# Patient Record
Sex: Female | Born: 1950
Health system: Southern US, Community
[De-identification: ages and names within clinical notes are randomized; demographics above are authoritative.]

## PROBLEM LIST (undated history)

## (undated) DIAGNOSIS — IMO0002 Reserved for concepts with insufficient information to code with codable children: Secondary | ICD-10-CM

## (undated) DIAGNOSIS — I10 Essential (primary) hypertension: Secondary | ICD-10-CM

## (undated) DIAGNOSIS — R1319 Other dysphagia: Secondary | ICD-10-CM

## (undated) DIAGNOSIS — E269 Hyperaldosteronism, unspecified: Secondary | ICD-10-CM

## (undated) DIAGNOSIS — Z91199 Patient's noncompliance with other medical treatment and regimen due to unspecified reason: Secondary | ICD-10-CM

## (undated) DIAGNOSIS — C159 Malignant neoplasm of esophagus, unspecified: Secondary | ICD-10-CM

## (undated) DIAGNOSIS — Z9119 Patient's noncompliance with other medical treatment and regimen: Secondary | ICD-10-CM

## (undated) DIAGNOSIS — E7800 Pure hypercholesterolemia, unspecified: Secondary | ICD-10-CM

## (undated) DIAGNOSIS — E876 Hypokalemia: Secondary | ICD-10-CM

## (undated) DIAGNOSIS — I639 Cerebral infarction, unspecified: Secondary | ICD-10-CM

## (undated) DIAGNOSIS — R454 Irritability and anger: Secondary | ICD-10-CM

## (undated) DIAGNOSIS — R569 Unspecified convulsions: Secondary | ICD-10-CM

## (undated) DIAGNOSIS — R928 Other abnormal and inconclusive findings on diagnostic imaging of breast: Secondary | ICD-10-CM

## (undated) DIAGNOSIS — IMO0001 Reserved for inherently not codable concepts without codable children: Secondary | ICD-10-CM

## (undated) HISTORY — DX: Other dysphagia: R13.19

## (undated) HISTORY — DX: Malignant neoplasm of esophagus, unspecified: C15.9

## (undated) HISTORY — DX: Reserved for concepts with insufficient information to code with codable children: IMO0002

## (undated) HISTORY — DX: Reserved for inherently not codable concepts without codable children: IMO0001

---

## 2014-01-31 ENCOUNTER — Encounter (HOSPITAL_BASED_OUTPATIENT_CLINIC_OR_DEPARTMENT_OTHER): Payer: Self-pay | Admitting: Emergency Medicine

## 2014-01-31 ENCOUNTER — Emergency Department (HOSPITAL_BASED_OUTPATIENT_CLINIC_OR_DEPARTMENT_OTHER)
Admission: EM | Admit: 2014-01-31 | Discharge: 2014-01-31 | Disposition: A | Discharge status: Home / Self Care | Payer: Medicare Other | Attending: Emergency Medicine | Admitting: Emergency Medicine

## 2014-01-31 DIAGNOSIS — Z8673 Personal history of transient ischemic attack (TIA), and cerebral infarction without residual deficits: Secondary | ICD-10-CM | POA: Diagnosis not present

## 2014-01-31 DIAGNOSIS — E876 Hypokalemia: Secondary | ICD-10-CM | POA: Diagnosis not present

## 2014-01-31 DIAGNOSIS — R1310 Dysphagia, unspecified: Secondary | ICD-10-CM | POA: Diagnosis present

## 2014-01-31 DIAGNOSIS — Z79899 Other long term (current) drug therapy: Secondary | ICD-10-CM | POA: Insufficient documentation

## 2014-01-31 DIAGNOSIS — I10 Essential (primary) hypertension: Secondary | ICD-10-CM | POA: Insufficient documentation

## 2014-01-31 DIAGNOSIS — Z7902 Long term (current) use of antithrombotics/antiplatelets: Secondary | ICD-10-CM | POA: Diagnosis not present

## 2014-01-31 DIAGNOSIS — Z720 Tobacco use: Secondary | ICD-10-CM | POA: Diagnosis not present

## 2014-01-31 DIAGNOSIS — B370 Candidal stomatitis: Secondary | ICD-10-CM | POA: Diagnosis not present

## 2014-01-31 DIAGNOSIS — Z7982 Long term (current) use of aspirin: Secondary | ICD-10-CM | POA: Insufficient documentation

## 2014-01-31 DIAGNOSIS — E780 Pure hypercholesterolemia: Secondary | ICD-10-CM | POA: Insufficient documentation

## 2014-01-31 DIAGNOSIS — B379 Candidiasis, unspecified: Secondary | ICD-10-CM

## 2014-01-31 HISTORY — DX: Cerebral infarction, unspecified: I63.9

## 2014-01-31 HISTORY — DX: Hypokalemia: E87.6

## 2014-01-31 HISTORY — DX: Essential (primary) hypertension: I10

## 2014-01-31 HISTORY — DX: Irritability and anger: R45.4

## 2014-01-31 HISTORY — DX: Pure hypercholesterolemia, unspecified: E78.00

## 2014-01-31 HISTORY — DX: Other abnormal and inconclusive findings on diagnostic imaging of breast: R92.8

## 2014-01-31 HISTORY — DX: Patient's noncompliance with other medical treatment and regimen due to unspecified reason: Z91.199

## 2014-01-31 HISTORY — DX: Patient's noncompliance with other medical treatment and regimen: Z91.19

## 2014-01-31 HISTORY — DX: Hyperaldosteronism, unspecified: E26.9

## 2014-01-31 LAB — CBG MONITORING, ED: GLUCOSE-CAPILLARY: 89 mg/dL (ref 70–99)

## 2014-01-31 MED ORDER — CLOTRIMAZOLE 10 MG MT TROC: 10.0000 mg | Freq: Three times a day (TID) | OROMUCOSAL | Status: DC

## 2014-01-31 NOTE — ED Provider Notes (Signed)
CSN: [id number]     Arrival date & time 01/31/14  1521 History   First MD Initiated Contact with Patient 01/31/14 1531     Chief Complaint  Patient presents with  . Sore Throat  . Dysphagia     (Consider location/radiation/quality/duration/timing/severity/associated sxs/prior Treatment) HPI Comments: Pt states that she has had difficulty with swallowing her solid food. Pt states that is burning on the right side. Denies history of reflux. No cough, fever or generalized sore throat. States that she swallows liquids without any problem  The history is provided by the patient. No language interpreter was used.    Past Medical History  Diagnosis Date  . Stroke   . Hypercholesteremia   . Hypertension   . Hypokalemia   . History of noncompliance with medical treatment   . High aldosterone   . Anger   . Mammogram abnormal    History reviewed. No pertinent past surgical history. No family history on file. History  Substance Use Topics  . Smoking status: Current Every Day Smoker  . Smokeless tobacco: Not on file  . Alcohol Use: Not on file   OB History   Grav Para Term Preterm Abortions TAB SAB Ect Mult Living                 Review of Systems  All other systems reviewed and are negative.     Allergies  Review of patient's allergies indicates not on file.  Home Medications   Prior to Admission medications   Medication Sig Start Date End Date Taking? Authorizing Provider  amLODipine (NORVASC) 10 MG tablet Take 10 mg by mouth daily.   Yes Historical Provider, MD  aspirin 325 MG tablet Take 325 mg by mouth daily.   Yes Historical Provider, MD  atorvastatin (LIPITOR) 40 MG tablet Take 40 mg by mouth daily.   Yes Historical Provider, MD  clopidogrel (PLAVIX) 75 MG tablet Take 75 mg by mouth daily.   Yes Historical Provider, MD  lisinopril (PRINIVIL,ZESTRIL) 40 MG tablet Take 40 mg by mouth daily.   Yes Historical Provider, MD  magnesium gluconate (MAGONATE) 500 MG tablet  Take 500 mg by mouth 2 (two) times daily.   Yes Historical Provider, MD  potassium chloride 40 MEQ/15ML (20%) LIQD Take 40 mEq by mouth 2 (two) times daily.   Yes Historical Provider, MD   BP 160/74  Pulse 84  Temp(Src) 98.9 F (37.2 C)  Resp 16  Ht 5\' 6"  (1.676 m)  Wt 163 lb (73.936 kg)  BMI 26.32 kg/m2  SpO2 97% Physical Exam  Nursing note and vitals reviewed. Constitutional: She is oriented to person, place, and time. She appears well-developed and well-nourished.  HENT:  Right Ear: External ear normal.  Left Ear: External ear normal.  White coating noted to the tongue with redness and inflammation noted on the right side posteriorly  Eyes: EOM are normal.  Cardiovascular: Normal rate and regular rhythm.   Pulmonary/Chest: Effort normal and breath sounds normal.  Musculoskeletal: Normal range of motion.  Neurological: She is alert and oriented to person, place, and time.    ED Course  Procedures (including critical care time) Labs Review Labs Reviewed  CBG MONITORING, ED    Imaging Review No results found.   EKG Interpretation None      MDM   Final diagnoses:  Candida infection    Exam consistent with candida. Blood sugar wnl. Discussed with pt and sister that this is unusual and she needs to be  rechecked. Could be related to poor oral care    Glendell Docker, NP 01/31/14 1623

## 2014-01-31 NOTE — ED Notes (Signed)
Pt states she has been having difficulty with swallowing solid foods.  No difficulty with fluids.  She relates she has burning sensation with eating.

## 2014-01-31 NOTE — Discharge Instructions (Signed)
Follow up with your doctor for continued or worsening symptoms, but also to make sure that the symptoms are resolving   Candida Infection A Candida infection (also called yeast, fungus, and Monilia infection) is an overgrowth of yeast that can occur anywhere on the body. A yeast infection commonly occurs in warm, moist body areas. Usually, the infection remains localized but can spread to become a systemic infection. A yeast infection may be a sign of a more severe disease such as diabetes, leukemia, or AIDS. A yeast infection can occur in both men and women. In women, Candida vaginitis is a vaginal infection. It is one of the most common causes of vaginitis. Men usually do not have symptoms or know they have an infection until other problems develop. Men may find out they have a yeast infection because their sex partner has a yeast infection. Uncircumcised men are more likely to get a yeast infection than circumcised men. This is because the uncircumcised glans is not exposed to air and does not remain as dry as that of a circumcised glans. Older adults may develop yeast infections around dentures. CAUSES  Women  Antibiotics.  Steroid medication taken for a long time.  Being overweight (obese).  Diabetes.  Poor immune condition.  Certain serious medical conditions.  Immune suppressive medications for organ transplant patients.  Chemotherapy.  Pregnancy.  Menstruation.  Stress and fatigue.  Intravenous drug use.  Oral contraceptives.  Wearing tight-fitting clothes in the crotch area.  Catching it from a sex partner who has a yeast infection.  Spermicide.  Intravenous, urinary, or other catheters. Men  Catching it from a sex partner who has a yeast infection.  Having oral or anal sex with a person who has the infection.  Spermicide.  Diabetes.  Antibiotics.  Poor immune system.  Medications that suppress the immune system.  Intravenous drug  use.  Intravenous, urinary, or other catheters. SYMPTOMS  Women  Thick, white vaginal discharge.  Vaginal itching.  Redness and swelling in and around the vagina.  Irritation of the lips of the vagina and perineum.  Blisters on the vaginal lips and perineum.  Painful sexual intercourse.  Low blood sugar (hypoglycemia).  Painful urination.  Bladder infections.  Intestinal problems such as constipation, indigestion, bad breath, bloating, increase in gas, diarrhea, or loose stools. Men  Men may develop intestinal problems such as constipation, indigestion, bad breath, bloating, increase in gas, diarrhea, or loose stools.  Dry, cracked skin on the penis with itching or discomfort.  Jock itch.  Dry, flaky skin.  Athlete's foot.  Hypoglycemia. DIAGNOSIS  Women  A history and an exam are performed.  The discharge may be examined under a microscope.  A culture may be taken of the discharge. Men  A history and an exam are performed.  Any discharge from the penis or areas of cracked skin will be looked at under the microscope and cultured.  Stool samples may be cultured. TREATMENT  Women  Vaginal antifungal suppositories and creams.  Medicated creams to decrease irritation and itching on the outside of the vagina.  Warm compresses to the perineal area to decrease swelling and discomfort.  Oral antifungal medications.  Medicated vaginal suppositories or cream for repeated or recurrent infections.  Wash and dry the irritation areas before applying the cream.  Eating yogurt with Lactobacillus may help with prevention and treatment.  Sometimes painting the vagina with gentian violet solution may help if creams and suppositories do not work. Men  Antifungal creams and  oral antifungal medications.  Sometimes treatment must continue for 30 days after the symptoms go away to prevent recurrence. HOME CARE INSTRUCTIONS  Women  Use cotton underwear and avoid  tight-fitting clothing.  Avoid colored, scented toilet paper and deodorant tampons or pads.  Do not douche.  Keep your diabetes under control.  Finish all the prescribed medications.  Keep your skin clean and dry.  Consume milk or yogurt with Lactobacillus-active culture regularly. If you get frequent yeast infections and think that is what the infection is, there are over-the-counter medications that you can get. If the infection does not show healing in 3 days, talk to your caregiver.  Tell your sex partner you have a yeast infection. Your partner may need treatment also, especially if your infection does not clear up or recurs. Men  Keep your skin clean and dry.  Keep your diabetes under control.  Finish all prescribed medications.  Tell your sex partner that you have a yeast infection so he or she can be treated if necessary. SEEK MEDICAL CARE IF:   Your symptoms do not clear up or worsen in one week after treatment.  You have an oral temperature above 102 F (38.9 C).  You have trouble swallowing or eating for a prolonged time.  You develop blisters on and around your vagina.  You develop vaginal bleeding and it is not your menstrual period.  You develop abdominal pain.  You develop intestinal problems as mentioned above.  You get weak or light-headed.  You have painful or increased urination.  You have pain during sexual intercourse. MAKE SURE YOU:   Understand these instructions.  Will watch your condition.  Will get help right away if you are not doing well or get worse. Document Released: 05/02/2004 Document Revised: 08/09/2013 Document Reviewed: 08/14/2009 [facility] Patient Information 2015 [place], [place]. This information is not intended to replace advice given to you by your health care provider. Make sure you discuss any questions you have with your health care provider.

## 2014-02-01 NOTE — ED Provider Notes (Signed)
Medical screening examination/treatment/procedure(s) were performed by non-physician practitioner and as supervising physician I was immediately available for consultation/collaboration.   EKG Interpretation None        Pamella Pert, MD 02/01/14 1146

## 2014-04-06 ENCOUNTER — Encounter: Payer: Self-pay | Admitting: Hematology & Oncology

## 2014-04-07 ENCOUNTER — Encounter: Payer: Self-pay | Admitting: Hematology & Oncology

## 2014-04-07 ENCOUNTER — Other Ambulatory Visit (HOSPITAL_BASED_OUTPATIENT_CLINIC_OR_DEPARTMENT_OTHER): Payer: Medicare Other | Admitting: Lab

## 2014-04-07 ENCOUNTER — Encounter (HOSPITAL_COMMUNITY): Payer: Self-pay

## 2014-04-07 ENCOUNTER — Ambulatory Visit: Payer: Medicare Other

## 2014-04-07 ENCOUNTER — Other Ambulatory Visit: Payer: Self-pay

## 2014-04-07 ENCOUNTER — Inpatient Hospital Stay (HOSPITAL_COMMUNITY)
Admission: AD | Admit: 2014-04-07 | Discharge: 2014-04-19 | DRG: 846 | Disposition: A | Discharge status: Nursing Home (Includes ICF) | Payer: Medicare Other | Source: Ambulatory Visit | Attending: Hematology & Oncology | Admitting: Hematology & Oncology

## 2014-04-07 ENCOUNTER — Ambulatory Visit (HOSPITAL_BASED_OUTPATIENT_CLINIC_OR_DEPARTMENT_OTHER): Payer: Medicare Other | Admitting: Hematology & Oncology

## 2014-04-07 VITALS — BP 126/77 | HR 63 | Temp 98.8°F | Resp 16 | Ht 66.0 in | Wt 141.0 lb

## 2014-04-07 DIAGNOSIS — Z7982 Long term (current) use of aspirin: Secondary | ICD-10-CM | POA: Diagnosis not present

## 2014-04-07 DIAGNOSIS — F1010 Alcohol abuse, uncomplicated: Secondary | ICD-10-CM | POA: Diagnosis present

## 2014-04-07 DIAGNOSIS — C159 Malignant neoplasm of esophagus, unspecified: Secondary | ICD-10-CM | POA: Insufficient documentation

## 2014-04-07 DIAGNOSIS — E440 Moderate protein-calorie malnutrition: Secondary | ICD-10-CM | POA: Diagnosis present

## 2014-04-07 DIAGNOSIS — Z8673 Personal history of transient ischemic attack (TIA), and cerebral infarction without residual deficits: Secondary | ICD-10-CM | POA: Diagnosis not present

## 2014-04-07 DIAGNOSIS — G9340 Encephalopathy, unspecified: Secondary | ICD-10-CM | POA: Diagnosis present

## 2014-04-07 DIAGNOSIS — E860 Dehydration: Secondary | ICD-10-CM | POA: Diagnosis present

## 2014-04-07 DIAGNOSIS — F17210 Nicotine dependence, cigarettes, uncomplicated: Secondary | ICD-10-CM | POA: Diagnosis present

## 2014-04-07 DIAGNOSIS — E43 Unspecified severe protein-calorie malnutrition: Secondary | ICD-10-CM

## 2014-04-07 DIAGNOSIS — Z7902 Long term (current) use of antithrombotics/antiplatelets: Secondary | ICD-10-CM | POA: Diagnosis not present

## 2014-04-07 DIAGNOSIS — D72829 Elevated white blood cell count, unspecified: Secondary | ICD-10-CM | POA: Diagnosis present

## 2014-04-07 DIAGNOSIS — D539 Nutritional anemia, unspecified: Secondary | ICD-10-CM | POA: Diagnosis present

## 2014-04-07 DIAGNOSIS — D630 Anemia in neoplastic disease: Secondary | ICD-10-CM | POA: Diagnosis present

## 2014-04-07 DIAGNOSIS — K769 Liver disease, unspecified: Secondary | ICD-10-CM

## 2014-04-07 DIAGNOSIS — E780 Pure hypercholesterolemia: Secondary | ICD-10-CM | POA: Diagnosis present

## 2014-04-07 DIAGNOSIS — Z79899 Other long term (current) drug therapy: Secondary | ICD-10-CM | POA: Diagnosis not present

## 2014-04-07 DIAGNOSIS — R627 Adult failure to thrive: Secondary | ICD-10-CM | POA: Diagnosis present

## 2014-04-07 DIAGNOSIS — Z87891 Personal history of nicotine dependence: Secondary | ICD-10-CM | POA: Diagnosis not present

## 2014-04-07 DIAGNOSIS — Z9119 Patient's noncompliance with other medical treatment and regimen: Secondary | ICD-10-CM | POA: Diagnosis present

## 2014-04-07 DIAGNOSIS — R1310 Dysphagia, unspecified: Secondary | ICD-10-CM | POA: Diagnosis present

## 2014-04-07 DIAGNOSIS — I10 Essential (primary) hypertension: Secondary | ICD-10-CM | POA: Diagnosis present

## 2014-04-07 DIAGNOSIS — K222 Esophageal obstruction: Secondary | ICD-10-CM | POA: Diagnosis present

## 2014-04-07 DIAGNOSIS — Z6822 Body mass index (BMI) 22.0-22.9, adult: Secondary | ICD-10-CM

## 2014-04-07 DIAGNOSIS — Z5111 Encounter for antineoplastic chemotherapy: Principal | ICD-10-CM

## 2014-04-07 DIAGNOSIS — D473 Essential (hemorrhagic) thrombocythemia: Secondary | ICD-10-CM | POA: Diagnosis present

## 2014-04-07 DIAGNOSIS — R1319 Other dysphagia: Secondary | ICD-10-CM

## 2014-04-07 DIAGNOSIS — Z931 Gastrostomy status: Secondary | ICD-10-CM | POA: Diagnosis not present

## 2014-04-07 DIAGNOSIS — E876 Hypokalemia: Secondary | ICD-10-CM | POA: Diagnosis present

## 2014-04-07 HISTORY — DX: Malignant neoplasm of esophagus, unspecified: C15.9

## 2014-04-07 HISTORY — DX: Other dysphagia: R13.19

## 2014-04-07 LAB — CBC WITH DIFFERENTIAL (CANCER CENTER ONLY)
BASO#: 0.1 10*3/uL (ref 0.0–0.2)
BASO%: 0.3 % (ref 0.0–2.0)
EOS ABS: 0.7 10*3/uL — AB (ref 0.0–0.5)
EOS%: 3.2 % (ref 0.0–7.0)
HCT: 32.9 % — ABNORMAL LOW (ref 34.8–46.6)
HEMOGLOBIN: 10.8 g/dL — AB (ref 11.6–15.9)
LYMPH#: 2.5 10*3/uL (ref 0.9–3.3)
LYMPH%: 11.9 % — ABNORMAL LOW (ref 14.0–48.0)
MCH: 28.1 pg (ref 26.0–34.0)
MCHC: 32.8 g/dL (ref 32.0–36.0)
MCV: 86 fL (ref 81–101)
MONO#: 1.3 10*3/uL — ABNORMAL HIGH (ref 0.1–0.9)
MONO%: 6.1 % (ref 0.0–13.0)
NEUT%: 78.5 % (ref 39.6–80.0)
NEUTROS ABS: 16.4 10*3/uL — AB (ref 1.5–6.5)
Platelets: 466 10*3/uL — ABNORMAL HIGH (ref 145–400)
RBC: 3.84 10*6/uL (ref 3.70–5.32)
RDW: 14.7 % (ref 11.1–15.7)
WBC: 20.9 10*3/uL — ABNORMAL HIGH (ref 3.9–10.0)

## 2014-04-07 LAB — COMPREHENSIVE METABOLIC PANEL
ALBUMIN: 3.6 g/dL (ref 3.5–5.2)
ALT: <8 U/L (ref 0–35)
AST: 10 U/L (ref 0–37)
Alkaline Phosphatase: 59 U/L (ref 39–117)
BUN: 15 mg/dL (ref 6–23)
CO2: 39 meq/L — AB (ref 19–32)
Calcium: 10.7 mg/dL — ABNORMAL HIGH (ref 8.4–10.5)
Chloride: 94 mEq/L — ABNORMAL LOW (ref 96–112)
Creatinine, Ser: 0.97 mg/dL (ref 0.50–1.10)
GLUCOSE: 125 mg/dL — AB (ref 70–99)
Potassium: 2.5 mEq/L — CL (ref 3.5–5.3)
SODIUM: 143 meq/L (ref 135–145)
TOTAL PROTEIN: 7.3 g/dL (ref 6.0–8.3)
Total Bilirubin: 0.5 mg/dL (ref 0.2–1.2)

## 2014-04-07 LAB — PREALBUMIN: Prealbumin: 15.2 mg/dL — ABNORMAL LOW (ref 17.0–34.0)

## 2014-04-07 LAB — FERRITIN CHCC: Ferritin: 345 ng/ml — ABNORMAL HIGH (ref 9–269)

## 2014-04-07 LAB — IRON AND TIBC CHCC
%SAT: 14 % — ABNORMAL LOW (ref 21–57)
Iron: 33 ug/dL — ABNORMAL LOW (ref 41–142)
TIBC: 235 ug/dL — AB (ref 236–444)
UIBC: 202 ug/dL (ref 120–384)

## 2014-04-07 LAB — RETICULOCYTES (CHCC)
ABS RETIC: 43.3 10*3/uL (ref 19.0–186.0)
RBC.: 3.94 MIL/uL (ref 3.87–5.11)
Retic Ct Pct: 1.1 % (ref 0.4–2.3)

## 2014-04-07 MED ORDER — DEXTROSE-NACL 5-0.9 % IV SOLN
INTRAVENOUS | Status: DC
  Administered 2014-04-07: 18:00:00 via INTRAVENOUS

## 2014-04-07 MED ORDER — BOOST / RESOURCE BREEZE PO LIQD
1.0000 | Freq: Three times a day (TID) | ORAL | Status: DC
  Administered 2014-04-07 – 2014-04-11 (×10): 1 via ORAL

## 2014-04-07 MED ORDER — MAGNESIUM GLUCONATE 500 MG PO TABS
500.0000 mg | ORAL_TABLET | Freq: Two times a day (BID) | ORAL | Status: DC
  Administered 2014-04-07 – 2014-04-18 (×20): 500 mg via ORAL
  Filled 2014-04-07 (×23): qty 1

## 2014-04-07 MED ORDER — KCL IN DEXTROSE-NACL 40-5-0.9 MEQ/L-%-% IV SOLN
INTRAVENOUS | Status: DC
  Administered 2014-04-07: 21:00:00 via INTRAVENOUS
  Administered 2014-04-08: 1000 mL via INTRAVENOUS
  Administered 2014-04-09 – 2014-04-16 (×9): via INTRAVENOUS
  Administered 2014-04-17: 1000 mL via INTRAVENOUS
  Filled 2014-04-07 (×21): qty 1000

## 2014-04-07 MED ORDER — AMLODIPINE BESYLATE 10 MG PO TABS
10.0000 mg | ORAL_TABLET | Freq: Every day | ORAL | Status: DC
  Administered 2014-04-07 – 2014-04-19 (×13): 10 mg via ORAL
  Filled 2014-04-07 (×13): qty 1

## 2014-04-07 MED ORDER — THIAMINE HCL 100 MG/ML IJ SOLN
100.0000 mg | Freq: Every day | INTRAMUSCULAR | Status: DC
  Administered 2014-04-07 – 2014-04-09 (×3): 100 mg via INTRAVENOUS
  Filled 2014-04-07 (×3): qty 1

## 2014-04-07 MED ORDER — ENOXAPARIN SODIUM 40 MG/0.4ML ~~LOC~~ SOLN
40.0000 mg | SUBCUTANEOUS | Status: DC
  Administered 2014-04-07 – 2014-04-18 (×9): 40 mg via SUBCUTANEOUS
  Filled 2014-04-07 (×13): qty 0.4

## 2014-04-07 MED ORDER — POTASSIUM CHLORIDE 20 MEQ/15ML (10%) PO SOLN
20.0000 meq | Freq: Two times a day (BID) | ORAL | Status: DC
  Administered 2014-04-07: 20 meq via ORAL
  Filled 2014-04-07 (×3): qty 15

## 2014-04-07 MED ORDER — FOLIC ACID 5 MG/ML IJ SOLN
1.0000 mg | Freq: Every day | INTRAMUSCULAR | Status: DC
  Administered 2014-04-07 – 2014-04-09 (×3): 1 mg via INTRAVENOUS
  Filled 2014-04-07 (×4): qty 0.2

## 2014-04-07 MED ORDER — ATORVASTATIN CALCIUM 40 MG PO TABS
40.0000 mg | ORAL_TABLET | Freq: Every day | ORAL | Status: DC
  Administered 2014-04-07 – 2014-04-18 (×10): 40 mg via ORAL
  Filled 2014-04-07 (×13): qty 1

## 2014-04-07 MED ORDER — POTASSIUM CHLORIDE 40 MEQ/15ML (20%) PO LIQD: 20.0000 meq | Freq: Two times a day (BID) | ORAL | Status: DC

## 2014-04-07 MED ORDER — PANTOPRAZOLE SODIUM 40 MG IV SOLR
40.0000 mg | INTRAVENOUS | Status: DC
  Administered 2014-04-07 – 2014-04-08 (×2): 40 mg via INTRAVENOUS
  Filled 2014-04-07 (×3): qty 40

## 2014-04-07 MED ORDER — SODIUM CHLORIDE 0.9 % IV SOLN
1020.0000 mg | Freq: Once | INTRAVENOUS | Status: AC
  Administered 2014-04-07: 1020 mg via INTRAVENOUS
  Filled 2014-04-07: qty 34

## 2014-04-07 MED ORDER — OXYCODONE HCL 20 MG/ML PO CONC: 5.0000 mg | ORAL | Status: DC | PRN

## 2014-04-07 MED ORDER — LISINOPRIL 40 MG PO TABS
40.0000 mg | ORAL_TABLET | Freq: Every day | ORAL | Status: DC
  Administered 2014-04-07 – 2014-04-19 (×13): 40 mg via ORAL
  Filled 2014-04-07 (×13): qty 1

## 2014-04-07 NOTE — H&P (Signed)
[facility]  [phone]    ADMISSION NOTE  Admitting MD:  Allen Kell  Attending MD:  Allen Kell  Reason for Admission: Esophageal Cancer  HPI: Sheila Young is an 63 y.o. female with a new diagnosis of Squamous Cell Carcinoma of the Esophagus being admitted for initiation of treatment as patient is symptomatic for severe dysphagia.  In review, she presented to her physician with a two-month history of progressive dysphagia,especially with solids, with subsequent 25 pound weight loss. She has been able to tolerate liquids. She has increase saliva production. She denies any vomiting, but she does report intermittent nausea She denies any shortness of breath, cough or cardiac complaints. No bleeding issues such as hemoptysis or hematemesis. She denies any pain at this time. She denies any fever. he patient has a history of Prior CVA,Tobacco and alcohol abuse. She was seen by ENT there referred for upper endoscopy, remarkable for a GI mass seen at the esophagus. EGD on December 23 showed this mass to be 30 cm down to the EG junction. Biopsies were taken, and the Esophagus was dilated. Pathology reports, case number [id number] was consistent with squamous cell carcinoma. She then was referred to the office of Dr. Marin Olp the [facility] at [facility] for further evaluation. He recommended that the patient be admitted for appropriate staging studies, feeding tube placement and lab work. Her ECOG is 2  PMH:  Past Medical History  Diagnosis Date  . Stroke   . Hypercholesteremia   . Hypertension   . Hypokalemia   . History of noncompliance with medical treatment   . High aldosterone   . Anger   . Mammogram abnormal   . Esophagus cancer 04/07/2014  . Mechanical dysphagia 04/07/2014    Surgeries:  No past surgical history on file.  Allergies: No Known Allergies  Medications:   Prior to Admission:  Prescriptions prior to admission    Medication Sig Dispense Refill Last Dose  . amLODipine (NORVASC) 10 MG tablet Take 10 mg by mouth daily.   Taking  . aspirin 325 MG tablet Take 325 mg by mouth daily.   Taking  . atorvastatin (LIPITOR) 40 MG tablet Take 40 mg by mouth daily.   Taking  . clopidogrel (PLAVIX) 75 MG tablet Take 75 mg by mouth daily.   Taking  . lisinopril (PRINIVIL,ZESTRIL) 40 MG tablet Take 40 mg by mouth daily.   Taking  . magnesium gluconate (MAGONATE) 500 MG tablet Take 500 mg by mouth 2 (two) times daily.   Taking  . potassium chloride 40 MEQ/15ML (20%) LIQD Take 40 mEq by mouth 2 (two) times daily.   Taking    PRN:    KZS:WFUXNA of Systems  Constitutional: Positive for 25 pound weight loss. Negative for fever, chills and positive for malaise/fatigue.  Eyes: Negative for blurred vision and double vision.  Respiratory: Negative for cough, hemoptysis orshortness of breath.  Cardiovascular: Negativefor chest pain. GI: She reports intermittent nausea without vomiting, diarrhea or constipation. No change in bowel caliber. No Melena or Hematochezia.  GU: No blood in urine. No loss of urinary control. Skin: Negative for itching. No rash. No petechia. No bruising. Neurological: No headaches. No motor or sensory deficits. She has a history of CVA without residual Review of all other organs systems are otherwise negative  Family History:  No family history on file.  Social History:  reports that she has been smoking Cigarettes.  She started smoking about 45 years ago. She  has a 22.5 pack-year smoking history. She has never used smokeless tobacco. Antigen 1 week ago she drunk heavily current and beer.  Physical Exam:   There were no vitals filed for this visit.  63 y.o. female  in no acute distress, alert, Non conversant due to symptoms, which include increased salivary production. She appears very anxious General well-developed,thin HEENT: Normocephalic, atraumatic, PERRLA. Oral cavity without thrush or  lesions. Neck supple. no thyromegaly, no cervical or supraclavicular adenopathy  Lungs clear bilaterally . No wheezing, rhonchi or rales. Breasts: not examined. Cardiac regular rate and rhythm, no murmur,no rubs or gallops. Occasional ectopic beat Abdomen soft nontender,bowel sounds x4. No organomegaly GU/rectal: deferred. Extremities no clubbing cyanosis or edema. No bruising or petechial rash Neuro: non focal    LABS: CBC   Recent Labs Lab 04/07/14 1310  WBC 20.9*  HGB 10.8*  HCT 32.9*  PLT 466*  MCV 86  MCH 28.1  MCHC 32.8  RDW 14.7  LYMPHSABS 2.5  EOSABS 0.7*  BASOSABS 0.1     Anemia panel:  No results for input(s): VITAMINB12, FOLATE, FERRITIN, TIBC, IRON, RETICCTPCT in the last 72 hours.  No results for input(s): TSH, T4TOTAL, T3FREE, THYROIDAB in the last 72 hours.  Invalid input(s): FREET3   No results found for: ESRSEDRATE    CMP   No results for input(s): NA, K, CL, CO2, GLUCOSE, BUN, CREATININE, CALCIUM, MG, AST, ALT, ALKPHOS, BILITOT in the last 168 hours.  Invalid input(s): GFRCGP   No results found for: BILITOT, BILIDIR, IBILI    No results for input(s): INR, PROTIME in the last 168 hours.  No results for input(s): DDIMER in the last 72 hours.    Imaging Studies: No results found.      A/P: 63 y.o. female  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 Dysphagia The patient has been recently diagnosed, she presented with dysphagia which required further workup.  EGD demonstrated an esophageal mass, for which biopsies were taken. This was consistent with squamous cell carcinoma. She is being admitted for further workup, which will include a CT of the chest, abdomen and pelvis, Possible MRI of the brain. PET scan will be performed at an outpatient. The plan is to place a Port-A-Cath, feeding tube, in preparation for combined planned chemotherapy and radiation Will need interventional radiology evaluation We will check a CBC in  the morning, B Met, may need tumor markers as well  3. Malnutrition Secondary to #1 #2 Consider nutrition evaluation while in the hospital the patient has lost 25 pounds in only 2 months  4.Leukocytosis Secondary to pain, inflammation, dehydration and history of tobacco abuse.  The patient is afebrile We will provide supportive care at this time with IV fluids, pain meds Tobacco cessation is recommended  5. Anemia Secondary to malnutrition, malignancy No bleeding issues are noted No transfusion is indicated at this time We will consider anemia panel  6. Thrombocytosis Likely reactive, in the setting of anemia and malignancy history of CVA, in the form of cerebral artery occlusion.This was on 07/28/2013 At [facility];. We will try to obtain documents to further evaluate, as the patient is experiencing thrombocytosis, and to prevent further events Platelet count is 466,000 at this time Will hydrate, and monitor closely The patient was on aspirin until admission, will continue med The patient will be placed on DVT prophylaxis with Lovenox upon admission  7. History of Alcohol abuse The patient has not had consumption for about 1 week due to  these symptoms Monitor for DT She will receive Thiamine and Folic Acid.   8. Full code   [facility] E 04/07/2014 3:38 PM   ADDENDUM:  I saw and examined the patient. I agree with the above.  We will have to see what the scans show.  I don't know if radiology or if GI can put the feeding tube in for [place].    She will also be a Port-A-Cath. I suppose this probably will not can put in until Monday.  She feels better right now. I think the obvious fluids are helping.  She does have some thrombocytosis. I suspect that she probably has some iron deficiency.  Once we get the staging studies done, then we will know how we can best treat this.  She just is not able to do the workup or procedures as an outpatient because she is  somewhat feeble.  She, and her family, appreciate the wonderful care that she has gotten so far. They have told us that they are not used to such personal attention that they are getting from the staff on [place].

## 2014-04-07 NOTE — Progress Notes (Signed)
Referral MD  Reason for Referral: Squamous cell carcinoma of the esophagus   Chief Complaint  Patient presents with  . NEW PATIENT  : She does not know why she is here  HPI: Mrs. Clarida is a very nice but very emotional 63 year old African-American female. She has had progressive dysphagia over the past 2 months. She came in with some of her family. Her 3 sisters were with her. Mrs. Alcorta really will not talk much area and she has a lot of saliva production.  She's lost about 25 pounds over a two-month period. She was not really able to enjoy food over Thanksgiving or Christmas.  She does have a history of heavy tobacco use. She really is not saying how much she smokes. I would have to say that she probably has at least a 50-pack-year history of tobacco use.  She also has a history of alcohol use. She does drink heavy liquor and beer. Last time she had a drink that her family thinks was about a week ago.  She was seen by gastroenterology in [facility]. Dr. [place] did an upper endoscopy on her. Again this was the cause of her dysphagia. He had done an upper GI on her. The upper GI showed a mass in the esophagus.  An EGD was subsequently done. This was done on December 23. There was a mass from 30 cm down to the EG junction. The scope was able to be passed through. Biopsies were taken. The esophagus was dilated.  The pathology report ([id number]) showed that she had a squamous cell carcinoma.  No additional testing has been done.  She is able to take in some liquids. It does not sound like she taking any solids.  I think she probably is going to have to be admitted so that she can get a feeding tube placed and is appropriate staging studies done.  She has had a past history of CVA. Her family thinks last stroke was about 10 years ago.  There's been no bleeding. She's had no hemoptysis or hematemesis. She is not complaining of any pain. There is no fever. She's had no cough.  She's had no vomiting. She has had some nausea.  Currently, her performance status is ECOG 2.             Past Medical History  Diagnosis Date  . Stroke   . Hypercholesteremia   . Hypertension   . Hypokalemia   . History of noncompliance with medical treatment   . High aldosterone   . Anger   . Mammogram abnormal   :  No past surgical history on file.:  Current outpatient prescriptions: amLODipine (NORVASC) 10 MG tablet, Take 10 mg by mouth daily., Disp: , Rfl: ;  aspirin 325 MG tablet, Take 325 mg by mouth daily., Disp: , Rfl: ;  atorvastatin (LIPITOR) 40 MG tablet, Take 40 mg by mouth daily., Disp: , Rfl: ;  clopidogrel (PLAVIX) 75 MG tablet, Take 75 mg by mouth daily., Disp: , Rfl: ;  lisinopril (PRINIVIL,ZESTRIL) 40 MG tablet, Take 40 mg by mouth daily., Disp: , Rfl:  magnesium gluconate (MAGONATE) 500 MG tablet, Take 500 mg by mouth 2 (two) times daily., Disp: , Rfl: ;  potassium chloride 40 MEQ/15ML (20%) LIQD, Take 40 mEq by mouth 2 (two) times daily., Disp: , Rfl: :  :  No Known Allergies:  No family history on file.:  History   Social History  . Marital Status: Single  Spouse Name: N/A    Number of Children: N/A  . Years of Education: N/A   Occupational History  . Not on file.   Social History Main Topics  . Smoking status: Current Every Day Smoker -- 0.50 packs/day for 45 years    Types: Cigarettes    Start date: 06/05/1968  . Smokeless tobacco: Never Used     Comment: 03-2014 still smoking  . Alcohol Use: Not on file  . Drug Use: Not on file  . Sexual Activity: Not on file   Other Topics Concern  . Not on file   Social History Narrative  :  Pertinent items are noted in HPI.  Exam: @IPVITALS @  thin but fairly well-nourished [place] American female in no obvious distress. Vital signs show temperature of 98.8. Pulse 83. Blood pressure 126/77. Weight is 141 pounds. Head and neck exam shows no ocular or oral lesions. There are no palpable  cervical or supraclavicular lymph nodes. Lungs are clear bilaterally. She has no rales, wheezes or rhonchi. Cardiac exam regular rate and rhythm. She has occasional extra beat. There are no murmurs, rubs or bruits. Abdomen is soft. She has a keloid formation in the central midline. She will not say how she got this. There is no palpable liver or spleen tip. Back exam no tenderness over the spine, ribs or hips. Extremities shows no clubbing, cyanosis or edema. She has decent strength in her extremities. Skin exam is slightly dry.    Recent Labs  04/07/14 1310  WBC 20.9*  HGB 10.8*  HCT 32.9*  PLT 466*   No results for input(s): NA, K, CL, CO2, GLUCOSE, BUN, CREATININE, CALCIUM in the last 72 hours.  Blood smear review: None  Pathology: See above     Assessment and Plan: Ms. Chasen is a 63 year old African-American female with squamous cell carcinoma esophagus. Again, we have no idea what the stages. She seems to be having a hard time eating.  I think she will need to have a feeding tube placed.  I would have to think that she probably has locally advanced disease. I suppose that she may have metastatic disease.  Again, she probably needs to be admitted. Once she is admitted, that we can do the appropriate staging studies with CT scan and possible MRI. We cannot do a PET scan as an inpatient.  She will be a feeding tube. She will need a Port-A-Cath.  As far as therapy goes, I suspect that combined chemotherapy and radiation therapy will be indicated.  I spent about an hour with Mrs. Stys and her family. They understand why she needs to be admitted. They understand why we need to do the appropriate staging studies. They understand why she needs a feeding tube.  We will plan to get her back to see [place] once her inpatient hospitalization is complete.

## 2014-04-08 ENCOUNTER — Inpatient Hospital Stay (HOSPITAL_COMMUNITY): Payer: Medicare Other

## 2014-04-08 DIAGNOSIS — R1319 Other dysphagia: Secondary | ICD-10-CM

## 2014-04-08 DIAGNOSIS — D649 Anemia, unspecified: Secondary | ICD-10-CM

## 2014-04-08 DIAGNOSIS — C159 Malignant neoplasm of esophagus, unspecified: Secondary | ICD-10-CM

## 2014-04-08 DIAGNOSIS — D72829 Elevated white blood cell count, unspecified: Secondary | ICD-10-CM

## 2014-04-08 DIAGNOSIS — E46 Unspecified protein-calorie malnutrition: Secondary | ICD-10-CM

## 2014-04-08 DIAGNOSIS — D473 Essential (hemorrhagic) thrombocythemia: Secondary | ICD-10-CM

## 2014-04-08 LAB — COMPREHENSIVE METABOLIC PANEL
ALBUMIN: 3.3 g/dL — AB (ref 3.5–5.2)
ALT: 9 U/L (ref 0–35)
AST: 13 U/L (ref 0–37)
Alkaline Phosphatase: 62 U/L (ref 39–117)
Anion gap: 9 (ref 5–15)
BUN: 9 mg/dL (ref 6–23)
CHLORIDE: 95 meq/L — AB (ref 96–112)
CO2: 36 mmol/L — ABNORMAL HIGH (ref 19–32)
Calcium: 9.4 mg/dL (ref 8.4–10.5)
Creatinine, Ser: 0.68 mg/dL (ref 0.50–1.10)
GFR calc Af Amer: >90 mL/min (ref >90)
GFR calc non Af Amer: >90 mL/min (ref >90)
Glucose, Bld: 125 mg/dL — ABNORMAL HIGH (ref 70–99)
Potassium: <2 mmol/L — CL (ref 3.5–5.1)
Sodium: 140 mmol/L (ref 135–145)
Total Bilirubin: 1.1 mg/dL (ref 0.3–1.2)
Total Protein: 6.9 g/dL (ref 6.0–8.3)

## 2014-04-08 LAB — BASIC METABOLIC PANEL
ANION GAP: 10 (ref 5–15)
BUN: 8 mg/dL (ref 6–23)
CALCIUM: 9.6 mg/dL (ref 8.4–10.5)
CO2: 36 mmol/L — ABNORMAL HIGH (ref 19–32)
Chloride: 96 mEq/L (ref 96–112)
Creatinine, Ser: 0.65 mg/dL (ref 0.50–1.10)
GFR calc non Af Amer: >90 mL/min (ref >90)
Glucose, Bld: 147 mg/dL — ABNORMAL HIGH (ref 70–99)
Sodium: 142 mmol/L (ref 135–145)

## 2014-04-08 LAB — LIPID PANEL
CHOL/HDL RATIO: 4.5 ratio
Cholesterol: 113 mg/dL (ref 0–200)
HDL: 25 mg/dL — AB (ref >39)
LDL CALC: 43 mg/dL (ref 0–99)
TRIGLYCERIDES: 225 mg/dL — AB (ref <150)
VLDL: 45 mg/dL — ABNORMAL HIGH (ref 0–40)

## 2014-04-08 LAB — CBC
HEMATOCRIT: 31.4 % — AB (ref 36.0–46.0)
Hemoglobin: 10.2 g/dL — ABNORMAL LOW (ref 12.0–15.0)
MCH: 27.8 pg (ref 26.0–34.0)
MCHC: 32.5 g/dL (ref 30.0–36.0)
MCV: 85.6 fL (ref 78.0–100.0)
PLATELETS: 415 10*3/uL — AB (ref 150–400)
RBC: 3.67 MIL/uL — AB (ref 3.87–5.11)
RDW: 14.9 % (ref 11.5–15.5)
WBC: 19.2 10*3/uL — AB (ref 4.0–10.5)

## 2014-04-08 LAB — APTT: aPTT: 33 seconds (ref 24–37)

## 2014-04-08 LAB — POTASSIUM: Potassium: 2.7 mmol/L — CL (ref 3.5–5.1)

## 2014-04-08 LAB — PROTIME-INR
INR: 1.16 (ref 0.00–1.49)
PROTHROMBIN TIME: 14.9 s (ref 11.6–15.2)

## 2014-04-08 LAB — MAGNESIUM: Magnesium: 1.5 mg/dL (ref 1.5–2.5)

## 2014-04-08 MED ORDER — POTASSIUM CHLORIDE 20 MEQ/15ML (10%) PO SOLN
40.0000 meq | Freq: Two times a day (BID) | ORAL | Status: DC
  Administered 2014-04-08 – 2014-04-10 (×6): 40 meq via ORAL
  Filled 2014-04-08 (×9): qty 30

## 2014-04-08 MED ORDER — IOHEXOL 300 MG/ML  SOLN
100.0000 mL | Freq: Once | INTRAMUSCULAR | Status: AC | PRN
  Administered 2014-04-08: 100 mL via INTRAVENOUS

## 2014-04-08 MED ORDER — POTASSIUM CHLORIDE CRYS ER 20 MEQ PO TBCR: 40.0000 meq | EXTENDED_RELEASE_TABLET | Freq: Two times a day (BID) | ORAL | Status: DC

## 2014-04-08 MED ORDER — MAGNESIUM SULFATE 2 GM/50ML IV SOLN
2.0000 g | Freq: Once | INTRAVENOUS | Status: AC
  Administered 2014-04-08: 2 g via INTRAVENOUS
  Filled 2014-04-08: qty 50

## 2014-04-08 MED ORDER — POTASSIUM CHLORIDE 10 MEQ/100ML IV SOLN
10.0000 meq | INTRAVENOUS | Status: AC
  Administered 2014-04-08 – 2014-04-09 (×4): 10 meq via INTRAVENOUS
  Filled 2014-04-08 (×4): qty 100

## 2014-04-08 MED ORDER — POTASSIUM CHLORIDE 20 MEQ/15ML (10%) PO SOLN
40.0000 meq | Freq: Once | ORAL | Status: AC
  Administered 2014-04-08: 40 meq via ORAL
  Filled 2014-04-08: qty 30

## 2014-04-08 MED ORDER — POTASSIUM CHLORIDE 10 MEQ/100ML IV SOLN
10.0000 meq | INTRAVENOUS | Status: AC
  Administered 2014-04-08 (×6): 10 meq via INTRAVENOUS
  Filled 2014-04-08 (×6): qty 100

## 2014-04-08 NOTE — Progress Notes (Signed)
CRITICAL VALUE ALERT  Critical value received:  K 2.7  Date of notification:  04/08/14  Time of notification:  2155  Critical value read back: yes  Nurse who received alert:  Gwen Pounds  MD notified (1st page):  On-call, Ennever  Time of first page:  2200  MD notified (2nd page):  Time of second page:  Responding MD:  Marin Olp  Time MD responded: 2228, new orders given

## 2014-04-08 NOTE — Progress Notes (Signed)
Pt alert and oriented. K <2., Dr. Marin Olp aware. Orders given. K redrawn at noon- remains < 2. Dr. Marin Olp called orders given. CT scan done. K to be drawn again after last dose of KCL given IV. Magnesium -1.5. Night nurse to call Dr. Marin Olp the results of the magnesium and K level. Pt has been asymptomatic of the low K level. Dr. Marin Olp called about pt going to a telemetry unit, he states he doesn't do that,call hospitalist. There is no hospitalist assigned to pt- Dr. Marin Olp is attending. Charge nurse informed and he called [facility]. Denice Paradise states as long as pt is asymptomic he felt it was all right to keep pt on the floor.

## 2014-04-08 NOTE — Progress Notes (Signed)
IR team aware of request for both Port and G-tube. Chart reviewed of this 64 yo female with esophageal cancer. Currently tolerating some liquids, but anticipated radiation therapy. Noted staging scans ordered. IR team will follow along and coordinate placement of port/g-tube hopefully beginning of next week.  Ascencion Dike PA-C Interventional Radiology 04/08/2014 11:29 AM

## 2014-04-08 NOTE — Progress Notes (Signed)
Mrs. Sheila Young is feeling better. I think IV fluids are helping.  She is able to have some clear liquids. She's not having a lot of saliva. She's having no vomiting. We will try to increase her intake to full liquids.  She's on IV fluids with potassium. Potassium this morning came back less than 2. We will start her on some oral potassium area and I still am not sure that I believe that this is the actual value. We will recheck a potassium in about 4 hours.  I did go ahead and order some IV iron.  She will have her scans done today. This will help [place] decide how to best treat her.  I still think she needs a feeding tube in. I suspect that we probably will be using both radiation and chemotherapy to treat her esophageal cancer. I think even if she has metastatic disease, reprise still need radiation therapy for better local control and improvement of her dysphagia.  She's not complaining of any pain.  There is no diarrhea.  Her vital signs all stable. Blood pressure 145/75. Temp 99. Pulse 65. Her head and neck exam showed no ocular or oral lesions. There are no palpable cervical or supraclavicular lymph nodes. She has no mucositis. Lungs are clear. Cardiac exam regular rate and rhythm. Abdomen is soft. Bowel sounds are active. There is no guarding or rebound tenderness. There is no palpable liver or spleen tip. External shows no clubbing, cyanosis or edema.  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 She came in with dehydration. She came in with dysphasia. Again, IV fluids are helping.  We need to stage her. It would be nice to get a PET scan but since she is an inpatient, we cannot do this.  We will have to monitor her potassium.  Hopefully, she will do well with full liquids.  She just looks better. Again, I think the IV fluids are helping quite a bit.  I appreciate all the great care that she is getting from the staff on [address].  Pete  Psalm 27:1

## 2014-04-08 NOTE — Progress Notes (Signed)
Critical lab value potassium less than 2.0. Dr. Marin Olp office notified and message left. No new orders at this time. Will continue to monitor.

## 2014-04-09 DIAGNOSIS — D630 Anemia in neoplastic disease: Secondary | ICD-10-CM

## 2014-04-09 DIAGNOSIS — E43 Unspecified severe protein-calorie malnutrition: Secondary | ICD-10-CM | POA: Insufficient documentation

## 2014-04-09 LAB — COMPREHENSIVE METABOLIC PANEL
ALBUMIN: 3.2 g/dL — AB (ref 3.5–5.2)
ALK PHOS: 62 U/L (ref 39–117)
ALT: 9 U/L (ref 0–35)
AST: 13 U/L (ref 0–37)
Anion gap: 7 (ref 5–15)
BILIRUBIN TOTAL: 0.8 mg/dL (ref 0.3–1.2)
CO2: 33 mmol/L — ABNORMAL HIGH (ref 19–32)
CREATININE: 0.66 mg/dL (ref 0.50–1.10)
Calcium: 9.9 mg/dL (ref 8.4–10.5)
Chloride: 102 mEq/L (ref 96–112)
GFR calc Af Amer: >90 mL/min (ref >90)
GFR calc non Af Amer: >90 mL/min (ref >90)
GLUCOSE: 120 mg/dL — AB (ref 70–99)
Potassium: 3.4 mmol/L — ABNORMAL LOW (ref 3.5–5.1)
Sodium: 142 mmol/L (ref 135–145)
Total Protein: 6.7 g/dL (ref 6.0–8.3)

## 2014-04-09 LAB — CBC
HCT: 33 % — ABNORMAL LOW (ref 36.0–46.0)
Hemoglobin: 10.4 g/dL — ABNORMAL LOW (ref 12.0–15.0)
MCH: 27.3 pg (ref 26.0–34.0)
MCHC: 31.5 g/dL (ref 30.0–36.0)
MCV: 86.6 fL (ref 78.0–100.0)
Platelets: 428 10*3/uL — ABNORMAL HIGH (ref 150–400)
RBC: 3.81 MIL/uL — ABNORMAL LOW (ref 3.87–5.11)
RDW: 15.3 % (ref 11.5–15.5)
WBC: 18.2 10*3/uL — ABNORMAL HIGH (ref 4.0–10.5)

## 2014-04-09 LAB — MAGNESIUM: Magnesium: 1.8 mg/dL (ref 1.5–2.5)

## 2014-04-09 MED ORDER — VITAMIN B-1 100 MG PO TABS
100.0000 mg | ORAL_TABLET | Freq: Every day | ORAL | Status: DC
  Administered 2014-04-10 – 2014-04-11 (×2): 100 mg via ORAL
  Filled 2014-04-09 (×3): qty 1

## 2014-04-09 MED ORDER — FOLIC ACID 1 MG PO TABS
1.0000 mg | ORAL_TABLET | Freq: Every day | ORAL | Status: DC
  Administered 2014-04-10 – 2014-04-11 (×2): 1 mg via ORAL
  Filled 2014-04-09 (×3): qty 1

## 2014-04-09 MED ORDER — PANTOPRAZOLE SODIUM 40 MG PO PACK
40.0000 mg | PACK | Freq: Every day | ORAL | Status: DC
  Administered 2014-04-09 – 2014-04-11 (×3): 40 mg via ORAL
  Filled 2014-04-09 (×4): qty 20

## 2014-04-09 MED ORDER — POTASSIUM CHLORIDE 10 MEQ/50ML IV SOLN: 10.0000 meq | INTRAVENOUS | Status: DC

## 2014-04-09 MED ORDER — POTASSIUM CHLORIDE 10 MEQ/100ML IV SOLN
10.0000 meq | INTRAVENOUS | Status: AC
  Administered 2014-04-09 (×2): 10 meq via INTRAVENOUS
  Filled 2014-04-09 (×2): qty 100

## 2014-04-09 NOTE — Progress Notes (Signed)
INITIAL NUTRITION ASSESSMENT  DOCUMENTATION CODES Per approved criteria  -Severe malnutrition in the context of chronic illness  Pt meets criteria for severe MALNUTRITION in the context of chronic illness as evidenced by energy intake <75% for >1 month and 15% weight loss x 2 months.  INTERVENTION: -Consider SLP consult if swallowing issues continue -Continue Resource Breeze po TID, each supplement provides 250 kcal and 9 grams of protein -Provide Magic cup BID with meals, each supplement provides 290 kcal and 9 grams of protein -Encourage PO intake  -If nutrition support initiated, Recommend Osmolite 1.5, initiated at 15 ml/hr, increase by 10 ml every 4 hours to reach goal rate of 55 ml/hr.  -30 ml Prostat daily -TF regimen provides 2080 kcal (100% of needs), 98g of protein (100% of needs), and 1006 ml of free water. Pt will need an additional 894 ml of free water. -RD to continue to monitor  NUTRITION DIAGNOSIS: Inadequate oral intake related to dysphagia as evidenced by poor PO intake x 2 months.   Goal: Pt to meet >/= 90% of their estimated nutrition needs   Monitor:  PO and supplemental intake, weight, labs, I/O's  Reason for Assessment: Consult for nutritional assessment  Admitting Dx: Esophageal Cancer  ASSESSMENT: 64 y.o. female with a new diagnosis of Squamous Cell Carcinoma of the Esophagus being admitted for initiation of treatment as patient is symptomatic for severe dysphagia.  In review, she presented to her physician with a two-month history of progressive dysphagia,especially with solids, with subsequent 25 pound weight loss. She has been able to tolerate liquids.  Pt reports feeling very hungry this am. PO intake: 100%. Pt states that she is drinking the [employer] that is provided. Would like to continue to receive supplement. RD to order magic cups with meals BID.  PTA pt states that she was able to eat meats like chicken, liver and fish but not much  else.  Per H&P, pt has been experiencing dysphagia x 2 months. Pt states that her swallowing function has improved since admission.  Recommend SLP consult if swallowing issues arise again.  Pt reports UBW of 163 lb. Pt has had 24 lb weight loss since 10/26 (15% weight loss x 2 months).  Per MD note, pt may need nutrition support. RD to provide recommendations, if needed.  Labs reviewed: Low K & BUN  Height: Ht Readings from Last 1 Encounters:  04/07/14 5\' 6"  (1.676 m)    Weight: Wt Readings from Last 1 Encounters:  04/09/14 139 lb 11.2 oz (63.368 kg)    Ideal Body Weight: 130 lb  % Ideal Body Weight: 107%  Wt Readings from Last 10 Encounters:  04/09/14 139 lb 11.2 oz (63.368 kg)  04/07/14 141 lb (63.957 kg)  01/31/14 163 lb (73.936 kg)    Usual Body Weight: 163 lb  % Usual Body Weight: 85%  BMI:  Body mass index is 22.56 kg/(m^2).  Estimated Nutritional Needs: Kcal: 1900-2100 Protein: 95-105g Fluid: 1.9L/day  Skin: intact  Diet Order: Diet full liquid  EDUCATION NEEDS: -No education needs identified at this time   Intake/Output Summary (Last 24 hours) at 04/09/14 0941 Last data filed at 04/09/14 0700  Gross per 24 hour  Intake 3811.5 ml  Output      0 ml  Net 3811.5 ml    Last BM: 12/31  Labs:   Recent Labs Lab 04/08/14 0355 04/08/14 1145 04/08/14 1555 04/08/14 2106 04/09/14 0540  NA 140 142  --   --  142  K <2.0* <2.0*  --  2.7* 3.4*  CL 95* 96  --   --  102  CO2 36* 36*  --   --  33*  BUN 9 8  --   --  <5*  CREATININE 0.68 0.65  --   --  0.66  CALCIUM 9.4 9.6  --   --  9.9  MG  --   --  1.5  --  1.8  GLUCOSE 125* 147*  --   --  120*    CBG (last 3)  No results for input(s): GLUCAP in the last 72 hours.  Scheduled Meds: . amLODipine  10 mg Oral Daily  . atorvastatin  40 mg Oral q1800  . enoxaparin (LOVENOX) injection  40 mg Subcutaneous Q24H  . feeding supplement (RESOURCE BREEZE)  1 Container Oral TID BM  . folic acid  1 mg  Intravenous Daily  . lisinopril  40 mg Oral Daily  . magnesium gluconate  500 mg Oral BID  . pantoprazole (PROTONIX) IV  40 mg Intravenous Q24H  . potassium chloride  40 mEq Oral BID  . thiamine  100 mg Intravenous Daily    Continuous Infusions: . dextrose 5 % and 0.9 % NaCl with KCl 40 mEq/L 75 mL/hr at 04/09/14 0011    Past Medical History  Diagnosis Date  . Stroke   . Hypercholesteremia   . Hypertension   . Hypokalemia   . History of noncompliance with medical treatment   . High aldosterone   . Anger   . Mammogram abnormal   . Esophagus cancer 04/07/2014  . Mechanical dysphagia 04/07/2014    History reviewed. No pertinent past surgical history.  Clayton Bibles, MS, RD, LDN Pager: [phone] After Hours Pager: [phone]

## 2014-04-09 NOTE — Progress Notes (Signed)
Sheila Young   DOB:July 09, 1950   HG#:[id number]    Subjective: She feels well. She is able to tolerate most oral intake. She is hungry. Denies nausea or vomiting. Denies diarrhea or constipation. No fevers or chills. She denies pain.  Objective:  Filed Vitals:   04/09/14 0509  BP: 149/61  Pulse: 57  Temp: 99 F (37.2 C)  Resp: 16     Intake/Output Summary (Last 24 hours) at 04/09/14 1333 Last data filed at 04/09/14 0700  Gross per 24 hour  Intake 1641.25 ml  Output      0 ml  Net 1641.25 ml    GENERAL:alert, no distress and comfortable. She looks thin SKIN: skin color, texture, turgor are normal, no rashes or significant lesions EYES: normal, Conjunctiva are pink and non-injected, sclera clear OROPHARYNX:no exudate, no erythema and lips, buccal mucosa, and tongue normal  NECK: supple, thyroid normal size, non-tender, without nodularity LYMPH:  no palpable lymphadenopathy in the cervical, axillary or inguinal LUNGS: clear to auscultation and percussion with normal breathing effort HEART: regular rate & rhythm and no murmurs and no lower extremity edema ABDOMEN:abdomen soft, non-tender and normal bowel sounds Musculoskeletal:no cyanosis of digits and no clubbing  NEURO: alert & oriented x 3 with fluent speech, no focal motor/sensory deficits   Labs:  Lab Results  Component Value Date   WBC 18.2* 04/09/2014   HGB 10.4* 04/09/2014   HCT 33.0* 04/09/2014   MCV 86.6 04/09/2014   PLT 428* 04/09/2014   NEUTROABS 16.4* 04/07/2014    Lab Results  Component Value Date   NA 142 04/09/2014   K 3.4* 04/09/2014   CL 102 04/09/2014   CO2 33* 04/09/2014    Studies:  Ct Chest W Contrast  04/08/2014   CLINICAL DATA:  Esophageal cancer on endoscopy yesterday. Dysphagia. Evaluate for metastasis.  EXAM: CT CHEST, ABDOMEN, AND PELVIS WITH CONTRAST  TECHNIQUE: Multidetector CT imaging of the chest, abdomen and pelvis was performed following the standard protocol during bolus  administration of intravenous contrast.  CONTRAST:  13mL OMNIPAQUE IOHEXOL 300 MG/ML  SOLN  COMPARISON:  Esophagram of 03/28/2014.  FINDINGS: CT CHEST FINDINGS  Lungs/Pleura: Moderate centrilobular emphysema. Mild biapical pleural parenchymal scarring.  Dependent atelectasis or scarring at the lung bases, greater on the left. Mild interstitial thickening of the left apex, favored to represent scarring.  No pleural fluid.  Heart/Mediastinum: No supraclavicular adenopathy. Aortic and branch vessel atherosclerosis. Mild cardiomegaly. Multivessel coronary artery atherosclerosis. No central pulmonary embolism, on this non-dedicated study. No hilar adenopathy.  Lungs segment distal esophageal carcinoma with marked soft tissue thickening, including on image 44 of series 2. This is partially obstructive, as evidenced by dilatation more proximally with fluid level within.  Periesophageal adenopathy. Example 1.0 cm left-sided node on image 33 of series 2. There is likely adenopathy along the periaortic space on image 26 of series 2. 1.0 cm. Retroesophageal node measures 1.4 cm on image 27.  CT ABDOMEN AND PELVIS FINDINGS  Hepatobiliary: Too small to characterize lesion within medial segment left lobe that 7 mm on image 67. Normal gallbladder, without biliary ductal dilatation.  Pancreas: Normal, without mass or pancreatic ductal dilatation.  Spleen: Normal  Adrenals/Urinary tract: Mild right adrenal thickening. Left adrenal nodule is mildly irregular and measures 2.7 x 2.3 cm on image 63. Mildly malrotated left kidney. Punctate renal collecting system stones bilaterally. No hydronephrosis. Normal ureters and urinary bladder.  Stomach/Bowel: Normal stomach, without wall thickening. Pelvic floor laxity. Apparent soft tissue fullness in the anorectal  region is favored to be secondary. Normal terminal ileum and appendix. Normal small bowel.  Vascular/Lymphatic: Aortic and branch vessel atherosclerosis. No abdominopelvic  adenopathy. Significant atherosclerosis involving the superficial femoral arteries bilaterally. Apparent occlusion on the right, including on image 120 of series 2. Significant narrowing on the left on image 119 of series 2.  Reproductive: Retroverted uterus. Small calcifications within are likely dystrophic. Suspect small uterine fibroids, including posteriorly on image 106. No adnexal mass.  Other: No significant free fluid. No evidence of omental or peritoneal disease.  Musculoskeletal: Tiny sclerotic lesions within the pelvis are likely bone islands.  IMPRESSION: CT CHEST IMPRESSION  1. Large, partially obstructive distal esophageal primary with surrounding nodal metastasis. 2. Age advanced coronary artery atherosclerosis. Recommend assessment of coronary risk factors and consideration of medical therapy.  CT ABDOMEN AND PELVIS IMPRESSION  1. Indeterminate left hepatic lobe lesion. Slightly favored to represent a cyst, given well-circumscribed appearance on coronal reformatted images. 2. Left adrenal mass. Technically indeterminate. Given size, metastatic disease is a concern. Consider further evaluation with PET. This would also better evaluate the liver lesion. 3. Advanced atherosclerosis with apparent exclusion of the right superficial femoral artery and significant stenosis of the left superficial femoral artery. 4. Uterine fibroids. 5. Bilateral nephrolithiasis.   Electronically Signed   By: Abigail Miyamoto M.D.   On: 04/08/2014 14:50   Ct Abdomen Pelvis W Contrast  04/08/2014   CLINICAL DATA:  Esophageal cancer on endoscopy yesterday. Dysphagia. Evaluate for metastasis.  EXAM: CT CHEST, ABDOMEN, AND PELVIS WITH CONTRAST  TECHNIQUE: Multidetector CT imaging of the chest, abdomen and pelvis was performed following the standard protocol during bolus administration of intravenous contrast.  CONTRAST:  131mL OMNIPAQUE IOHEXOL 300 MG/ML  SOLN  COMPARISON:  Esophagram of 03/28/2014.  FINDINGS: CT CHEST FINDINGS   Lungs/Pleura: Moderate centrilobular emphysema. Mild biapical pleural parenchymal scarring.  Dependent atelectasis or scarring at the lung bases, greater on the left. Mild interstitial thickening of the left apex, favored to represent scarring.  No pleural fluid.  Heart/Mediastinum: No supraclavicular adenopathy. Aortic and branch vessel atherosclerosis. Mild cardiomegaly. Multivessel coronary artery atherosclerosis. No central pulmonary embolism, on this non-dedicated study. No hilar adenopathy.  Lungs segment distal esophageal carcinoma with marked soft tissue thickening, including on image 44 of series 2. This is partially obstructive, as evidenced by dilatation more proximally with fluid level within.  Periesophageal adenopathy. Example 1.0 cm left-sided node on image 33 of series 2. There is likely adenopathy along the periaortic space on image 26 of series 2. 1.0 cm. Retroesophageal node measures 1.4 cm on image 27.  CT ABDOMEN AND PELVIS FINDINGS  Hepatobiliary: Too small to characterize lesion within medial segment left lobe that 7 mm on image 67. Normal gallbladder, without biliary ductal dilatation.  Pancreas: Normal, without mass or pancreatic ductal dilatation.  Spleen: Normal  Adrenals/Urinary tract: Mild right adrenal thickening. Left adrenal nodule is mildly irregular and measures 2.7 x 2.3 cm on image 63. Mildly malrotated left kidney. Punctate renal collecting system stones bilaterally. No hydronephrosis. Normal ureters and urinary bladder.  Stomach/Bowel: Normal stomach, without wall thickening. Pelvic floor laxity. Apparent soft tissue fullness in the anorectal region is favored to be secondary. Normal terminal ileum and appendix. Normal small bowel.  Vascular/Lymphatic: Aortic and branch vessel atherosclerosis. No abdominopelvic adenopathy. Significant atherosclerosis involving the superficial femoral arteries bilaterally. Apparent occlusion on the right, including on image 120 of series 2.  Significant narrowing on the left on image 119 of series 2.  Reproductive:  Retroverted uterus. Small calcifications within are likely dystrophic. Suspect small uterine fibroids, including posteriorly on image 106. No adnexal mass.  Other: No significant free fluid. No evidence of omental or peritoneal disease.  Musculoskeletal: Tiny sclerotic lesions within the pelvis are likely bone islands.  IMPRESSION: CT CHEST IMPRESSION  1. Large, partially obstructive distal esophageal primary with surrounding nodal metastasis. 2. Age advanced coronary artery atherosclerosis. Recommend assessment of coronary risk factors and consideration of medical therapy.  CT ABDOMEN AND PELVIS IMPRESSION  1. Indeterminate left hepatic lobe lesion. Slightly favored to represent a cyst, given well-circumscribed appearance on coronal reformatted images. 2. Left adrenal mass. Technically indeterminate. Given size, metastatic disease is a concern. Consider further evaluation with PET. This would also better evaluate the liver lesion. 3. Advanced atherosclerosis with apparent exclusion of the right superficial femoral artery and significant stenosis of the left superficial femoral artery. 4. Uterine fibroids. 5. Bilateral nephrolithiasis.   Electronically Signed   By: Abigail Miyamoto M.D.   On: 04/08/2014 14:50    Assessment & Plan:  #1 locally advanced esophageal cancer Plan is for placement of port and feeding tube next week before the start of therapy. In the meantime, she will remain in the hospital for aggressive supportive care.  #2 dysphagia with protein calorie malnutrition This is due to esophageal obstruction. She is followed closely with dietitian recommendations. She is able to swallow liquid nutritional supplement. Continue to same. She will need feeding tube in the near future for enteral feeding.  #3 hypokalemia This is due to poor oral intake. I will continue oral and IV potassium replacement therapy.  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 We'll transfuse if hemoglobin dropped to less than 8 g or if the patient is symptomatic  #5 leukocytosis Secondary to pain, inflammation, and history of tobacco abuse.  The patient is afebrile We will provide supportive care at this time with IV fluids, pain meds  #6 reactive thrombocytosis Likely reactive, in the setting of anemia and malignancy history of CVA, in the form of cerebral artery occlusion. Will hydrate, and monitor closely The patient was on aspirin, discontinued since admission The patient will be placed on DVT prophylaxis with Lovenox upon admission  #7 history of alcohol abuse The patient has not had consumption for about 1 week due to these symptoms Monitor for DT She will receive Thiamine and Folic Acid.   #8 DVT prophylaxis She is on Lovenox  #9 full code   [facility], [place], MD 04/09/2014  1:33 PM

## 2014-04-09 NOTE — Progress Notes (Signed)
PHARMACIST - PHYSICIAN COMMUNICATION  Description contains the criteria that are approved Note: Policy Excludes:  Esophagectomy patients  DR:   Marin Olp CONCERNING: IV to Oral Route Change Policy  RECOMMENDATION: This patient is receiving folate, thiamine, pantoprazole by the intravenous route.  Based on criteria approved by the Pharmacy and Therapeutics Committee, the intravenous medication(s) is/are being converted to the equivalent oral dose form(s).   DESCRIPTION: These criteria include:  The patient is eating (either orally or via tube) and/or has been taking other orally administered medications for a least 24 hours  The patient has no evidence of active gastrointestinal bleeding or impaired GI absorption (gastrectomy, short bowel, patient on TNA or NPO).  If you have questions about this conversion, please contact the Pharmacy Department  []   [phone] )  [facility] []   [phone] )  [facility]  []   [phone] )  [facility] [x]   [phone] )  [place], [place], [facility] 04/09/2014 12:59 PM

## 2014-04-10 LAB — POTASSIUM: Potassium: 3.2 mmol/L — ABNORMAL LOW (ref 3.5–5.1)

## 2014-04-10 MED ORDER — POTASSIUM CHLORIDE 10 MEQ/100ML IV SOLN
10.0000 meq | INTRAVENOUS | Status: AC
  Administered 2014-04-10 (×2): 10 meq via INTRAVENOUS
  Filled 2014-04-10 (×2): qty 100

## 2014-04-10 NOTE — Progress Notes (Signed)
IR team aware of request for both Port and G-tube. Chart reviewed of this 64 yo female with esophageal cancer. CT abd shows acceptable window for percutaneous access. Swallow study-not obstructed but tight distal esophagus secondary to mass. Currently tolerating some liquids, but anticipated radiation therapy. Pt on Plavix as an outpt, last dose taken was 12/30 Plavix has not been given since admission will need to be held 5 full days prior to procedure. IR team will follow along and tentatively coordinate placement of port/g-tube on 1/5.  Ascencion Dike PA-C Interventional Radiology 04/10/2014 8:19 AM

## 2014-04-10 NOTE — Progress Notes (Signed)
Sheila Young   DOB:25-Sep-1950   BJ#:[id number]    Subjective: She feels well. She is able to tolerate most oral intake. She is hungry. Denies nausea or vomiting. Denies diarrhea or constipation. No fevers or chills. She denies pain.  Objective:  Filed Vitals:   04/10/14 1345  BP: 135/68  Pulse: 66  Temp: 98.6 F (37 C)  Resp: 18     Intake/Output Summary (Last 24 hours) at 04/10/14 1415 Last data filed at 04/10/14 1300  Gross per 24 hour  Intake   2345 ml  Output      0 ml  Net   2345 ml    GENERAL:alert, no distress and comfortable SKIN: skin color, texture, turgor are normal, no rashes or significant lesions EYES: normal, Conjunctiva are pink and non-injected, sclera clear OROPHARYNX:no exudate, no erythema and lips, buccal mucosa, and tongue normal  NECK: supple, thyroid normal size, non-tender, without nodularity LYMPH:  no palpable lymphadenopathy in the cervical, axillary or inguinal LUNGS: clear to auscultation and percussion with normal breathing effort HEART: regular rate & rhythm and no murmurs and no lower extremity edema ABDOMEN:abdomen soft, non-tender and normal bowel sounds Musculoskeletal:no cyanosis of digits and no clubbing  NEURO: alert & oriented x 3 with fluent speech, no focal motor/sensory deficits   Labs:  Lab Results  Component Value Date   WBC 18.2* 04/09/2014   HGB 10.4* 04/09/2014   HCT 33.0* 04/09/2014   MCV 86.6 04/09/2014   PLT 428* 04/09/2014   NEUTROABS 16.4* 04/07/2014    Lab Results  Component Value Date   NA 142 04/09/2014   K 3.2* 04/10/2014   CL 102 04/09/2014   CO2 33* 04/09/2014    Studies:  Ct Chest W Contrast  04/08/2014   CLINICAL DATA:  Esophageal cancer on endoscopy yesterday. Dysphagia. Evaluate for metastasis.  EXAM: CT CHEST, ABDOMEN, AND PELVIS WITH CONTRAST  TECHNIQUE: Multidetector CT imaging of the chest, abdomen and pelvis was performed following the standard protocol during bolus administration of intravenous  contrast.  CONTRAST:  168mL OMNIPAQUE IOHEXOL 300 MG/ML  SOLN  COMPARISON:  Esophagram of 03/28/2014.  FINDINGS: CT CHEST FINDINGS  Lungs/Pleura: Moderate centrilobular emphysema. Mild biapical pleural parenchymal scarring.  Dependent atelectasis or scarring at the lung bases, greater on the left. Mild interstitial thickening of the left apex, favored to represent scarring.  No pleural fluid.  Heart/Mediastinum: No supraclavicular adenopathy. Aortic and branch vessel atherosclerosis. Mild cardiomegaly. Multivessel coronary artery atherosclerosis. No central pulmonary embolism, on this non-dedicated study. No hilar adenopathy.  Lungs segment distal esophageal carcinoma with marked soft tissue thickening, including on image 44 of series 2. This is partially obstructive, as evidenced by dilatation more proximally with fluid level within.  Periesophageal adenopathy. Example 1.0 cm left-sided node on image 33 of series 2. There is likely adenopathy along the periaortic space on image 26 of series 2. 1.0 cm. Retroesophageal node measures 1.4 cm on image 27.  CT ABDOMEN AND PELVIS FINDINGS  Hepatobiliary: Too small to characterize lesion within medial segment left lobe that 7 mm on image 67. Normal gallbladder, without biliary ductal dilatation.  Pancreas: Normal, without mass or pancreatic ductal dilatation.  Spleen: Normal  Adrenals/Urinary tract: Mild right adrenal thickening. Left adrenal nodule is mildly irregular and measures 2.7 x 2.3 cm on image 63. Mildly malrotated left kidney. Punctate renal collecting system stones bilaterally. No hydronephrosis. Normal ureters and urinary bladder.  Stomach/Bowel: Normal stomach, without wall thickening. Pelvic floor laxity. Apparent soft tissue fullness in the  anorectal region is favored to be secondary. Normal terminal ileum and appendix. Normal small bowel.  Vascular/Lymphatic: Aortic and branch vessel atherosclerosis. No abdominopelvic adenopathy. Significant  atherosclerosis involving the superficial femoral arteries bilaterally. Apparent occlusion on the right, including on image 120 of series 2. Significant narrowing on the left on image 119 of series 2.  Reproductive: Retroverted uterus. Small calcifications within are likely dystrophic. Suspect small uterine fibroids, including posteriorly on image 106. No adnexal mass.  Other: No significant free fluid. No evidence of omental or peritoneal disease.  Musculoskeletal: Tiny sclerotic lesions within the pelvis are likely bone islands.  IMPRESSION: CT CHEST IMPRESSION  1. Large, partially obstructive distal esophageal primary with surrounding nodal metastasis. 2. Age advanced coronary artery atherosclerosis. Recommend assessment of coronary risk factors and consideration of medical therapy.  CT ABDOMEN AND PELVIS IMPRESSION  1. Indeterminate left hepatic lobe lesion. Slightly favored to represent a cyst, given well-circumscribed appearance on coronal reformatted images. 2. Left adrenal mass. Technically indeterminate. Given size, metastatic disease is a concern. Consider further evaluation with PET. This would also better evaluate the liver lesion. 3. Advanced atherosclerosis with apparent exclusion of the right superficial femoral artery and significant stenosis of the left superficial femoral artery. 4. Uterine fibroids. 5. Bilateral nephrolithiasis.   Electronically Signed   By: Abigail Miyamoto M.D.   On: 04/08/2014 14:50   Ct Abdomen Pelvis W Contrast  04/08/2014   CLINICAL DATA:  Esophageal cancer on endoscopy yesterday. Dysphagia. Evaluate for metastasis.  EXAM: CT CHEST, ABDOMEN, AND PELVIS WITH CONTRAST  TECHNIQUE: Multidetector CT imaging of the chest, abdomen and pelvis was performed following the standard protocol during bolus administration of intravenous contrast.  CONTRAST:  161mL OMNIPAQUE IOHEXOL 300 MG/ML  SOLN  COMPARISON:  Esophagram of 03/28/2014.  FINDINGS: CT CHEST FINDINGS  Lungs/Pleura: Moderate  centrilobular emphysema. Mild biapical pleural parenchymal scarring.  Dependent atelectasis or scarring at the lung bases, greater on the left. Mild interstitial thickening of the left apex, favored to represent scarring.  No pleural fluid.  Heart/Mediastinum: No supraclavicular adenopathy. Aortic and branch vessel atherosclerosis. Mild cardiomegaly. Multivessel coronary artery atherosclerosis. No central pulmonary embolism, on this non-dedicated study. No hilar adenopathy.  Lungs segment distal esophageal carcinoma with marked soft tissue thickening, including on image 44 of series 2. This is partially obstructive, as evidenced by dilatation more proximally with fluid level within.  Periesophageal adenopathy. Example 1.0 cm left-sided node on image 33 of series 2. There is likely adenopathy along the periaortic space on image 26 of series 2. 1.0 cm. Retroesophageal node measures 1.4 cm on image 27.  CT ABDOMEN AND PELVIS FINDINGS  Hepatobiliary: Too small to characterize lesion within medial segment left lobe that 7 mm on image 67. Normal gallbladder, without biliary ductal dilatation.  Pancreas: Normal, without mass or pancreatic ductal dilatation.  Spleen: Normal  Adrenals/Urinary tract: Mild right adrenal thickening. Left adrenal nodule is mildly irregular and measures 2.7 x 2.3 cm on image 63. Mildly malrotated left kidney. Punctate renal collecting system stones bilaterally. No hydronephrosis. Normal ureters and urinary bladder.  Stomach/Bowel: Normal stomach, without wall thickening. Pelvic floor laxity. Apparent soft tissue fullness in the anorectal region is favored to be secondary. Normal terminal ileum and appendix. Normal small bowel.  Vascular/Lymphatic: Aortic and branch vessel atherosclerosis. No abdominopelvic adenopathy. Significant atherosclerosis involving the superficial femoral arteries bilaterally. Apparent occlusion on the right, including on image 120 of series 2. Significant narrowing on the  left on image 119 of series 2.  Reproductive: Retroverted uterus. Small calcifications within are likely dystrophic. Suspect small uterine fibroids, including posteriorly on image 106. No adnexal mass.  Other: No significant free fluid. No evidence of omental or peritoneal disease.  Musculoskeletal: Tiny sclerotic lesions within the pelvis are likely bone islands.  IMPRESSION: CT CHEST IMPRESSION  1. Large, partially obstructive distal esophageal primary with surrounding nodal metastasis. 2. Age advanced coronary artery atherosclerosis. Recommend assessment of coronary risk factors and consideration of medical therapy.  CT ABDOMEN AND PELVIS IMPRESSION  1. Indeterminate left hepatic lobe lesion. Slightly favored to represent a cyst, given well-circumscribed appearance on coronal reformatted images. 2. Left adrenal mass. Technically indeterminate. Given size, metastatic disease is a concern. Consider further evaluation with PET. This would also better evaluate the liver lesion. 3. Advanced atherosclerosis with apparent exclusion of the right superficial femoral artery and significant stenosis of the left superficial femoral artery. 4. Uterine fibroids. 5. Bilateral nephrolithiasis.   Electronically Signed   By: Abigail Miyamoto M.D.   On: 04/08/2014 14:50    Assessment & Plan:  #1 locally advanced esophageal cancer Plan is for placement of port and feeding tube next week before the start of therapy. In the meantime, she will remain in the hospital for aggressive supportive care.  #2 dysphagia with protein calorie malnutrition This is due to esophageal obstruction. She is followed closely with dietitian recommendations. She is able to swallow liquid nutritional supplement. Continue to same. She will need feeding tube in the near future for enteral feeding.  #3 hypokalemia This is due to poor oral intake. I will continue oral and IV potassium replacement therapy.  #4 full code  [facility], [place], MD 04/10/2014   2:15 PM

## 2014-04-11 ENCOUNTER — Inpatient Hospital Stay (HOSPITAL_COMMUNITY): Payer: Medicare Other

## 2014-04-11 DIAGNOSIS — R1310 Dysphagia, unspecified: Secondary | ICD-10-CM | POA: Insufficient documentation

## 2014-04-11 LAB — COMPREHENSIVE METABOLIC PANEL
ALT: 14 U/L (ref 0–35)
AST: 17 U/L (ref 0–37)
Albumin: 3.1 g/dL — ABNORMAL LOW (ref 3.5–5.2)
Alkaline Phosphatase: 71 U/L (ref 39–117)
Anion gap: 9 (ref 5–15)
BUN: <5 mg/dL — ABNORMAL LOW (ref 6–23)
CO2: 25 mmol/L (ref 19–32)
Calcium: 10.3 mg/dL (ref 8.4–10.5)
Chloride: 105 mEq/L (ref 96–112)
Creatinine, Ser: 0.61 mg/dL (ref 0.50–1.10)
GFR calc non Af Amer: >90 mL/min (ref >90)
GLUCOSE: 119 mg/dL — AB (ref 70–99)
POTASSIUM: 3.9 mmol/L (ref 3.5–5.1)
SODIUM: 139 mmol/L (ref 135–145)
TOTAL PROTEIN: 7.1 g/dL (ref 6.0–8.3)
Total Bilirubin: 0.7 mg/dL (ref 0.3–1.2)

## 2014-04-11 LAB — CBC
HCT: 33.1 % — ABNORMAL LOW (ref 36.0–46.0)
Hemoglobin: 10.4 g/dL — ABNORMAL LOW (ref 12.0–15.0)
MCH: 27.2 pg (ref 26.0–34.0)
MCHC: 31.4 g/dL (ref 30.0–36.0)
MCV: 86.4 fL (ref 78.0–100.0)
Platelets: 392 10*3/uL (ref 150–400)
RBC: 3.83 MIL/uL — ABNORMAL LOW (ref 3.87–5.11)
RDW: 15.3 % (ref 11.5–15.5)
WBC: 18.6 10*3/uL — ABNORMAL HIGH (ref 4.0–10.5)

## 2014-04-11 LAB — POTASSIUM: POTASSIUM: 3.8 mmol/L (ref 3.5–5.1)

## 2014-04-11 LAB — MAGNESIUM: Magnesium: 1.4 mg/dL — ABNORMAL LOW (ref 1.5–2.5)

## 2014-04-11 MED ORDER — CEFAZOLIN SODIUM-DEXTROSE 2-3 GM-% IV SOLR
2.0000 g | INTRAVENOUS | Status: AC
  Administered 2014-04-12: 2 g via INTRAVENOUS
  Filled 2014-04-11: qty 50

## 2014-04-11 MED ORDER — GADOBENATE DIMEGLUMINE 529 MG/ML IV SOLN
15.0000 mL | Freq: Once | INTRAVENOUS | Status: AC | PRN
  Administered 2014-04-11: 12 mL via INTRAVENOUS

## 2014-04-11 NOTE — Progress Notes (Signed)
Patient ID: Sheila Young, female   DOB: May 05, 1950, 64 y.o.   MRN: [id number] Spoke briefly with pt regarding port a cath /G tube placements that were requested by oncology. Pt states she was not aware of need for these procedures and "does not want any tubes put in me". Nurse notified and will contact oncologist. Please call our service at [phone] if plans change.

## 2014-04-11 NOTE — Progress Notes (Signed)
Pt unable to tolerate dysphagia 2 diet. Pt observed spitting food out into bedside commode. Pt asked if she was able to swallow pills whole pt stated she could swallow pills okay. Observed pt having difficulty swallowing pills. Pt was able to take small sips of resource breeze with some difficulty drinking. Pt acted as though choking. Reported to Nancy Marus, RN.

## 2014-04-11 NOTE — Progress Notes (Signed)
Patient stated to IR MD: this is my body and I don't want any tubes in it. Dr Marin Olp notified about what the patient said, orders discontinued per MD.

## 2014-04-11 NOTE — Consult Note (Signed)
Reason for consult: port a cath /percutaneous gastrostomy tube placements  Referring Physician(s): Dr. Marin Olp  History of Present Illness: Sheila Young is a 64 y.o. 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 She presented with progressive solid food dysphagia, weight loss, and intermittent nausea. She also has hx of CVA (last dose plavix 12/30), HTN, prior tobacco/alcohol abuse. The distal esophagus is partially obstructed secondary to cancer with surrounding nodal metastasis. Request now received from oncology for both port a cath and percutaneous gastrostomy tube placements. Pt was initially refusing above procedures; however since family has arrived and further discussions held she has agreed to proceed.   Past Medical History  Diagnosis Date  . Stroke   . Hypercholesteremia   . Hypertension   . Hypokalemia   . History of noncompliance with medical treatment   . High aldosterone   . Anger   . Mammogram abnormal   . Esophagus cancer 04/07/2014  . Mechanical dysphagia 04/07/2014    History reviewed. No pertinent past surgical history.  Allergies: Review of patient's allergies indicates no known allergies.  Medications: Prior to Admission medications   Medication Sig Start Date End Date Taking? Authorizing Provider  amLODipine (NORVASC) 10 MG tablet Take 10 mg by mouth daily.   Yes Historical Provider, MD  aspirin 325 MG tablet Take 325 mg by mouth daily.   Yes Historical Provider, MD  atorvastatin (LIPITOR) 40 MG tablet Take 40 mg by mouth daily.   Yes Historical Provider, MD  clopidogrel (PLAVIX) 75 MG tablet Take 75 mg by mouth daily.   Yes Historical Provider, MD  lisinopril (PRINIVIL,ZESTRIL) 40 MG tablet Take 40 mg by mouth daily.   Yes Historical Provider, MD  magnesium gluconate (MAGONATE) 500 MG tablet Take 500 mg by mouth 2 (two) times daily.   Yes Historical Provider, MD  potassium chloride 40 MEQ/15ML (20%) LIQD Take 20  mEq by mouth 2 (two) times daily.    Yes Historical Provider, MD    History reviewed. No pertinent family history.  History   Social History  . Marital Status: Single    Spouse Name: N/A    Number of Children: N/A  . Years of Education: N/A   Social History Main Topics  . Smoking status: Current Every Day Smoker -- 0.50 packs/day for 45 years    Types: Cigarettes    Start date: 06/05/1968  . Smokeless tobacco: Never Used     Comment: 03-2014 still smoking  . Alcohol Use: No  . Drug Use: None  . Sexual Activity: Not Currently   Other Topics Concern  . None   Social History Narrative      Review of Systems  see above  Vital Signs: BP 138/66 mmHg  Pulse 62  Temp(Src) 98.4 F (36.9 C) (Oral)  Resp 16  Ht 5\' 6"  (1.676 m)  Wt 139 lb 3.2 oz (63.141 kg)  BMI 22.48 kg/m2  SpO2 99%  Physical Exam pt awake/answers questions; memory somewhat compromised; chest- CTA bilat; heart- RRR; abd- soft,+NS,NT; ext- FROM, no edema.  Imaging: Ct Chest W Contrast  04/08/2014   CLINICAL DATA:  Esophageal cancer on endoscopy yesterday. Dysphagia. Evaluate for metastasis.  EXAM: CT CHEST, ABDOMEN, AND PELVIS WITH CONTRAST  TECHNIQUE: Multidetector CT imaging of the chest, abdomen and pelvis was performed following the standard protocol during bolus administration of intravenous contrast.  CONTRAST:  191mL OMNIPAQUE IOHEXOL 300 MG/ML  SOLN  COMPARISON:  Esophagram of 03/28/2014.  FINDINGS: CT  CHEST FINDINGS  Lungs/Pleura: Moderate centrilobular emphysema. Mild biapical pleural parenchymal scarring.  Dependent atelectasis or scarring at the lung bases, greater on the left. Mild interstitial thickening of the left apex, favored to represent scarring.  No pleural fluid.  Heart/Mediastinum: No supraclavicular adenopathy. Aortic and branch vessel atherosclerosis. Mild cardiomegaly. Multivessel coronary artery atherosclerosis. No central pulmonary embolism, on this non-dedicated study. No hilar  adenopathy.  Lungs segment distal esophageal carcinoma with marked soft tissue thickening, including on image 44 of series 2. This is partially obstructive, as evidenced by dilatation more proximally with fluid level within.  Periesophageal adenopathy. Example 1.0 cm left-sided node on image 33 of series 2. There is likely adenopathy along the periaortic space on image 26 of series 2. 1.0 cm. Retroesophageal node measures 1.4 cm on image 27.  CT ABDOMEN AND PELVIS FINDINGS  Hepatobiliary: Too small to characterize lesion within medial segment left lobe that 7 mm on image 67. Normal gallbladder, without biliary ductal dilatation.  Pancreas: Normal, without mass or pancreatic ductal dilatation.  Spleen: Normal  Adrenals/Urinary tract: Mild right adrenal thickening. Left adrenal nodule is mildly irregular and measures 2.7 x 2.3 cm on image 63. Mildly malrotated left kidney. Punctate renal collecting system stones bilaterally. No hydronephrosis. Normal ureters and urinary bladder.  Stomach/Bowel: Normal stomach, without wall thickening. Pelvic floor laxity. Apparent soft tissue fullness in the anorectal region is favored to be secondary. Normal terminal ileum and appendix. Normal small bowel.  Vascular/Lymphatic: Aortic and branch vessel atherosclerosis. No abdominopelvic adenopathy. Significant atherosclerosis involving the superficial femoral arteries bilaterally. Apparent occlusion on the right, including on image 120 of series 2. Significant narrowing on the left on image 119 of series 2.  Reproductive: Retroverted uterus. Small calcifications within are likely dystrophic. Suspect small uterine fibroids, including posteriorly on image 106. No adnexal mass.  Other: No significant free fluid. No evidence of omental or peritoneal disease.  Musculoskeletal: Tiny sclerotic lesions within the pelvis are likely bone islands.  IMPRESSION: CT CHEST IMPRESSION  1. Large, partially obstructive distal esophageal primary with  surrounding nodal metastasis. 2. Age advanced coronary artery atherosclerosis. Recommend assessment of coronary risk factors and consideration of medical therapy.  CT ABDOMEN AND PELVIS IMPRESSION  1. Indeterminate left hepatic lobe lesion. Slightly favored to represent a cyst, given well-circumscribed appearance on coronal reformatted images. 2. Left adrenal mass. Technically indeterminate. Given size, metastatic disease is a concern. Consider further evaluation with PET. This would also better evaluate the liver lesion. 3. Advanced atherosclerosis with apparent exclusion of the right superficial femoral artery and significant stenosis of the left superficial femoral artery. 4. Uterine fibroids. 5. Bilateral nephrolithiasis.   Electronically Signed   By: Abigail Miyamoto M.D.   On: 04/08/2014 14:50   Mr Abdomen W Wo Contrast  04/11/2014   CLINICAL DATA:  Esophageal cancer.  Hepatic and adrenal lesions.  EXAM: MRI ABDOMEN WITHOUT AND WITH CONTRAST  TECHNIQUE: Multiplanar multisequence MR imaging of the abdomen was performed both before and after the administration of intravenous contrast.  CONTRAST:  37mL MULTIHANCE GADOBENATE DIMEGLUMINE 529 MG/ML IV SOLN  COMPARISON:  04/08/2014  FINDINGS: Despite efforts by the technologist and patient, motion artifact is present on today's exam and could not be eliminated. This reduces exam sensitivity and specificity.  Lower chest: Large circumferential mass of the distal esophagus, 5.7 by 3.5 cm on image 3 6 series 3. The mass indents the posterior margin of the left atrium. Restricted diffusion observed in the mass. Mild scarring or atelectasis in  the left lower lobe.  Hepatobiliary: The tiny lesion in segment 4 has fluid signal intensity characteristics favoring small cyst. There is diffuse abnormal low signal throughout the hepatic parenchyma on essentially all sequences.  Pancreas: Unremarkable  Spleen: Diffuse abnormal low signal on all sequences throughout the parenchyma.   Adrenals/Urinary Tract: 2.7 by 3.0 cm left adrenal mass has extremely low signal/signal void on all sequences including pre and postcontrast sequences.  Stomach/Bowel: Unremarkable except in the distal esophagus as noted above.  Vascular/Lymphatic: High intravascular signal on precontrast images. I spoke with the technologist who confirmed to that the precontrast images did not include accidental early contrast injection.  Musculoskeletal: Low marrow signal on gradient images.  IMPRESSION: 1. Today' s exam demonstrates a very unusual constellation of imaging findings including very low hepatic and splenic parenchymal signal, extremely low signal of the left adrenal mass on all sequences, and low gradient signal in the marrow. Also, the precontrast sequences demonstrate high-signal in all vascular structures, very similar in appearance to as if contrast had already been administered (I confirmed with the technologist thick contrast was not administered early). This atypical and confounding appearance might be due to the paramagnetic effects of recent magnesium supplementation, combined with possible hemochromatosis. 2. Large enhancing distal esophageal tumor. The left adrenal gland does not demonstrate appreciable enhancement, although given the diffuse signal void in this vicinity, I am reluctant to rule out a confounding effect which might obscure tumor enhancement. Nuclear medicine PET-CT should be strongly considered. 3. The small segment IV lesion in the liver has a fairly convincing appearance for cyst, and is not observed to enhance.   Electronically Signed   By: Sherryl Barters M.D.   On: 04/11/2014 10:20   Ct Abdomen Pelvis W Contrast  04/08/2014   CLINICAL DATA:  Esophageal cancer on endoscopy yesterday. Dysphagia. Evaluate for metastasis.  EXAM: CT CHEST, ABDOMEN, AND PELVIS WITH CONTRAST  TECHNIQUE: Multidetector CT imaging of the chest, abdomen and pelvis was performed following the standard protocol  during bolus administration of intravenous contrast.  CONTRAST:  193mL OMNIPAQUE IOHEXOL 300 MG/ML  SOLN  COMPARISON:  Esophagram of 03/28/2014.  FINDINGS: CT CHEST FINDINGS  Lungs/Pleura: Moderate centrilobular emphysema. Mild biapical pleural parenchymal scarring.  Dependent atelectasis or scarring at the lung bases, greater on the left. Mild interstitial thickening of the left apex, favored to represent scarring.  No pleural fluid.  Heart/Mediastinum: No supraclavicular adenopathy. Aortic and branch vessel atherosclerosis. Mild cardiomegaly. Multivessel coronary artery atherosclerosis. No central pulmonary embolism, on this non-dedicated study. No hilar adenopathy.  Lungs segment distal esophageal carcinoma with marked soft tissue thickening, including on image 44 of series 2. This is partially obstructive, as evidenced by dilatation more proximally with fluid level within.  Periesophageal adenopathy. Example 1.0 cm left-sided node on image 33 of series 2. There is likely adenopathy along the periaortic space on image 26 of series 2. 1.0 cm. Retroesophageal node measures 1.4 cm on image 27.  CT ABDOMEN AND PELVIS FINDINGS  Hepatobiliary: Too small to characterize lesion within medial segment left lobe that 7 mm on image 67. Normal gallbladder, without biliary ductal dilatation.  Pancreas: Normal, without mass or pancreatic ductal dilatation.  Spleen: Normal  Adrenals/Urinary tract: Mild right adrenal thickening. Left adrenal nodule is mildly irregular and measures 2.7 x 2.3 cm on image 63. Mildly malrotated left kidney. Punctate renal collecting system stones bilaterally. No hydronephrosis. Normal ureters and urinary bladder.  Stomach/Bowel: Normal stomach, without wall thickening. Pelvic floor laxity. Apparent soft  tissue fullness in the anorectal region is favored to be secondary. Normal terminal ileum and appendix. Normal small bowel.  Vascular/Lymphatic: Aortic and branch vessel atherosclerosis. No  abdominopelvic adenopathy. Significant atherosclerosis involving the superficial femoral arteries bilaterally. Apparent occlusion on the right, including on image 120 of series 2. Significant narrowing on the left on image 119 of series 2.  Reproductive: Retroverted uterus. Small calcifications within are likely dystrophic. Suspect small uterine fibroids, including posteriorly on image 106. No adnexal mass.  Other: No significant free fluid. No evidence of omental or peritoneal disease.  Musculoskeletal: Tiny sclerotic lesions within the pelvis are likely bone islands.  IMPRESSION: CT CHEST IMPRESSION  1. Large, partially obstructive distal esophageal primary with surrounding nodal metastasis. 2. Age advanced coronary artery atherosclerosis. Recommend assessment of coronary risk factors and consideration of medical therapy.  CT ABDOMEN AND PELVIS IMPRESSION  1. Indeterminate left hepatic lobe lesion. Slightly favored to represent a cyst, given well-circumscribed appearance on coronal reformatted images. 2. Left adrenal mass. Technically indeterminate. Given size, metastatic disease is a concern. Consider further evaluation with PET. This would also better evaluate the liver lesion. 3. Advanced atherosclerosis with apparent exclusion of the right superficial femoral artery and significant stenosis of the left superficial femoral artery. 4. Uterine fibroids. 5. Bilateral nephrolithiasis.   Electronically Signed   By: Abigail Miyamoto M.D.   On: 04/08/2014 14:50    Labs:  CBC:  Recent Labs  04/07/14 1310 04/08/14 0355 04/09/14 0540 04/11/14 0710  WBC 20.9* 19.2* 18.2* 18.6*  HGB 10.8* 10.2* 10.4* 10.4*  HCT 32.9* 31.4* 33.0* 33.1*  PLT 466* 415* 428* 392    COAGS:  Recent Labs  04/08/14 0355  INR 1.16  APTT 33    BMP:  Recent Labs  04/08/14 0355 04/08/14 1145  04/09/14 0540 04/10/14 0500 04/11/14 0413 04/11/14 0710  NA 140 142  --  142  --   --  139  K <2.0* <2.0*  < > 3.4* 3.2* 3.8  3.9  CL 95* 96  --  102  --   --  105  CO2 36* 36*  --  33*  --   --  25  GLUCOSE 125* 147*  --  120*  --   --  119*  BUN 9 8  --  <5*  --   --  <5*  CALCIUM 9.4 9.6  --  9.9  --   --  10.3  CREATININE 0.68 0.65  --  0.66  --   --  0.61  GFRNONAA >90 >90  --  >90  --   --  >90  GFRAA >90 >90  --  >90  --   --  >90  < > = values in this interval not displayed.  LIVER FUNCTION TESTS:  Recent Labs  04/07/14 1310 04/08/14 0355 04/09/14 0540 04/11/14 0710  BILITOT 0.5 1.1 0.8 0.7  AST 10 13 13 17   ALT 8 9 9 14   ALKPHOS 59 62 62 71  PROT 7.3 6.9 6.7 7.1  ALBUMIN 3.6 3.3* 3.2* 3.1*    TUMOR MARKERS: No results for input(s): AFPTM, CEA, CA199, CHROMGRNA in the last 8760 hours.  Assessment and Plan: Sheila Young is a 64 y.o. 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 She presented with progressive solid food dysphagia, weight loss, and intermittent nausea. She also has hx of CVA (last dose plavix 12/30), HTN, prior tobacco/alcohol abuse. The distal esophagus is partially obstructed secondary to cancer  with surrounding nodal metastasis. Request now received from oncology for both port a cath and percutaneous gastrostomy tube placements. Pt was initially refusing above procedures; however since family has arrived and further discussions held she has agreed to proceed. Imaging studies have been reviewed by Dr. Earleen Newport. Details/risks of procedures d/w pt/family- sister/[facility]) with their understanding and consent. Pt has leukocytosis (present since 12/31) but no significant fever or imaging to suggest acute infectious process. Case tent scheduled for 1/5 am.       I spent a total of 40 minutes face to face in clinical consultation, greater than 50% of which was counseling/coordinating care for port a cath/percutaneous gastrostomy tube placements.  Signed: Autumn Messing 04/11/2014, 3:31 PM

## 2014-04-11 NOTE — Progress Notes (Signed)
Sheila Young is doing pretty good. She has tolerated a full diet well. We will move ahead with a dysphagia diet. She says that she is hungry.  Her potassium was 3.8. We can probably pull back on the oral potassium.  I appreciate interventional radiology seen her.  I might sure if she is going to except having a feeding tube in. We will see how she does with the dysphagia diet.  Her CT scans did not show any obvious metastatic disease. There is some uncertain lesions in the liver. They may be areas of scar tissue. I probably would get an MRI to see what is going on.  I don't think we need an endoscopic ultrasound given the CT report.  On her physical exam, her temperature 98.7. Pulse 61. Blood pressure 153/67. Head and neck exam shows no ocular or oral lesions. No mucositis is noted. Lungs are clear. Cardiac exam regular rate and rhythm. Abdomen is soft. She has good bowel sounds. There is no palpable liver or spleen tip. Extremities shows no clubbing, cyanosis or edema.  She actually does look much better. The fluids certainly helped. The fact that she is eating and taking calories also is important.  I will go ahead and see about an MRI. I will adjust her IV fluids. We will see how she does with a dysphagia diet.  I appreciate all the gray care that she is getting from the staff on [place].  [place] 3:14

## 2014-04-12 ENCOUNTER — Inpatient Hospital Stay (HOSPITAL_COMMUNITY): Payer: Medicare Other

## 2014-04-12 ENCOUNTER — Ambulatory Visit
Admit: 2014-04-12 | Discharge: 2014-04-12 | Disposition: A | Discharge status: Home / Self Care | Payer: Medicare Other | Attending: Radiation Oncology | Admitting: Radiation Oncology

## 2014-04-12 DIAGNOSIS — C159 Malignant neoplasm of esophagus, unspecified: Secondary | ICD-10-CM

## 2014-04-12 LAB — COMPREHENSIVE METABOLIC PANEL
ALBUMIN: 3.2 g/dL — AB (ref 3.5–5.2)
ALT: 14 U/L (ref 0–35)
AST: 14 U/L (ref 0–37)
Alkaline Phosphatase: 75 U/L (ref 39–117)
Anion gap: 8 (ref 5–15)
BUN: 7 mg/dL (ref 6–23)
CALCIUM: 10.5 mg/dL (ref 8.4–10.5)
CO2: 26 mmol/L (ref 19–32)
Chloride: 105 mEq/L (ref 96–112)
Creatinine, Ser: 0.72 mg/dL (ref 0.50–1.10)
GFR calc Af Amer: >90 mL/min (ref >90)
GFR calc non Af Amer: >90 mL/min (ref >90)
Glucose, Bld: 108 mg/dL — ABNORMAL HIGH (ref 70–99)
Potassium: 3.5 mmol/L (ref 3.5–5.1)
Sodium: 139 mmol/L (ref 135–145)
Total Bilirubin: 0.6 mg/dL (ref 0.3–1.2)
Total Protein: 7.1 g/dL (ref 6.0–8.3)

## 2014-04-12 LAB — CBC
HCT: 34.5 % — ABNORMAL LOW (ref 36.0–46.0)
Hemoglobin: 10.8 g/dL — ABNORMAL LOW (ref 12.0–15.0)
MCH: 27.1 pg (ref 26.0–34.0)
MCHC: 31.3 g/dL (ref 30.0–36.0)
MCV: 86.5 fL (ref 78.0–100.0)
PLATELETS: 373 10*3/uL (ref 150–400)
RBC: 3.99 MIL/uL (ref 3.87–5.11)
RDW: 15.6 % — AB (ref 11.5–15.5)
WBC: 21.8 10*3/uL — AB (ref 4.0–10.5)

## 2014-04-12 LAB — GLUCOSE, CAPILLARY
Glucose-Capillary: 116 mg/dL — ABNORMAL HIGH (ref 70–99)
Glucose-Capillary: 97 mg/dL (ref 70–99)

## 2014-04-12 LAB — MAGNESIUM: Magnesium: 1.5 mg/dL (ref 1.5–2.5)

## 2014-04-12 MED ORDER — FENTANYL CITRATE 0.05 MG/ML IJ SOLN
INTRAMUSCULAR | Status: AC
  Filled 2014-04-12: qty 4

## 2014-04-12 MED ORDER — PRO-STAT SUGAR FREE PO LIQD
30.0000 mL | Freq: Every morning | ORAL | Status: DC
  Administered 2014-04-13 – 2014-04-17 (×5): 30 mL
  Filled 2014-04-12 (×7): qty 30

## 2014-04-12 MED ORDER — FENTANYL CITRATE 0.05 MG/ML IJ SOLN
INTRAMUSCULAR | Status: AC | PRN
  Administered 2014-04-12 (×3): 25 ug via INTRAVENOUS
  Administered 2014-04-12: 50 ug via INTRAVENOUS
  Administered 2014-04-12: 25 ug via INTRAVENOUS

## 2014-04-12 MED ORDER — POTASSIUM CHLORIDE 20 MEQ PO PACK
40.0000 meq | PACK | Freq: Every day | ORAL | Status: DC
  Filled 2014-04-12: qty 2

## 2014-04-12 MED ORDER — CEFAZOLIN SODIUM-DEXTROSE 2-3 GM-% IV SOLR
INTRAVENOUS | Status: AC
  Filled 2014-04-12: qty 50

## 2014-04-12 MED ORDER — GLUCAGON HCL RDNA (DIAGNOSTIC) 1 MG IJ SOLR
INTRAMUSCULAR | Status: AC
  Filled 2014-04-12: qty 1

## 2014-04-12 MED ORDER — ONDANSETRON HCL 4 MG/2ML IJ SOLN
INTRAMUSCULAR | Status: AC
  Filled 2014-04-12: qty 4

## 2014-04-12 MED ORDER — PANTOPRAZOLE SODIUM 40 MG IV SOLR
40.0000 mg | Freq: Every day | INTRAVENOUS | Status: DC
  Administered 2014-04-12 – 2014-04-13 (×3): 40 mg via INTRAVENOUS
  Filled 2014-04-12 (×4): qty 40

## 2014-04-12 MED ORDER — OSMOLITE 1.5 CAL PO LIQD
1000.0000 mL | ORAL | Status: DC
  Administered 2014-04-13 – 2014-04-14 (×2): 1000 mL
  Filled 2014-04-12 (×6): qty 1000

## 2014-04-12 MED ORDER — THIAMINE HCL 100 MG/ML IJ SOLN
100.0000 mg | Freq: Every day | INTRAMUSCULAR | Status: DC
  Administered 2014-04-12 – 2014-04-13 (×2): 100 mg via INTRAVENOUS
  Filled 2014-04-12 (×3): qty 1

## 2014-04-12 MED ORDER — MIDAZOLAM HCL 2 MG/2ML IJ SOLN
INTRAMUSCULAR | Status: AC | PRN
  Administered 2014-04-12 (×2): 0.5 mg via INTRAVENOUS
  Administered 2014-04-12: 1 mg via INTRAVENOUS
  Administered 2014-04-12 (×4): 0.5 mg via INTRAVENOUS

## 2014-04-12 MED ORDER — POTASSIUM CHLORIDE CRYS ER 20 MEQ PO TBCR
40.0000 meq | EXTENDED_RELEASE_TABLET | Freq: Every day | ORAL | Status: DC
  Filled 2014-04-12: qty 2

## 2014-04-12 MED ORDER — FOLIC ACID 5 MG/ML IJ SOLN
1.0000 mg | Freq: Every day | INTRAMUSCULAR | Status: DC
  Administered 2014-04-13: 1 mg via INTRAVENOUS
  Filled 2014-04-12 (×3): qty 0.2

## 2014-04-12 MED ORDER — JEVITY 1.2 CAL PO LIQD: 1000.0000 mL | ORAL | Status: DC

## 2014-04-12 MED ORDER — LIDOCAINE HCL 1 % IJ SOLN
INTRAMUSCULAR | Status: AC
  Filled 2014-04-12: qty 40

## 2014-04-12 MED ORDER — POTASSIUM CHLORIDE 20 MEQ/15ML (10%) PO SOLN
40.0000 meq | Freq: Every day | ORAL | Status: DC
  Administered 2014-04-12 – 2014-04-13 (×2): 40 meq via ORAL
  Filled 2014-04-12 (×3): qty 30

## 2014-04-12 MED ORDER — ONDANSETRON HCL 4 MG/2ML IJ SOLN
INTRAMUSCULAR | Status: AC | PRN
  Administered 2014-04-12: 8 mg via INTRAVENOUS

## 2014-04-12 MED ORDER — BACITRACIN-NEOMYCIN-POLYMYXIN 400-5-5000 EX OINT
1.0000 "application " | TOPICAL_OINTMENT | Freq: Every day | CUTANEOUS | Status: AC
  Administered 2014-04-14 – 2014-04-18 (×5): 1 via TOPICAL
  Filled 2014-04-12 (×6): qty 1

## 2014-04-12 MED ORDER — MIDAZOLAM HCL 2 MG/2ML IJ SOLN
INTRAMUSCULAR | Status: AC
  Filled 2014-04-12: qty 8

## 2014-04-12 NOTE — Progress Notes (Signed)
NUTRITION FOLLOW-UP  INTERVENTION: -Initiate Osmolite 1.5 via G-tube at 15 ml/hr, increase by 10 ml every 4 hours to reach goal rate of 55 ml/hr.  -30 ml Prostat daily -TF regimen provides 2080 kcal (100% of needs), 98g of protein (100% of needs), and 1006 ml of free water. Pt will need an additional 894 ml of free water. -Discontinue Resource Breeze po TID, each supplement provides 250 kcal and 9 grams of protein -Discontinue Magic cup BID with meals, each supplement provides 290 kcal and 9 grams of protein -RD to continue to monitor  NUTRITION DIAGNOSIS: Inadequate oral intake related to dysphagia as evidenced by poor PO intake x 2 months, continues  Goal: Pt to meet >/= 90% of their estimated nutrition needs, unmet   Monitor:  TF initiation and tolerance , weight, labs, I/O's  ASSESSMENT: 64 y.o. female with a new diagnosis of Squamous Cell Carcinoma of the Esophagus being admitted for initiation of treatment as patient is symptomatic for severe dysphagia.  In review, she presented to her physician with a two-month history of progressive dysphagia,especially with solids, with subsequent 25 pound weight loss. She has been able to tolerate liquids.  Per student RN note, Pt has been unable to tolerate her diet (Dys 2), pt appeared to be choking.  1/5 G-tube placed. RD consulted to initiate tube feeding.  Labs reviewed: WNL  Height: Ht Readings from Last 1 Encounters:  04/07/14 5\' 6"  (1.676 m)    Weight: Wt Readings from Last 1 Encounters:  04/12/14 139 lb 6.4 oz (63.231 kg)   BMI:  Body mass index is 22.51 kg/(m^2).  Estimated Nutritional Needs: Kcal: 1900-2100 Protein: 95-105g Fluid: 1.9L/day  Skin: intact  Diet Order: Diet NPO time specified Except for: Sips with Meds  EDUCATION NEEDS: -No education needs identified at this time   Intake/Output Summary (Last 24 hours) at 04/12/14 1303 Last data filed at 04/12/14 0800  Gross per 24 hour  Intake 531.67 ml   Output      0 ml  Net 531.67 ml    Last BM: 1/4  Labs:   Recent Labs Lab 04/09/14 0540  04/11/14 0413 04/11/14 0710 04/12/14 0416  NA 142  --   --  139 139  K 3.4*  < > 3.8 3.9 3.5  CL 102  --   --  105 105  CO2 33*  --   --  25 26  BUN <5*  --   --  <5* 7  CREATININE 0.66  --   --  0.61 0.72  CALCIUM 9.9  --   --  10.3 10.5  MG 1.8  --   --  1.4* 1.5  GLUCOSE 120*  --   --  119* 108*  < > = values in this interval not displayed.  CBG (last 3)  No results for input(s): GLUCAP in the last 72 hours.  Scheduled Meds: . amLODipine  10 mg Oral Daily  . atorvastatin  40 mg Oral q1800  . enoxaparin (LOVENOX) injection  40 mg Subcutaneous Q24H  . feeding supplement (RESOURCE BREEZE)  1 Container Oral TID BM  . folic acid  1 mg Intravenous Daily  . lidocaine      . lisinopril  40 mg Oral Daily  . magnesium gluconate  500 mg Oral BID  . neomycin-bacitracin-polymyxin  1 application Topical Daily  . ondansetron      . pantoprazole (PROTONIX) IV  40 mg Intravenous QHS  . potassium chloride  40 mEq Oral  Daily  . thiamine IV  100 mg Intravenous Daily    Continuous Infusions: . dextrose 5 % and 0.9 % NaCl with KCl 40 mEq/L 50 mL/hr at 04/11/14 1403  . feeding supplement (OSMOLITE 1.5 CAL)       Clayton Bibles, MS, RD, LDN Pager: [phone] After Hours Pager: [phone]

## 2014-04-12 NOTE — Progress Notes (Signed)
PT Note:  Received PT orders to evaluate patient 04/13/14.  Will check back tomorrow to evaluate. Thank you for the referral. [facility] [id number]

## 2014-04-12 NOTE — Care Management Note (Signed)
CARE MANAGEMENT NOTE 04/12/2014  Patient:  Sheila Young, Sheila Young   Account Number:  [id number]  Date Initiated:  04/12/2014  Documentation initiated by:  Marney Doctor  Subjective/Objective Assessment:   64 yo admitted with Esophageal CA     Action/Plan:   From home alone   Anticipated DC Date:  04/18/2014   Anticipated DC Plan:  [place]  CM consult      Choice offered to / List presented to:             Status of service:  In process, will continue to follow Medicare Important Message given?   (If response is "NO", the following Medicare IM given date fields will be blank) Date Medicare IM given:   Medicare IM given by:   Date Additional Medicare IM given:   Additional Medicare IM given by:    Discharge Disposition:    Per UR Regulation:  Reviewed for med. necessity/level of care/duration of stay  If discussed at [place] of Stay Meetings, dates discussed:    Comments:  04/12/14 Marney Doctor RN,BSN,NCM [phone] CHart reviewed and CM following for DC needs.  PT evaluation has been ordered.  Will await PT recommendations and assist with DC planning as needed.

## 2014-04-12 NOTE — Procedures (Signed)
Interventional Radiology Procedure Note  Procedure: Placement of percutaneous, push, 86F, balloon retention gastrostomy tube. Complications: None Recommendations: - NPO except for sips and chips remainder of today and overnight - Maintain G-tube to LWS until tomorrow morning  - May advance diet as tolerated and begin using tube tomorrow morning - 3 T-tacks placed for support.  2 are anchored to the tube & at the skin.  1 was cut during procedure.   - 2 t-tacks that are anchored will need to be cut/released at the skin in 5-7 days.   Signed,   Dulcy Fanny. Earleen Newport, DO

## 2014-04-12 NOTE — Procedures (Signed)
Interventional Radiology Procedure Note  Procedure: Placement of a right IJ approach single lumen PowerPort.  Tip is positioned at the superior cavoatrial junction and catheter is ready for immediate use.  Complications: No immediate Recommendations:  - Ok to shower tomorrow - Do not submerge for 7 days - Routine line care   Signed,  Temika Sutphin S. Ivania Teagarden, DO    

## 2014-04-12 NOTE — Progress Notes (Signed)
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 female who is seen out courtesy of Dr. Burney Gauze for an opinion concerning radiation therapy as part of management of patient's recently diagnosed esophageal cancer..she presented to her physician with a two-month history of progressive dysphagia,especially with solids, with subsequent 25 pound weight loss. She has been able to tolerate liquids. She has had an increase saliva production. She denies any vomiting, but she does report intermittent nausea She denies any shortness of breath, cough or cardiac complaints. No bleeding issues such as hemoptysis or hematemesis. She denies any pain at this time. She denies any fever. She patient has a history of Prior CVA,Tobacco and alcohol abuse. She was seen by ENT there referred for upper endoscopy, remarkable for a GI mass seen in the distal esophagus. EGD on December 23 showed this mass  30 cm down from the insisors to the EG junction. Biopsies were taken, and the Esophagus was dilated. Pathology reports, case number [id number] was consistent with squamous cell carcinoma. The patient was recently admitted to the hospital to facilitate workup and for placement of a feeding tube as well as Port-A-Cath for anticipated chemotherapy. Imaging including a CT scan of the chest abdomen and pelvis as well as abdominal MRI has been performed. Radiation therapy is been consulted for consideration for combined modality therapy.  PREVIOUS RADIATION THERAPY: No  PAST MEDICAL HISTORY:  has a past medical history of Stroke;  Hypercholesteremia; Hypertension; Hypokalemia; History of noncompliance with medical treatment; High aldosterone; Anger; Mammogram abnormal; Esophagus cancer (04/07/2014); and Mechanical dysphagia (04/07/2014).    PAST SURGICAL HISTORY:No past surgical history on file.  FAMILY HISTORY: family history is not on file.  SOCIAL HISTORY:  reports that she has been smoking Cigarettes.  She started smoking about 45 years ago. She has a 22.5 pack-year smoking history. She has never used smokeless tobacco. She reports that she does not drink alcohol.  ALLERGIES: Review of patient's allergies indicates no known allergies.  MEDICATIONS:  No current facility-administered medications for this encounter.   No current outpatient prescriptions on file.   Facility-Administered Medications Ordered in Other Encounters  Medication Dose Route Frequency Provider Last Rate Last Dose  . amLODipine (NORVASC) tablet 10 mg  10 mg Oral Daily Volanda Napoleon, MD   10 mg at 04/12/14 1200  . atorvastatin (LIPITOR) tablet 40 mg  40 mg Oral q1800 Volanda Napoleon, MD   40 mg at 04/11/14 1649  . dextrose 5 % and 0.9 % NaCl with KCl 40 mEq/L infusion   Intravenous Continuous Volanda Napoleon, MD 50 mL/hr at 04/11/14 1403    . enoxaparin (LOVENOX) injection 40 mg  40 mg Subcutaneous Q24H D Kevin Allred, PA-C   Stopped at 04/11/14 1800  . feeding supplement (OSMOLITE 1.5 CAL) liquid 1,000 mL  1,000 mL Per Tube Continuous Clayton Bibles, RD      . feeding supplement (PRO-STAT SUGAR FREE 64) liquid 30 mL  30 mL Per Tube q morning - 10a Clayton Bibles, RD      . folic acid injection 1 mg  1 mg Intravenous Daily Volanda Napoleon, MD      .  lidocaine (XYLOCAINE) 1 % (with pres) injection           . lisinopril (PRINIVIL,ZESTRIL) tablet 40 mg  40 mg Oral Daily Volanda Napoleon, MD   40 mg at 04/12/14 1200  . magnesium gluconate (MAGONATE) tablet 500 mg  500 mg Oral BID Volanda Napoleon, MD   500 mg at 04/11/14 2211  .  neomycin-bacitracin-polymyxin (NEOSPORIN) ointment 1 application  1 application Topical Daily Corrie Mckusick, DO      . ondansetron (ZOFRAN) 4 MG/2ML injection           . oxyCODONE (ROXICODONE INTENSOL) 20 MG/ML concentrated solution 5 mg  5 mg Oral Q4H PRN Volanda Napoleon, MD      . pantoprazole (PROTONIX) injection 40 mg  40 mg Intravenous QHS Volanda Napoleon, MD   40 mg at 04/12/14 1423  . potassium chloride 20 MEQ/15ML (10%) solution 40 mEq  40 mEq Oral Daily Volanda Napoleon, MD   40 mEq at 04/12/14 1422  . thiamine (B-1) injection 100 mg  100 mg Intravenous Daily Volanda Napoleon, MD   100 mg at 04/12/14 1423    REVIEW OF SYSTEMS:  A 15 point review of systems is documented in the electronic medical record. This was obtained by the nursing staff. However, I reviewed this with the patient to discuss relevant findings and make appropriate changes.  She is able to swallow some soft foods as well as liquids. She does have some pain with swallowing. She is able to swallow her secretions at this time. Patient denies any pain within the chest area headaches or double vision.   PHYSICAL EXAM:  Vitals - 1 value per visit 08/09/6268  SYSTOLIC 350  DIASTOLIC 76  Pulse 80  Temperature 98.5  Respirations 18  Weight (lb) 139.4  Height   BMI   VISIT REPORT    This is a pleasant 64 year old female lying in her hospital bed area no family members are present at this time. The patient responds appropriately to questions but does not elaborate. Examination of the neck and supraclavicular region reveals no palpable adenopathy. The axillary areas are free of adenopathy. The lungs are clear to auscultation. The heart has regular rhythm and rate. Patient has a bandage in place in the right upper chest from recent Port-A-Cath placement. She also has a bandage in place along the abdominal area from feeding tube placement. Bowel sounds are normal. The patient has good strength in all 4 extremities. Peripheral pulses  are intact. No significant edema noted in the extremities.   ECOG = 2   2 - Symptomatic, <50% in bed during the day (Ambulatory and capable of all self care but unable to carry out any work activities. Up and about more than 50% of waking hours) f the [facility]". [place] Oncol. 5 (6): 649-55  LABORATORY DATA:  Lab Results  Component Value Date   WBC 21.8* 04/12/2014   HGB 10.8* 04/12/2014   HCT 34.5* 04/12/2014   MCV 86.5 04/12/2014   PLT 373 04/12/2014   NEUTROABS 16.4* 04/07/2014   Lab Results  Component Value Date   NA 139 04/12/2014   K 3.5 04/12/2014   CL 105 04/12/2014   CO2 26 04/12/2014   GLUCOSE 108* 04/12/2014   CREATININE 0.72 04/12/2014   CALCIUM 10.5 04/12/2014      RADIOGRAPHY: Ct Chest W Contrast  04/08/2014   CLINICAL DATA:  Esophageal cancer on endoscopy yesterday. Dysphagia. Evaluate for  metastasis.  EXAM: CT CHEST, ABDOMEN, AND PELVIS WITH CONTRAST  TECHNIQUE: Multidetector CT imaging of the chest, abdomen and pelvis was performed following the standard protocol during bolus administration of intravenous contrast.  CONTRAST:  119mL OMNIPAQUE IOHEXOL 300 MG/ML  SOLN  COMPARISON:  Esophagram of 03/28/2014.  FINDINGS: CT CHEST FINDINGS  Lungs/Pleura: Moderate centrilobular emphysema. Mild biapical pleural parenchymal scarring.  Dependent atelectasis or scarring at the lung bases, greater on the left. Mild interstitial thickening of the left apex, favored to represent scarring.  No pleural fluid.  Heart/Mediastinum: No supraclavicular adenopathy. Aortic and branch vessel atherosclerosis. Mild cardiomegaly. Multivessel coronary artery atherosclerosis. No central pulmonary embolism, on this non-dedicated study. No hilar adenopathy.  Lungs segment distal esophageal carcinoma with marked soft tissue thickening, including on image 44 of series 2. This is partially obstructive, as evidenced by dilatation more proximally with fluid level within.   Periesophageal adenopathy. Example 1.0 cm left-sided node on image 33 of series 2. There is likely adenopathy along the periaortic space on image 26 of series 2. 1.0 cm. Retroesophageal node measures 1.4 cm on image 27.  CT ABDOMEN AND PELVIS FINDINGS  Hepatobiliary: Too small to characterize lesion within medial segment left lobe that 7 mm on image 67. Normal gallbladder, without biliary ductal dilatation.  Pancreas: Normal, without mass or pancreatic ductal dilatation.  Spleen: Normal  Adrenals/Urinary tract: Mild right adrenal thickening. Left adrenal nodule is mildly irregular and measures 2.7 x 2.3 cm on image 63. Mildly malrotated left kidney. Punctate renal collecting system stones bilaterally. No hydronephrosis. Normal ureters and urinary bladder.  Stomach/Bowel: Normal stomach, without wall thickening. Pelvic floor laxity. Apparent soft tissue fullness in the anorectal region is favored to be secondary. Normal terminal ileum and appendix. Normal small bowel.  Vascular/Lymphatic: Aortic and branch vessel atherosclerosis. No abdominopelvic adenopathy. Significant atherosclerosis involving the superficial femoral arteries bilaterally. Apparent occlusion on the right, including on image 120 of series 2. Significant narrowing on the left on image 119 of series 2.  Reproductive: Retroverted uterus. Small calcifications within are likely dystrophic. Suspect small uterine fibroids, including posteriorly on image 106. No adnexal mass.  Other: No significant free fluid. No evidence of omental or peritoneal disease.  Musculoskeletal: Tiny sclerotic lesions within the pelvis are likely bone islands.  IMPRESSION: CT CHEST IMPRESSION  1. Large, partially obstructive distal esophageal primary with surrounding nodal metastasis. 2. Age advanced coronary artery atherosclerosis. Recommend assessment of coronary risk factors and consideration of medical therapy.  CT ABDOMEN AND PELVIS IMPRESSION  1. Indeterminate left hepatic  lobe lesion. Slightly favored to represent a cyst, given well-circumscribed appearance on coronal reformatted images. 2. Left adrenal mass. Technically indeterminate. Given size, metastatic disease is a concern. Consider further evaluation with PET. This would also better evaluate the liver lesion. 3. Advanced atherosclerosis with apparent exclusion of the right superficial femoral artery and significant stenosis of the left superficial femoral artery. 4. Uterine fibroids. 5. Bilateral nephrolithiasis.   Electronically Signed   By: Abigail Miyamoto M.D.   On: 04/08/2014 14:50   Mr Abdomen W Wo Contrast  04/11/2014   CLINICAL DATA:  Esophageal cancer.  Hepatic and adrenal lesions.  EXAM: MRI ABDOMEN WITHOUT AND WITH CONTRAST  TECHNIQUE: Multiplanar multisequence MR imaging of the abdomen was performed both before and after the administration of intravenous contrast.  CONTRAST:  52mL MULTIHANCE GADOBENATE DIMEGLUMINE 529 MG/ML IV SOLN  COMPARISON:  04/08/2014  FINDINGS: Despite efforts by the technologist and patient, motion artifact is present on today's exam  and could not be eliminated. This reduces exam sensitivity and specificity.  Lower chest: Large circumferential mass of the distal esophagus, 5.7 by 3.5 cm on image 3 6 series 3. The mass indents the posterior margin of the left atrium. Restricted diffusion observed in the mass. Mild scarring or atelectasis in the left lower lobe.  Hepatobiliary: The tiny lesion in segment 4 has fluid signal intensity characteristics favoring small cyst. There is diffuse abnormal low signal throughout the hepatic parenchyma on essentially all sequences.  Pancreas: Unremarkable  Spleen: Diffuse abnormal low signal on all sequences throughout the parenchyma.  Adrenals/Urinary Tract: 2.7 by 3.0 cm left adrenal mass has extremely low signal/signal void on all sequences including pre and postcontrast sequences.  Stomach/Bowel: Unremarkable except in the distal esophagus as noted above.   Vascular/Lymphatic: High intravascular signal on precontrast images. I spoke with the technologist who confirmed to that the precontrast images did not include accidental early contrast injection.  Musculoskeletal: Low marrow signal on gradient images.  IMPRESSION: 1. Today' s exam demonstrates a very unusual constellation of imaging findings including very low hepatic and splenic parenchymal signal, extremely low signal of the left adrenal mass on all sequences, and low gradient signal in the marrow. Also, the precontrast sequences demonstrate high-signal in all vascular structures, very similar in appearance to as if contrast had already been administered (I confirmed with the technologist thick contrast was not administered early). This atypical and confounding appearance might be due to the paramagnetic effects of recent magnesium supplementation, combined with possible hemochromatosis. 2. Large enhancing distal esophageal tumor. The left adrenal gland does not demonstrate appreciable enhancement, although given the diffuse signal void in this vicinity, I am reluctant to rule out a confounding effect which might obscure tumor enhancement. Nuclear medicine PET-CT should be strongly considered. 3. The small segment IV lesion in the liver has a fairly convincing appearance for cyst, and is not observed to enhance.   Electronically Signed   By: Sherryl Barters M.D.   On: 04/11/2014 10:20   Ct Abdomen Pelvis W Contrast  04/08/2014   CLINICAL DATA:  Esophageal cancer on endoscopy yesterday. Dysphagia. Evaluate for metastasis.  EXAM: CT CHEST, ABDOMEN, AND PELVIS WITH CONTRAST  TECHNIQUE: Multidetector CT imaging of the chest, abdomen and pelvis was performed following the standard protocol during bolus administration of intravenous contrast.  CONTRAST:  159mL OMNIPAQUE IOHEXOL 300 MG/ML  SOLN  COMPARISON:  Esophagram of 03/28/2014.  FINDINGS: CT CHEST FINDINGS  Lungs/Pleura: Moderate centrilobular emphysema. Mild  biapical pleural parenchymal scarring.  Dependent atelectasis or scarring at the lung bases, greater on the left. Mild interstitial thickening of the left apex, favored to represent scarring.  No pleural fluid.  Heart/Mediastinum: No supraclavicular adenopathy. Aortic and branch vessel atherosclerosis. Mild cardiomegaly. Multivessel coronary artery atherosclerosis. No central pulmonary embolism, on this non-dedicated study. No hilar adenopathy.  Lungs segment distal esophageal carcinoma with marked soft tissue thickening, including on image 44 of series 2. This is partially obstructive, as evidenced by dilatation more proximally with fluid level within.  Periesophageal adenopathy. Example 1.0 cm left-sided node on image 33 of series 2. There is likely adenopathy along the periaortic space on image 26 of series 2. 1.0 cm. Retroesophageal node measures 1.4 cm on image 27.  CT ABDOMEN AND PELVIS FINDINGS  Hepatobiliary: Too small to characterize lesion within medial segment left lobe that 7 mm on image 67. Normal gallbladder, without biliary ductal dilatation.  Pancreas: Normal, without mass or pancreatic ductal dilatation.  Spleen:  Normal  Adrenals/Urinary tract: Mild right adrenal thickening. Left adrenal nodule is mildly irregular and measures 2.7 x 2.3 cm on image 63. Mildly malrotated left kidney. Punctate renal collecting system stones bilaterally. No hydronephrosis. Normal ureters and urinary bladder.  Stomach/Bowel: Normal stomach, without wall thickening. Pelvic floor laxity. Apparent soft tissue fullness in the anorectal region is favored to be secondary. Normal terminal ileum and appendix. Normal small bowel.  Vascular/Lymphatic: Aortic and branch vessel atherosclerosis. No abdominopelvic adenopathy. Significant atherosclerosis involving the superficial femoral arteries bilaterally. Apparent occlusion on the right, including on image 120 of series 2. Significant narrowing on the left on image 119 of series  2.  Reproductive: Retroverted uterus. Small calcifications within are likely dystrophic. Suspect small uterine fibroids, including posteriorly on image 106. No adnexal mass.  Other: No significant free fluid. No evidence of omental or peritoneal disease.  Musculoskeletal: Tiny sclerotic lesions within the pelvis are likely bone islands.  IMPRESSION: CT CHEST IMPRESSION  1. Large, partially obstructive distal esophageal primary with surrounding nodal metastasis. 2. Age advanced coronary artery atherosclerosis. Recommend assessment of coronary risk factors and consideration of medical therapy.  CT ABDOMEN AND PELVIS IMPRESSION  1. Indeterminate left hepatic lobe lesion. Slightly favored to represent a cyst, given well-circumscribed appearance on coronal reformatted images. 2. Left adrenal mass. Technically indeterminate. Given size, metastatic disease is a concern. Consider further evaluation with PET. This would also better evaluate the liver lesion. 3. Advanced atherosclerosis with apparent exclusion of the right superficial femoral artery and significant stenosis of the left superficial femoral artery. 4. Uterine fibroids. 5. Bilateral nephrolithiasis.   Electronically Signed   By: Abigail Miyamoto M.D.   On: 04/08/2014 14:50   Ir Gastrostomy Tube Mod Sed  04/12/2014   CLINICAL DATA:  64 year old female with a history of esophageal cancer. She has been referred for a percutaneous gastrostomy tube placement.  The patient has tumor at the distal aspect of her esophagus. There is literature suggesting in increased risk of tumor tract seeding with pull-through gastrostomy in this patient population.  The patient will undergo a push gastrostomy for placement of percutaneous gastrostomy.  EXAM: PERCUTANEOUS GASTROSTOMY  FLUOROSCOPY TIME:  4 min minutes, 42 seconds  MEDICATIONS AND MEDICAL HISTORY: Versed 2.0 mg, Fentanyl 50 mcg.  ANESTHESIA/SEDATION: Moderate sedation time: 24 minutes  CONTRAST:  20 cc of Omni 300 enteric   PROCEDURE: The procedure, risks, benefits, and alternatives were explained to the patient. Questions regarding the procedure were encouraged and answered. The patient understands and consents to the procedure.  The epigastrium was prepped with Betadine in a sterile fashion, and a sterile drape was applied covering the operative field. A sterile gown and sterile gloves were used for the procedure.  A 5-French orogastric tube is placed under fluoroscopic guidance.  Lateral fluoroscopy image demonstrates the gastric bubble at the most anterior aspect of the abdominal cavity with no interposed colon. Anterior view demonstrates: Along the greater curvature of the stomach, with intact gastrocolic ligament.  The stomach was distended with gas. Under fluoroscopic guidance, 3 separate T tacks were placed along the anterior body of the stomach. This was placed in a try dense formation for the future central placement of the gastrostomy tube via a push method. Each of the 3 T tacks were confirmed in position with infusion of contrast into the stomach lumen. Images were stored and sent to PACs.  Under fluoroscopic guidance, an 18 gauge needle was utilized to puncture the anterior wall of the body of the  stomach within the central aspect of the T tacks. Contrast confirmed position of the needle tip within the stomach lumen. An Amplatz wire was advanced through the needle. Serial dilation of the subcutaneous tissues was performed with [place] dilator and an 46 [place] dilator. Subsequently, an 46 French peel-away sheath was placed.  An 23 French balloon retention gastrostomy was then placed through the peel-away sheath. Contrast confirmed position of the tip of the catheter within the stomach lumen. The balloon was inflated with 5 cc of sterile saline, and withdrawn to the anterior surface of the stomach. The retention disc was brought to the skin surface.  The patient tolerated the procedure well and remained hemodynamically  stable throughout.  No complications were encountered and no significant blood loss was encountered.  FINDINGS: The image demonstrates placement of a 57 French push gastrostomy tube into the anterior body of the stomach. Three retention sutures were placed for gastropexy.  IMPRESSION: Status post placement of a 58 French balloon retention gastrostomy via the push method.  PLAN: The gastrostomy tube should be to low wall suction for 24 hr.  The tube should not be used for medications for 24 hr until the tract matures.  The tube may be flushed with 10 cc of sterile water once per shift.  Two of the T tacks were anchored at the skin level, and should be ligated 5 to 7 days from now. The third T tack was not anchored at the skin as the suture was cut during the procedure.  Signed,  Dulcy Fanny. Earleen Newport, DO  [facility]  [facility]   Electronically Signed   By: Corrie Mckusick D.O.   On: 04/12/2014 10:47   Ir Fluoro Guide Cv Line Right  04/12/2014   CLINICAL DATA:  64 year old female with a history of esophageal carcinoma. She has been referred for port catheter placement.  EXAM: IR ULTRASOUND GUIDANCE VASC ACCESS RIGHT; IR RIGHT FLOURO GUIDE CV LINE  Date: 04/12/2014  ANESTHESIA/SEDATION: Moderate (conscious) sedation was administered during this procedure. A total of 2.0 mg Versed and 100 mg Fentanyl were administered intravenously. The patient's vital signs were monitored continuously by radiology nursing throughout the course of the procedure.  Total sedation time: 25 minutes  FLUOROSCOPY TIME:  Eighteen seconds  TECHNIQUE: The right neck and chest was prepped with chlorhexidine, and draped in the usual sterile fashion using maximum barrier technique (cap and mask, sterile gown, sterile gloves, large sterile sheet, hand hygiene and cutaneous antiseptic). Antibiotic prophylaxis was provided with 2.0g Ancef administered IV one hour prior to skin incision. Local anesthesia was  attained by infiltration with 1% lidocaine without epinephrine.  Ultrasound demonstrated patency of the right internal jugular vein, and this was documented with an image. Under real-time ultrasound guidance, this vein was accessed with a 21 gauge micropuncture needle and image documentation was performed. A small dermatotomy was made at the access site with an 11 scalpel. A 0.018" wire was advanced into the SVC and the access needle exchanged for a 34F micropuncture vascular sheath. The 0.018" wire was then removed and a 0.035" wire advanced into the IVC.  An appropriate location for the subcutaneous reservoir was selected below the clavicle and an incision was made through the skin and underlying soft tissues. The subcutaneous tissues were then dissected using a combination of blunt and sharp surgical technique and a pocket was formed. A single lumen power injectable portacatheter was then tunneled through the subcutaneous tissues from the pocket to the  dermatotomy and the port reservoir placed within the subcutaneous pocket.  The venous access site was then serially dilated and a peel away vascular sheath placed over the wire. The wire was removed and the port catheter advanced into position under fluoroscopic guidance. The catheter tip is positioned in the upper right atrium. This was documented with a spot image. The portacatheter was then tested and found to flush and aspirate well. The port was flushed with saline followed by 100 units/mL heparinized saline.  The pocket was then closed in two layers using first subdermal inverted interrupted absorbable sutures followed by a running subcuticular suture. The epidermis was then sealed with Dermabond. The dermatotomy at the venous access site was also sealed with Dermabond.  COMPLICATIONS: None.  The patient tolerated the procedure well.  IMPRESSION: Status post placement of single-lumen port catheter on the right chest wall via the right IJ vein. Catheter ready  for use.  Signed,  Dulcy Fanny. Earleen Newport, DO  [facility]  [facility]   Electronically Signed   By: Corrie Mckusick D.O.   On: 04/12/2014 10:40   Ir US Guide Vasc Access Right  04/12/2014   CLINICAL DATA:  64 year old female with a history of esophageal carcinoma. She has been referred for port catheter placement.  EXAM: IR ULTRASOUND GUIDANCE VASC ACCESS RIGHT; IR RIGHT FLOURO GUIDE CV LINE  Date: 04/12/2014  ANESTHESIA/SEDATION: Moderate (conscious) sedation was administered during this procedure. A total of 2.0 mg Versed and 100 mg Fentanyl were administered intravenously. The patient's vital signs were monitored continuously by radiology nursing throughout the course of the procedure.  Total sedation time: 25 minutes  FLUOROSCOPY TIME:  Eighteen seconds  TECHNIQUE: The right neck and chest was prepped with chlorhexidine, and draped in the usual sterile fashion using maximum barrier technique (cap and mask, sterile gown, sterile gloves, large sterile sheet, hand hygiene and cutaneous antiseptic). Antibiotic prophylaxis was provided with 2.0g Ancef administered IV one hour prior to skin incision. Local anesthesia was attained by infiltration with 1% lidocaine without epinephrine.  Ultrasound demonstrated patency of the right internal jugular vein, and this was documented with an image. Under real-time ultrasound guidance, this vein was accessed with a 21 gauge micropuncture needle and image documentation was performed. A small dermatotomy was made at the access site with an 11 scalpel. A 0.018" wire was advanced into the SVC and the access needle exchanged for a 79F micropuncture vascular sheath. The 0.018" wire was then removed and a 0.035" wire advanced into the IVC.  An appropriate location for the subcutaneous reservoir was selected below the clavicle and an incision was made through the skin and underlying soft tissues. The subcutaneous tissues were then dissected using  a combination of blunt and sharp surgical technique and a pocket was formed. A single lumen power injectable portacatheter was then tunneled through the subcutaneous tissues from the pocket to the dermatotomy and the port reservoir placed within the subcutaneous pocket.  The venous access site was then serially dilated and a peel away vascular sheath placed over the wire. The wire was removed and the port catheter advanced into position under fluoroscopic guidance. The catheter tip is positioned in the upper right atrium. This was documented with a spot image. The portacatheter was then tested and found to flush and aspirate well. The port was flushed with saline followed by 100 units/mL heparinized saline.  The pocket was then closed in two layers using first subdermal inverted interrupted absorbable sutures followed  by a running subcuticular suture. The epidermis was then sealed with Dermabond. The dermatotomy at the venous access site was also sealed with Dermabond.  COMPLICATIONS: None.  The patient tolerated the procedure well.  IMPRESSION: Status post placement of single-lumen port catheter on the right chest wall via the right IJ vein. Catheter ready for use.  Signed,  Dulcy Fanny. Earleen Newport, DO  [facility]  [facility]   Electronically Signed   By: Corrie Mckusick D.O.   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 Patient does have peri-Esophageal lymphadenopathy which could be encompassed in a radiation field. Patient will be a good candidate for combined modality therapy with approximately 5 and half weeks of radiation therapy along with radiosensitizing chemotherapy. I discussed the treatment course side effects and potential toxicities of radiation therapy in this situation with the patient. She appears to  understand and wishes to proceed with planned course of treatment.  PLAN:simulation and planning January 6 with treatments likely to begin the week of January 11 along with radiosensitizing chemotherapy. PET scan will be performed as an outpatient.   ------------------------------------------------  Blair Promise, PhD, MD

## 2014-04-12 NOTE — Progress Notes (Signed)
Ms. Hukill has now agreed to have the feeding tube and the port placed. Her family came over and talk to her. I also think that the fact that she cannot tolerate a mechanical soft diet has helped her with her decisions.  The MRI of the liver does not show any metastasis.  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 Again, this is him that we can cure with radiation and chemotherapy.  I will speak with radiation oncology today.  She is not hurting. She is not having any shortness of breath.  Her potassium is 3.5. I will have to probably give her some supplemental potassium in addition to what is in her IV fluids.  She's having no problems with bleeding.  I think that some physical therapy may not be about idea for her. She really has not gotten out of bed. 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 Hopefully, she will allow physical therapy to help her.  There is no issues with fever. She has no rashes. There is no leg swelling.  On physical exam, her vital signs are stable. Temperature 98.2. Pulse 59. Blood pressure 129/67. Her lungs are clear. Cardiac exam regular rate and rhythm. Abdomen is soft. Extremities shows no clubbing, cyanosis or edema. Skin exam no rashes. Neurological exam is nonfocal.  I think that we are now in a position that we can fine start to move ahead and get treatment planned.  I would I consider radiation therapy with weekly chemotherapy. I don't think she will be a good candidate for a 5-FU pump or for oral chemotherapy.  I appreciate interventional radiology's help. I totally apologize for her "change of mind" yesterday. I think that she now is "on board" with what needs to get done.  Her family clearly is going to help [place] out and make sure that she gets what she needs. They are very very nice and they really want her to do well.  I appreciate all the great care that she is getting. The nurses have done a fantastic job in trying to  manage her and to help her when she has her periods of "aggravation".  Lum Keas  Proverbs 17:6

## 2014-04-12 NOTE — Sedation Documentation (Signed)
Patient being prepped for G-tube placement

## 2014-04-13 ENCOUNTER — Ambulatory Visit: Payer: Medicare Other | Admitting: Radiation Oncology

## 2014-04-13 LAB — GLUCOSE, CAPILLARY
GLUCOSE-CAPILLARY: 110 mg/dL — AB (ref 70–99)
GLUCOSE-CAPILLARY: 116 mg/dL — AB (ref 70–99)
Glucose-Capillary: 108 mg/dL — ABNORMAL HIGH (ref 70–99)
Glucose-Capillary: 107 mg/dL — ABNORMAL HIGH (ref 70–99)
Glucose-Capillary: 136 mg/dL — ABNORMAL HIGH (ref 70–99)

## 2014-04-13 LAB — COMPREHENSIVE METABOLIC PANEL
ALK PHOS: 76 U/L (ref 39–117)
ALT: 17 U/L (ref 0–35)
AST: 16 U/L (ref 0–37)
Albumin: 3 g/dL — ABNORMAL LOW (ref 3.5–5.2)
Anion gap: 8 (ref 5–15)
BUN: 9 mg/dL (ref 6–23)
CO2: 27 mmol/L (ref 19–32)
CREATININE: 0.77 mg/dL (ref 0.50–1.10)
Calcium: 10.7 mg/dL — ABNORMAL HIGH (ref 8.4–10.5)
Chloride: 106 mEq/L (ref 96–112)
GFR calc non Af Amer: 88 mL/min — ABNORMAL LOW (ref >90)
Glucose, Bld: 121 mg/dL — ABNORMAL HIGH (ref 70–99)
Potassium: 4.1 mmol/L (ref 3.5–5.1)
Sodium: 141 mmol/L (ref 135–145)
TOTAL PROTEIN: 7 g/dL (ref 6.0–8.3)
Total Bilirubin: 0.5 mg/dL (ref 0.3–1.2)

## 2014-04-13 LAB — CBC
HCT: 33.8 % — ABNORMAL LOW (ref 36.0–46.0)
HEMOGLOBIN: 10.9 g/dL — AB (ref 12.0–15.0)
MCH: 27.9 pg (ref 26.0–34.0)
MCHC: 32.2 g/dL (ref 30.0–36.0)
MCV: 86.4 fL (ref 78.0–100.0)
PLATELETS: 342 10*3/uL (ref 150–400)
RBC: 3.91 MIL/uL (ref 3.87–5.11)
RDW: 15.7 % — AB (ref 11.5–15.5)
WBC: 24.5 10*3/uL — AB (ref 4.0–10.5)

## 2014-04-13 LAB — MAGNESIUM: Magnesium: 1.6 mg/dL (ref 1.5–2.5)

## 2014-04-13 NOTE — Evaluation (Signed)
Physical Therapy Evaluation Patient Details Name: Sheila Young MRN: [id number] DOB: [birth year] Today's Date: 04/13/2014   History of Present Illness  Sheila Young is an 64 y.o. female with a new diagnosis of Squamous Cell Carcinoma of the Esophagus being admitted for initiation of treatment as patient is symptomatic for severe dysphagia; PMHx: HTN  Clinical Impression  Pt will benefit from PT  to address higher level balance issues and deconditioning; will continue to follow for D/C needs    Follow Up Recommendations Home health PT;SNF;Supervision/Assistance - 24 hour (depending safety, level of supervision at home)    Equipment Recommendations  None recommended by PT    Recommendations for Other Services       Precautions / Restrictions Precautions Precautions: Fall      Mobility  Bed Mobility Overal bed mobility: Needs Assistance Bed Mobility: Supine to Sit     Supine to sit: Min guard     General bed mobility comments: repetitious multi-modal cues for technique, pt repeatedly holds out her arms and states "I can't"  Transfers Overall transfer level: Needs assistance Equipment used: None Transfers: Sit to/from Stand Sit to Stand: Min guard;Min assist         General transfer comment: cues for hand placement and safety  Ambulation/Gait Ambulation/Gait assistance: Min assist;Min guard Ambulation Distance (Feet): 140 Feet Assistive device: None Gait Pattern/deviations: Step-through pattern;Drifts right/left;Trunk flexed;Narrow base of support     General Gait Details: cues for safety awareness, obstacles, direction  Stairs            Wheelchair Mobility    Modified Rankin (Stroke Patients Only)       Balance Overall balance assessment: Needs assistance   Sitting balance-Leahy Scale: Good     Standing balance support: During functional activity Standing balance-Leahy Scale: Good                               Pertinent  Vitals/Pain Pain Assessment: No/denies pain    Home Living Family/patient expects to be discharged to:: Private residence Living Arrangements: Alone   Type of Home: House           Additional Comments: pt is unclear about D/C plan but states she will have assist, no family present; she says she is going to stay with her niece and her niece keeps a "nursery";     Prior Function Level of Independence: Independent per pt               Hand Dominance        Extremity/Trunk Assessment   Upper Extremity Assessment: Overall WFL for tasks assessed           Lower Extremity Assessment: RLE deficits/detail         Communication      Cognition Arousal/Alertness: Awake/alert Behavior During Therapy: Flat affect Overall Cognitive Status: No family/caregiver present to determine baseline cognitive functioning Area of Impairment: Following commands;Safety/judgement;Problem solving       Following Commands: Follows one step commands with increased time Safety/Judgement: Decreased awareness of safety;Decreased awareness of deficits   Problem Solving: Slow processing;Decreased initiation;Difficulty sequencing;Requires verbal cues General Comments: pt initally unsteady with Pt adn with occasional posterior lean during gait, pt without awareness and states she can "walk fine"    General Comments      Exercises        Assessment/Plan    PT Assessment Patient needs continued PT services  PT Diagnosis Difficulty  walking   PT Problem List Decreased activity tolerance;Decreased balance;Decreased coordination  PT Treatment Interventions DME instruction;Gait training;Functional mobility training;Therapeutic activities;Patient/family education;Stair training   PT Goals (Current goals can be found in the Care Plan section) Acute Rehab PT Goals Patient Stated Goal: none stated PT Goal Formulation: With patient Time For Goal Achievement: 04/20/14 Potential to Achieve  Goals: Good    Frequency Min 2X/week   Barriers to discharge        Co-evaluation               End of Session   Activity Tolerance: Patient tolerated treatment well Patient left: in chair;with call bell/phone within reach           Time: 1421-1446 PT Time Calculation (min) (ACUTE ONLY): 25 min   Charges:   PT Evaluation $Initial PT Evaluation Tier I: 1 Procedure PT Treatments $Gait Training: 8-22 mins   PT G Codes:        Sheila Young May 03, 2014, 3:51 PM

## 2014-04-13 NOTE — Progress Notes (Signed)
Subjective: Pt doing fairly well today; has some soreness at port/G tube sites as expected  Objective: Vital signs in last 24 hours: Temp:  [99.3 F (37.4 C)-99.8 F (37.7 C)] 99.3 F (37.4 C) (01/06 1418) Pulse Rate:  [65-70] 70 (01/06 1418) Resp:  [18] 18 (01/06 1418) BP: (122-141)/(52-61) 128/52 mmHg (01/06 1418) SpO2:  [98 %-100 %] 100 % (01/06 1418) Weight:  [140 lb 4.8 oz (63.64 kg)] 140 lb 4.8 oz (63.64 kg) (01/06 0500) Last BM Date: 04/11/14  Intake/Output from previous day: 01/05 0701 - 01/06 0700 In: 0  Out: 450 [Urine:450] Intake/Output this shift:    Rt IJ PAC/G tube intact, sites clean and dry, mildly tender; abd soft,+BS  Lab Results:   Recent Labs  04/12/14 0416 04/13/14 0540  WBC 21.8* 24.5*  HGB 10.8* 10.9*  HCT 34.5* 33.8*  PLT 373 342   BMET  Recent Labs  04/12/14 0416 04/13/14 0540  NA 139 141  K 3.5 4.1  CL 105 106  CO2 26 27  GLUCOSE 108* 121*  BUN 7 9  CREATININE 0.72 0.77  CALCIUM 10.5 10.7*   PT/INR No results for input(s): LABPROT, INR in the last 72 hours. ABG No results for input(s): PHART, HCO3 in the last 72 hours.  Invalid input(s): PCO2, PO2  Studies/Results: Ir Gastrostomy Tube Mod Sed  04/12/2014   CLINICAL DATA:  64 year old female with a history of esophageal cancer. She has been referred for a percutaneous gastrostomy tube placement.  The patient has tumor at the distal aspect of her esophagus. There is literature suggesting in increased risk of tumor tract seeding with pull-through gastrostomy in this patient population.  The patient will undergo a push gastrostomy for placement of percutaneous gastrostomy.  EXAM: PERCUTANEOUS GASTROSTOMY  FLUOROSCOPY TIME:  4 min minutes, 42 seconds  MEDICATIONS AND MEDICAL HISTORY: Versed 2.0 mg, Fentanyl 50 mcg.  ANESTHESIA/SEDATION: Moderate sedation time: 24 minutes  CONTRAST:  20 cc of Omni 300 enteric  PROCEDURE: The procedure, risks, benefits, and alternatives were explained  to the patient. Questions regarding the procedure were encouraged and answered. The patient understands and consents to the procedure.  The epigastrium was prepped with Betadine in a sterile fashion, and a sterile drape was applied covering the operative field. A sterile gown and sterile gloves were used for the procedure.  A 5-French orogastric tube is placed under fluoroscopic guidance.  Lateral fluoroscopy image demonstrates the gastric bubble at the most anterior aspect of the abdominal cavity with no interposed colon. Anterior view demonstrates: Along the greater curvature of the stomach, with intact gastrocolic ligament.  The stomach was distended with gas. Under fluoroscopic guidance, 3 separate T tacks were placed along the anterior body of the stomach. This was placed in a try dense formation for the future central placement of the gastrostomy tube via a push method. Each of the 3 T tacks were confirmed in position with infusion of contrast into the stomach lumen. Images were stored and sent to PACs.  Under fluoroscopic guidance, an 18 gauge needle was utilized to puncture the anterior wall of the body of the stomach within the central aspect of the T tacks. Contrast confirmed position of the needle tip within the stomach lumen. An Amplatz wire was advanced through the needle. Serial dilation of the subcutaneous tissues was performed with [place] dilator and an 69 [place] dilator. Subsequently, an 56 French peel-away sheath was placed.  An 57 French balloon retention gastrostomy was then placed through the peel-away sheath.  Contrast confirmed position of the tip of the catheter within the stomach lumen. The balloon was inflated with 5 cc of sterile saline, and withdrawn to the anterior surface of the stomach. The retention disc was brought to the skin surface.  The patient tolerated the procedure well and remained hemodynamically stable throughout.  No complications were encountered and no significant blood  loss was encountered.  FINDINGS: The image demonstrates placement of a 52 French push gastrostomy tube into the anterior body of the stomach. Three retention sutures were placed for gastropexy.  IMPRESSION: Status post placement of a 65 French balloon retention gastrostomy via the push method.  PLAN: The gastrostomy tube should be to low wall suction for 24 hr.  The tube should not be used for medications for 24 hr until the tract matures.  The tube may be flushed with 10 cc of sterile water once per shift.  Two of the T tacks were anchored at the skin level, and should be ligated 5 to 7 days from now. The third T tack was not anchored at the skin as the suture was cut during the procedure.  Signed,  Dulcy Fanny. Earleen Newport, DO  [facility]  [facility]   Electronically Signed   By: Corrie Mckusick D.O.   On: 04/12/2014 10:47   Ir Fluoro Guide Cv Line Right  04/12/2014   CLINICAL DATA:  64 year old female with a history of esophageal carcinoma. She has been referred for port catheter placement.  EXAM: IR ULTRASOUND GUIDANCE VASC ACCESS RIGHT; IR RIGHT FLOURO GUIDE CV LINE  Date: 04/12/2014  ANESTHESIA/SEDATION: Moderate (conscious) sedation was administered during this procedure. A total of 2.0 mg Versed and 100 mg Fentanyl were administered intravenously. The patient's vital signs were monitored continuously by radiology nursing throughout the course of the procedure.  Total sedation time: 25 minutes  FLUOROSCOPY TIME:  Eighteen seconds  TECHNIQUE: The right neck and chest was prepped with chlorhexidine, and draped in the usual sterile fashion using maximum barrier technique (cap and mask, sterile gown, sterile gloves, large sterile sheet, hand hygiene and cutaneous antiseptic). Antibiotic prophylaxis was provided with 2.0g Ancef administered IV one hour prior to skin incision. Local anesthesia was attained by infiltration with 1% lidocaine without epinephrine.  Ultrasound  demonstrated patency of the right internal jugular vein, and this was documented with an image. Under real-time ultrasound guidance, this vein was accessed with a 21 gauge micropuncture needle and image documentation was performed. A small dermatotomy was made at the access site with an 11 scalpel. A 0.018" wire was advanced into the SVC and the access needle exchanged for a 10F micropuncture vascular sheath. The 0.018" wire was then removed and a 0.035" wire advanced into the IVC.  An appropriate location for the subcutaneous reservoir was selected below the clavicle and an incision was made through the skin and underlying soft tissues. The subcutaneous tissues were then dissected using a combination of blunt and sharp surgical technique and a pocket was formed. A single lumen power injectable portacatheter was then tunneled through the subcutaneous tissues from the pocket to the dermatotomy and the port reservoir placed within the subcutaneous pocket.  The venous access site was then serially dilated and a peel away vascular sheath placed over the wire. The wire was removed and the port catheter advanced into position under fluoroscopic guidance. The catheter tip is positioned in the upper right atrium. This was documented with a spot image. The portacatheter was then tested and  found to flush and aspirate well. The port was flushed with saline followed by 100 units/mL heparinized saline.  The pocket was then closed in two layers using first subdermal inverted interrupted absorbable sutures followed by a running subcuticular suture. The epidermis was then sealed with Dermabond. The dermatotomy at the venous access site was also sealed with Dermabond.  COMPLICATIONS: None.  The patient tolerated the procedure well.  IMPRESSION: Status post placement of single-lumen port catheter on the right chest wall via the right IJ vein. Catheter ready for use.  Signed,  Dulcy Fanny. Earleen Newport, DO  [facility]  [facility]  [facility]   Electronically Signed   By: Corrie Mckusick D.O.   On: 04/12/2014 10:40   Ir US Guide Vasc Access Right  04/12/2014   CLINICAL DATA:  64 year old female with a history of esophageal carcinoma. She has been referred for port catheter placement.  EXAM: IR ULTRASOUND GUIDANCE VASC ACCESS RIGHT; IR RIGHT FLOURO GUIDE CV LINE  Date: 04/12/2014  ANESTHESIA/SEDATION: Moderate (conscious) sedation was administered during this procedure. A total of 2.0 mg Versed and 100 mg Fentanyl were administered intravenously. The patient's vital signs were monitored continuously by radiology nursing throughout the course of the procedure.  Total sedation time: 25 minutes  FLUOROSCOPY TIME:  Eighteen seconds  TECHNIQUE: The right neck and chest was prepped with chlorhexidine, and draped in the usual sterile fashion using maximum barrier technique (cap and mask, sterile gown, sterile gloves, large sterile sheet, hand hygiene and cutaneous antiseptic). Antibiotic prophylaxis was provided with 2.0g Ancef administered IV one hour prior to skin incision. Local anesthesia was attained by infiltration with 1% lidocaine without epinephrine.  Ultrasound demonstrated patency of the right internal jugular vein, and this was documented with an image. Under real-time ultrasound guidance, this vein was accessed with a 21 gauge micropuncture needle and image documentation was performed. A small dermatotomy was made at the access site with an 11 scalpel. A 0.018" wire was advanced into the SVC and the access needle exchanged for a 62F micropuncture vascular sheath. The 0.018" wire was then removed and a 0.035" wire advanced into the IVC.  An appropriate location for the subcutaneous reservoir was selected below the clavicle and an incision was made through the skin and underlying soft tissues. The subcutaneous tissues were then dissected using a combination of blunt and sharp surgical technique and a pocket was formed. A  single lumen power injectable portacatheter was then tunneled through the subcutaneous tissues from the pocket to the dermatotomy and the port reservoir placed within the subcutaneous pocket.  The venous access site was then serially dilated and a peel away vascular sheath placed over the wire. The wire was removed and the port catheter advanced into position under fluoroscopic guidance. The catheter tip is positioned in the upper right atrium. This was documented with a spot image. The portacatheter was then tested and found to flush and aspirate well. The port was flushed with saline followed by 100 units/mL heparinized saline.  The pocket was then closed in two layers using first subdermal inverted interrupted absorbable sutures followed by a running subcuticular suture. The epidermis was then sealed with Dermabond. The dermatotomy at the venous access site was also sealed with Dermabond.  COMPLICATIONS: None.  The patient tolerated the procedure well.  IMPRESSION: Status post placement of single-lumen port catheter on the right chest wall via the right IJ vein. Catheter ready for use.  Signed,  Dulcy Fanny. Earleen Newport, DO  [facility]  [facility]   Electronically Signed   By: Corrie Mckusick D.O.   On: 04/12/2014 10:40    Anti-infectives: Anti-infectives    Start     Dose/Rate Route Frequency Ordered Stop   04/12/14 0839  ceFAZolin (ANCEF) 2-3 GM-% IVPB SOLR    Comments:  Margaretmary Dys   : cabinet override      04/12/14 0839 04/12/14 0857   04/12/14 0730  ceFAZolin (ANCEF) IVPB 2 g/50 mL premix     2 g100 mL/hr over 30 Minutes Intravenous On call 04/11/14 1551 04/12/14 4166      Assessment/Plan: S/p rt IJ PAC/G tube placement 1/5; stable at present ;ok to use tube for feeds,etc; WBC sl more elevated today, temp 99 ; if trend cont upward , may need to panculture  LOS: 6 days    Sanjit Mcmichael,D [facility] 04/13/2014

## 2014-04-13 NOTE — Progress Notes (Addendum)
Sheila Young   DOB:10-31-1950   PN#:[id number]   MGQ#:[id number]  Patient Care Team: Beckie Salts, MD as PCP - General (Internal Medicine)  Subjective: No new events overnight. Denies any respiratory or cardiac complaints. Denies ant nausea. Tolerating tube feeding. No bleeding issues. No leg swelling Not ambulating.  Scheduled Meds: . amLODipine  10 mg Oral Daily  . atorvastatin  40 mg Oral q1800  . enoxaparin (LOVENOX) injection  40 mg Subcutaneous Q24H  . feeding supplement (PRO-STAT SUGAR FREE 64)  30 mL Per Tube q morning - 26Z  . folic acid  1 mg Intravenous Daily  . lisinopril  40 mg Oral Daily  . magnesium gluconate  500 mg Oral BID  . neomycin-bacitracin-polymyxin  1 application Topical Daily  . pantoprazole (PROTONIX) IV  40 mg Intravenous QHS  . potassium chloride  40 mEq Oral Daily  . thiamine IV  100 mg Intravenous Daily   Continuous Infusions: . dextrose 5 % and 0.9 % NaCl with KCl 40 mEq/L 50 mL/hr at 04/13/14 0539  . feeding supplement (OSMOLITE 1.5 CAL)     PRN Meds:oxyCODONE   Objective:  Filed Vitals:   04/13/14 0425  BP: 141/60  Pulse: 65  Temp: 99.8 F (37.7 C)  Resp: 18      Intake/Output Summary (Last 24 hours) at 04/13/14 0813 Last data filed at 04/12/14 1540  Gross per 24 hour  Intake      0 ml  Output    450 ml  Net   -450 ml    ECOG PERFORMANCE STATUS:2  GENERAL:alert, no distress and comfortable SKIN: skin color, texture, turgor are normal, no rashes or significant lesions EYES: normal, conjunctiva are pink and non-injected, sclera clear OROPHARYNX:no exudate, no erythema and lips, buccal mucosa, and tongue normal  NECK: supple, thyroid normal size, non-tender, without nodularity LYMPH:  no palpable lymphadenopathy in the cervical, axillary or inguinal LUNGS: clear to auscultation and percussion with normal breathing effort. Right Port non tender.  HEART: regular rate & rhythm and no murmurs and no lower extremity edema ABDOMEN:abdomen  soft, non-tender and normal bowel sounds. G tube in place Musculoskeletal:no cyanosis of digits and no clubbing  PSYCH: alert & oriented x 3 with fluent speech NEURO: no focal motor/sensory deficits    CBG (last 3)   Recent Labs  04/12/14 2006 04/12/14 2356 04/13/14 0404  GLUCAP 97 116* 108*     Labs:   Recent Labs Lab 04/07/14 1310 04/08/14 0355 04/09/14 0540 04/11/14 0710 04/12/14 0416 04/13/14 0540  WBC 20.9* 19.2* 18.2* 18.6* 21.8* 24.5*  HGB 10.8* 10.2* 10.4* 10.4* 10.8* 10.9*  HCT 32.9* 31.4* 33.0* 33.1* 34.5* 33.8*  PLT 466* 415* 428* 392 373 342  MCV 86 85.6 86.6 86.4 86.5 86.4  MCH 28.1 27.8 27.3 27.2 27.1 27.9  MCHC 32.8 32.5 31.5 31.4 31.3 32.2  RDW 14.7 14.9 15.3 15.3 15.6* 15.7*  LYMPHSABS 2.5  --   --   --   --   --   EOSABS 0.7*  --   --   --   --   --   BASOSABS 0.1  --   --   --   --   --      Chemistries:    Recent Labs Lab 04/08/14 0355 04/08/14 1145 04/08/14 1555  04/09/14 0540 04/10/14 0500 04/11/14 0413 04/11/14 0710 04/12/14 0416 04/13/14 0540  NA 140 142  --   --  142  --   --  139 139 141  K <2.0* <2.0*  --   < > 3.4* 3.2* 3.8 3.9 3.5 4.1  CL 95* 96  --   --  102  --   --  105 105 106  CO2 36* 36*  --   --  33*  --   --  25 26 27   GLUCOSE 125* 147*  --   --  120*  --   --  119* 108* 121*  BUN 9 8  --   --  <5*  --   --  <5* 7 9  CREATININE 0.68 0.65  --   --  0.66  --   --  0.61 0.72 0.77  CALCIUM 9.4 9.6  --   --  9.9  --   --  10.3 10.5 10.7*  MG  --   --  1.5  --  1.8  --   --  1.4* 1.5 1.6  AST 13  --   --   --  13  --   --  17 14 16   ALT 9  --   --   --  9  --   --  14 14 17   ALKPHOS 62  --   --   --  62  --   --  71 75 76  BILITOT 1.1  --   --   --  0.8  --   --  0.7 0.6 0.5  < > = values in this interval not displayed.  GFR Estimated Creatinine Clearance: 68.3 mL/min (by C-G formula based on Cr of 0.77).  Liver Function Tests:  Recent Labs Lab 04/08/14 0355 04/09/14 0540 04/11/14 0710 04/12/14 0416  04/13/14 0540  AST 13 13 17 14 16   ALT 9 9 14 14 17   ALKPHOS 62 62 71 75 76  BILITOT 1.1 0.8 0.7 0.6 0.5  PROT 6.9 6.7 7.1 7.1 7.0  ALBUMIN 3.3* 3.2* 3.1* 3.2* 3.0*   No results for input(s): LIPASE, AMYLASE in the last 168 hours. No results for input(s): AMMONIA in the last 168 hours.  Urine Studies  No results found for: COLORURINE, APPEARANCEUR, LABSPEC, PHURINE, GLUCOSEU, HGBUR, BILIRUBINUR, KETONESUR, PROTEINUR, UROBILINOGEN, NITRITE, LEUKOCYTESUR  Coagulation profile  Recent Labs Lab 04/08/14 0355  INR 1.16    Cardiac Enzymes: No results for input(s): CKTOTAL, CKMB, CKMBINDEX, TROPONINI in the last 168 hours. BNP: Invalid input(s): POCBNP CBG:  Recent Labs Lab 04/12/14 2006 04/12/14 2356 04/13/14 0404  GLUCAP 97 116* 108*   Iron/TIBC/Ferritin/ %Sat    Component Value Date/Time   IRON 33* 04/07/2014 1310   TIBC 235* 04/07/2014 1310   FERRITIN 345* 04/07/2014 1310   IRONPCTSAT 14* 04/07/2014 1310    Imaging Studies:   Mr Abdomen W Wo Contrast  04/11/2014   COMPARISON:  04/08/2014  FINDINGS: Despite efforts by the technologist and patient, motion artifact is present on today's exam and could not be eliminated. This reduces exam sensitivity and specificity.  Lower chest: Large circumferential mass of the distal esophagus, 5.7 by 3.5 cm on image 3 6 series 3. The mass indents the posterior margin of the left atrium. Restricted diffusion observed in the mass. Mild scarring or atelectasis in the left lower lobe.  Hepatobiliary: The tiny lesion in segment 4 has fluid signal intensity characteristics favoring small cyst. There is diffuse abnormal low signal throughout the hepatic parenchyma on essentially all sequences.  Pancreas: Unremarkable  Spleen: Diffuse abnormal low signal on all sequences throughout the parenchyma.  Adrenals/Urinary Tract: 2.7 by 3.0  cm left adrenal mass has extremely low signal/signal void on all sequences including pre and postcontrast sequences.   Stomach/Bowel: Unremarkable except in the distal esophagus as noted above.  Vascular/Lymphatic: High intravascular signal on precontrast images. I spoke with the technologist who confirmed to that the precontrast images did not include accidental early contrast injection.  Musculoskeletal: Low marrow signal on gradient images.  IMPRESSION: 1. Today' s exam demonstrates a very unusual constellation of imaging findings including very low hepatic and splenic parenchymal signal, extremely low signal of the left adrenal mass on all sequences, and low gradient signal in the marrow. Also, the precontrast sequences demonstrate high-signal in all vascular structures, very similar in appearance to as if contrast had already been administered (I confirmed with the technologist thick contrast was not administered early). This atypical and confounding appearance might be due to the paramagnetic effects of recent magnesium supplementation, combined with possible hemochromatosis. 2. Large enhancing distal esophageal tumor. The left adrenal gland does not demonstrate appreciable enhancement, although given the diffuse signal void in this vicinity, I am reluctant to rule out a confounding effect which might obscure tumor enhancement. Nuclear medicine PET-CT should be strongly considered. 3. The small segment IV lesion in the liver has a fairly convincing appearance for cyst, and is not observed to enhance.   Electronically Signed   By: Sherryl Barters M.D.   On: 04/11/2014 10:20   Ir Gastrostomy Tube Mod Sed  04/12/2014   IMPRESSION: Status post placement of a 46 French balloon retention gastrostomy via the push method.  PLAN: The gastrostomy tube should be to low wall suction for 24 hr.  The tube should not be used for medications for 24 hr until the tract matures.  The tube may be flushed with 10 cc of sterile water once per shift.  Two of the T tacks were anchored at the skin level, and should be ligated 5 to 7 days from now.  The third T tack was not anchored at the skin as the suture was cut during the procedure.  Signed,  Dulcy Fanny. Earleen Newport, DO  [facility]  [facility]   Electronically Signed   By: Corrie Mckusick D.O.   On: 04/12/2014 10:47   Ir Fluoro Guide Cv Line Right  04/12/2014    IMPRESSION: Status post placement of single-lumen port catheter on the right chest wall via the right IJ vein. Catheter ready for use.  Signed,  Dulcy Fanny. Earleen Newport, DO  [facility]  [facility]   Electronically Signed   By: Corrie Mckusick D.O.   On: 04/12/2014 10:40   Ir US Guide Vasc Access Right  04/12/2014    IMPRESSION: Status post placement of single-lumen port catheter on the right chest wall via the right IJ vein. Catheter ready for use.  Signed,  Dulcy Fanny. Earleen Newport, DO  [facility]  [facility]   Electronically Signed   By: Corrie Mckusick D.O.   On: 04/12/2014 10:40    Assessment/Plan: 64 y.o.  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 Dysphagia The patient has been recently diagnosed, she presented with dysphagia which required further workup.  EGD demonstrated an esophageal mass, for which biopsies were taken. This was consistent with squamous cell carcinoma. She is being admitted for further workup. Staging CTs did not show obvious metastatic disease, but some uncertain lesions were seen in the liver, but MRI abdomen on 1/4 was negative for liver mets.  Port-A-Cath was placed on 1/4,  Radiation Oncology consulted on 1/5, recommending simulation today, to begin radiation on the week of January 11 PET scan will be performed at an outpatient.  3. Malnutrition Secondary to #1 #2 She underwent feeding tube placement on 1/5 without complications Appreciate nutrition follow up  4.Leukocytosis Low grade fever Secondary to pain, inflammation, recent procedures, dehydration and history of  tobacco abuse.  On supportive care at this time with IV fluids, pain meds, antipyretics Tmax 99.8 May need to draw cultures if not resolved.  5. Anemia Secondary to malnutrition, malignancy No bleeding issues are noted No transfusion is indicated at this time  6. Thrombocytosis Likely reactive, in the setting of anemia and malignancy history of CVA, in the form of cerebral artery occlusion.This was on 07/28/2013 At [facility] Improved with hydration, Lovenox  7. History of Alcohol abuse On Thiamine and Folic Acid.   8. OT/PT consultation Patient needs to ambulate with assistance to improve conditioning.  8. Full code   Other medical issues as per admitting team     **Disclaimer: This note was dictated with voice recognition software. Similar sounding words can inadvertently be transcribed and this note may contain transcription errors which may not have been corrected upon publication of note.** WERTMAN,SARA E, PA-C 04/13/2014  8:13 AM   ADDENDUM:  i SAW AND EXAMINED THE PATIENT.  I AGREE WITH THE ABOVE.  NEED TO USE FEEDING TUBE.  WILL GET DIETICIAN TO HELP.  NEED TO START CHEMOTHERAPY.  PETE

## 2014-04-13 NOTE — Progress Notes (Signed)
Nutrition Brief Note  RN called to confirm that TF could be started. Orders in place. RN to start TF at rate ordered by RD on 04/12/13.   Laurette Schimke MS, RD, LDN

## 2014-04-14 ENCOUNTER — Ambulatory Visit
Admit: 2014-04-14 | Discharge: 2014-04-14 | Disposition: A | Discharge status: Home / Self Care | Payer: Medicare Other | Source: Ambulatory Visit | Attending: Radiation Oncology | Admitting: Radiation Oncology

## 2014-04-14 ENCOUNTER — Encounter: Payer: Self-pay | Admitting: *Deleted

## 2014-04-14 DIAGNOSIS — C159 Malignant neoplasm of esophagus, unspecified: Secondary | ICD-10-CM | POA: Insufficient documentation

## 2014-04-14 DIAGNOSIS — E876 Hypokalemia: Secondary | ICD-10-CM

## 2014-04-14 LAB — GLUCOSE, CAPILLARY
GLUCOSE-CAPILLARY: 109 mg/dL — AB (ref 70–99)
GLUCOSE-CAPILLARY: 126 mg/dL — AB (ref 70–99)
Glucose-Capillary: 107 mg/dL — ABNORMAL HIGH (ref 70–99)
Glucose-Capillary: 111 mg/dL — ABNORMAL HIGH (ref 70–99)
Glucose-Capillary: 114 mg/dL — ABNORMAL HIGH (ref 70–99)
Glucose-Capillary: 130 mg/dL — ABNORMAL HIGH (ref 70–99)

## 2014-04-14 LAB — CBC
HEMATOCRIT: 31.1 % — AB (ref 36.0–46.0)
HEMOGLOBIN: 9.8 g/dL — AB (ref 12.0–15.0)
MCH: 27.7 pg (ref 26.0–34.0)
MCHC: 31.5 g/dL (ref 30.0–36.0)
MCV: 87.9 fL (ref 78.0–100.0)
Platelets: 334 10*3/uL (ref 150–400)
RBC: 3.54 MIL/uL — ABNORMAL LOW (ref 3.87–5.11)
RDW: 16.2 % — ABNORMAL HIGH (ref 11.5–15.5)
WBC: 20.1 10*3/uL — ABNORMAL HIGH (ref 4.0–10.5)

## 2014-04-14 LAB — COMPREHENSIVE METABOLIC PANEL
ALT: 15 U/L (ref 0–35)
AST: 16 U/L (ref 0–37)
Albumin: 2.6 g/dL — ABNORMAL LOW (ref 3.5–5.2)
Alkaline Phosphatase: 77 U/L (ref 39–117)
Anion gap: 7 (ref 5–15)
BILIRUBIN TOTAL: 0.4 mg/dL (ref 0.3–1.2)
BUN: 14 mg/dL (ref 6–23)
CO2: 26 mmol/L (ref 19–32)
CREATININE: 0.77 mg/dL (ref 0.50–1.10)
Calcium: 10.3 mg/dL (ref 8.4–10.5)
Chloride: 106 mEq/L (ref 96–112)
GFR calc Af Amer: >90 mL/min (ref >90)
GFR calc non Af Amer: 88 mL/min — ABNORMAL LOW (ref >90)
Glucose, Bld: 113 mg/dL — ABNORMAL HIGH (ref 70–99)
Potassium: 4.5 mmol/L (ref 3.5–5.1)
Sodium: 139 mmol/L (ref 135–145)
Total Protein: 6.7 g/dL (ref 6.0–8.3)

## 2014-04-14 LAB — MAGNESIUM: Magnesium: 1.6 mg/dL (ref 1.5–2.5)

## 2014-04-14 MED ORDER — FOLIC ACID 1 MG PO TABS
1.0000 mg | ORAL_TABLET | Freq: Every day | ORAL | Status: DC
  Administered 2014-04-14 – 2014-04-19 (×6): 1 mg via ORAL
  Filled 2014-04-14 (×6): qty 1

## 2014-04-14 MED ORDER — PANTOPRAZOLE SODIUM 40 MG PO TBEC
40.0000 mg | DELAYED_RELEASE_TABLET | Freq: Every day | ORAL | Status: DC
  Administered 2014-04-14 – 2014-04-19 (×6): 40 mg via ORAL
  Filled 2014-04-14 (×6): qty 1

## 2014-04-14 NOTE — Progress Notes (Signed)
NUTRITION FOLLOW-UP  INTERVENTION: -Continue Osmolite 1.5 via G-tube at 50 ml/hr, increase to goal rate of 55 ml/hr.  -30 ml Prostat daily -TF regimen provides 2080 kcal (100% of needs), 98g of protein (100% of needs), and 1006 ml of free water. Pt will need an additional 894 ml of free water. -RD to continue to monitor for education needs prior to discharge  NUTRITION DIAGNOSIS: Inadequate oral intake related to dysphagia as evidenced by poor PO intake x 2 months, continues  Goal: Pt to meet >/= 90% of their estimated nutrition needs, progressing  Monitor:  TF initiation and tolerance , weight, labs, I/O's  ASSESSMENT: 64 y.o. female with a new diagnosis of Squamous Cell Carcinoma of the Esophagus being admitted for initiation of treatment as patient is symptomatic for severe dysphagia.  In review, she presented to her physician with a two-month history of progressive dysphagia,especially with solids, with subsequent 25 pound weight loss. She has been able to tolerate liquids.  1/7 -TF initiated 1/6, currently tolerating w/ no N/V/diarrhea -TF currently infusing at 50 ml/hr, almost to goal rate. -Pt is also on full liquid diet, consuming 50%. Pt reports feeling very hungry. -Per MD: Pt may go home next week, will need to transition to bolus feeds in the next 1-2 days. Education with pt and pt's family needed when that happens. RD will continue to follow.   Labs reviewed: WNL  Height: Ht Readings from Last 1 Encounters:  04/07/14 5\' 6"  (1.676 m)    Weight: Wt Readings from Last 1 Encounters:  04/14/14 138 lb 1.6 oz (62.642 kg)   BMI:  Body mass index is 22.3 kg/(m^2).  Estimated Nutritional Needs: Kcal: 1900-2100 Protein: 95-105g Fluid: 1.9L/day  Skin: intact  Diet Order: Diet full liquid  EDUCATION NEEDS:  -Will need home TF education closer to discharge   Intake/Output Summary (Last 24 hours) at 04/14/14 1008 Last data filed at 04/13/14 1500  Gross per 24  hour  Intake    120 ml  Output      0 ml  Net    120 ml    Last BM: 1/4  Labs:   Recent Labs Lab 04/12/14 0416 04/13/14 0540 04/14/14 0530  NA 139 141 139  K 3.5 4.1 4.5  CL 105 106 106  CO2 26 27 26   BUN 7 9 14   CREATININE 0.72 0.77 0.77  CALCIUM 10.5 10.7* 10.3  MG 1.5 1.6 1.6  GLUCOSE 108* 121* 113*    CBG (last 3)   Recent Labs  04/13/14 2351 04/14/14 0353 04/14/14 0737  GLUCAP 116* 111* 109*    Scheduled Meds: . amLODipine  10 mg Oral Daily  . atorvastatin  40 mg Oral q1800  . enoxaparin (LOVENOX) injection  40 mg Subcutaneous Q24H  . feeding supplement (PRO-STAT SUGAR FREE 64)  30 mL Per Tube q morning - 24Q  . folic acid  1 mg Oral Daily  . lisinopril  40 mg Oral Daily  . magnesium gluconate  500 mg Oral BID  . neomycin-bacitracin-polymyxin  1 application Topical Daily  . pantoprazole  40 mg Oral Daily    Continuous Infusions: . dextrose 5 % and 0.9 % NaCl with KCl 40 mEq/L 50 mL/hr at 04/14/14 0050  . feeding supplement (OSMOLITE 1.5 CAL) 1,000 mL (04/13/14 1809)     Clayton Bibles, MS, RD, LDN Pager: [phone] After Hours Pager: [phone]

## 2014-04-14 NOTE — Progress Notes (Signed)
  Radiation Oncology         [phone] ________________________________  Name: Sheila Young MRN: [id number]  Date: 04/14/2014  DOB: December 25, 1950  SIMULATION AND TREATMENT PLANNING NOTE    ICD-9-CM ICD-10-CM   1. Esophagus cancer 150.9 C15.9     DIAGNOSIS:  Advanced squamous cell carcinoma of the distal esophagus  NARRATIVE:  The patient was brought to the [facility].  Identity was confirmed.  All relevant records and images related to the planned course of therapy were reviewed.  The patient freely provided informed written consent to proceed with treatment after reviewing the details related to the planned course of therapy. The consent form was witnessed and verified by the simulation staff.  Then, the patient was set-up in a stable reproducible  supine position for radiation therapy.  CT images were obtained.  Surface markings were placed.  The CT images were loaded into the planning software.  Then the target and avoidance structures were contoured.  Treatment planning then occurred.  The radiation prescription was entered and confirmed.  Then, I designed and supervised the construction of a total of 0 medically necessary complex treatment devices.  I have requested : 3D Simulation  I have requested a DVH of the following structures: GTV. PTV, lungs, heart.  I have ordered:dose calc.  PLAN:  The patient will receive 50.4 Gy in 28 fractions along with radiosensitizing chemotherapy  ________________________________  Special treatment procedure note  The patient will be receiving radiosensitizing chemotherapy during the course of her radiation treatment. Given the increased potential for toxicities as well as the necessity for close monitoring of the patient and bloodwork, this constitutes a special treatment procedure -----------------------------------  Blair Promise, PhD, MD

## 2014-04-14 NOTE — Progress Notes (Signed)
Sheila Young is doing pretty well. She has her tube feeds going now. I appreciate nutritions help on this. Her tube feeds are going at 50 mL an hour. Patient is not having diarrhea. There is no nausea or vomiting.  I appreciate physical therapy helping [place] out. Hopefully, she will be able to do a little bit more.  She's had no bleeding.  She is still swallowing some liquids. I told her that this is okay to do.  She is had no problems with pain.  Her laboratory that was done today shows her potassium to be 4.5. Magnesium is 1.6. Her white cell count is 20.1. This is elevated which is a leukemoid reaction secondary to her malignancy. Hemoglobin is stable at 9.8.  I will probably get her treatment started tomorrow. I will use carboplatinum/Taxol. I think this would be very reasonable. I think she could tolerate this. I'm not sure when radiation therapy will start. I will like to hope that that will start soon.  On her physical exam, her vital signs show temperature 98.9. Pulse 70. Blood pressure 153/60. Head and neck exam shows no ocular or oral lesions. She has no palpable cervical or supraclavicular lymph nodes. Lungs are clear. Cardiac exam regular rate and rhythm with no murmurs, rubs or bruits. Abdomen is soft. She has good now sounds. There is no fluid wave. Her PEG tube site is intact. Liver and spleen are not palpable. Extremities shows no clubbing, cyanosis or edema. Skin exam no rashes.  Again, we will start treatment on her tomorrow.  I would suspect that she should be able to go home next week. We need to transition her tube feeds over to bolus. I want to keep her on continuous tube feeds for another day or so and then make the transition to bolus. She will need a lot help with this. I think her family is going to have to be involved with the teaching.  As always, I'm very appreciative of the great care that she is getting from the staff on [place] 1:33

## 2014-04-14 NOTE — Progress Notes (Signed)
Spoke with nurse Maudie Mercury, to verify pt was no longer NPO.  Pt is currently on a full liquid diet.  Informed Dr. Sondra Come, which he okay the CT with oral contrast.

## 2014-04-14 NOTE — Progress Notes (Signed)
Physical Therapy Treatment Patient Details Name: Sheila Young MRN: [id number] DOB: 1950-08-23 Today's Date: 04/14/2014    History of Present Illness Sheila Young is an 64 y.o. female with a new diagnosis of Squamous Cell Carcinoma of the Esophagus being admitted for initiation of treatment as patient is symptomatic for severe dysphagia; PMHx: HTN    PT Comments    Progressing with mobility. Pt continues to have intermittent near LOB during ambulation. Pt will likely need some sort of supervision if she is to d/c home safely. Could benefit from [facility] as well. If no supervision is available,may need to consider SNF placement.   Follow Up Recommendations  Home health PT;Supervision - Intermittent; Home Health Aide     Equipment Recommendations  None recommended by PT    Recommendations for Other Services       Precautions / Restrictions Precautions Precautions: Fall Restrictions Weight Bearing Restrictions: No    Mobility  Bed Mobility Overal bed mobility: Needs Assistance Bed Mobility: Supine to Sit;Sit to Supine     Supine to sit: Supervision Sit to supine: Supervision   General bed mobility comments: supervision for lines  Transfers Overall transfer level: Needs assistance   Transfers: Sit to/from Stand Sit to Stand: Min guard         General transfer comment: close guard for safety  Ambulation/Gait Ambulation/Gait assistance: Min assist;Min guard Ambulation Distance (Feet): 500 Feet Assistive device: None Gait Pattern/deviations: Decreased stride length;Step-through pattern     General Gait Details: slow gait speed. Increased posterior lean/posterior sway intermittently during ambulation. Min assist to stabilize at those times. Otherwise, pt was Min guard assist.    Stairs            Wheelchair Mobility    Modified Rankin (Stroke Patients Only)       Balance                                    Cognition  Arousal/Alertness: Awake/alert Behavior During Therapy: Flat affect Overall Cognitive Status: No family/caregiver present to determine baseline cognitive functioning         Following Commands: Follows one step commands with increased time     Problem Solving: Slow processing General Comments: pt initally unsteady with Pt adn with occasional posterior lean during gait, pt without awareness and states she can "walk fine"    Exercises General Exercises - Lower Extremity Hip Flexion/Marching: AROM;Both;10 reps;Standing Mini-Sqauts: AROM;Both;10 reps;Standing Other Exercises Other Exercises: Standing knee flexion, 10 reps, both LEs, holding on with 1 UE support    General Comments        Pertinent Vitals/Pain Pain Assessment: No/denies pain    Home Living                      Prior Function            PT Goals (current goals can now be found in the care plan section) Progress towards PT goals: Progressing toward goals    Frequency  Min 2X/week    PT Plan Current plan remains appropriate    Co-evaluation             End of Session   Activity Tolerance: Patient tolerated treatment well Patient left: in bed;with call bell/phone within reach     Time: 1457-1509 PT Time Calculation (min) (ACUTE ONLY): 12 min  Charges:  $Gait Training: 8-22 mins  G Codes:      Weston Anna, MPT Pager: [phone]

## 2014-04-14 NOTE — Care Management Note (Signed)
CARE MANAGEMENT NOTE 04/14/2014  Patient:  Sheila Young, Sheila Young   Account Number:  [id number]  Date Initiated:  04/12/2014  Documentation initiated by:  Marney Doctor  Subjective/Objective Assessment:   64 yo admitted with Esophageal CA     Action/Plan:   From home alone   Anticipated DC Date:  04/18/2014   Anticipated DC Plan:  [place]  CM consult      Choice offered to / List presented to:  C-1 Patient           Status of service:  In process, will continue to follow Medicare Important Message given?   (If response is "NO", the following Medicare IM given date fields will be blank) Date Medicare IM given:   Medicare IM given by:   Date Additional Medicare IM given:   Additional Medicare IM given by:    Discharge Disposition:    Per UR Regulation:  Reviewed for med. necessity/level of care/duration of stay  If discussed at [place] of Stay Meetings, dates discussed:   04/12/2014  04/14/2014    Comments:  04/14/14 Marney Doctor RN,BNS,NCM Met with pt for DC planning.  Pt is planning on living with her neice here in [place].  Pt does want [facility] services and list of providers left with her to discuss with her neice. Will check back for choice.  CM will continue to follow.  04/12/14 Marney Doctor RN,BSN,NCM [phone] CHart reviewed and CM following for DC needs.  PT evaluation has been ordered.  Will await PT recommendations and assist with DC planning as needed.

## 2014-04-15 DIAGNOSIS — D693 Immune thrombocytopenic purpura: Secondary | ICD-10-CM

## 2014-04-15 DIAGNOSIS — R1310 Dysphagia, unspecified: Secondary | ICD-10-CM

## 2014-04-15 DIAGNOSIS — Z87891 Personal history of nicotine dependence: Secondary | ICD-10-CM

## 2014-04-15 DIAGNOSIS — F1010 Alcohol abuse, uncomplicated: Secondary | ICD-10-CM

## 2014-04-15 LAB — GLUCOSE, CAPILLARY
GLUCOSE-CAPILLARY: 112 mg/dL — AB (ref 70–99)
GLUCOSE-CAPILLARY: 194 mg/dL — AB (ref 70–99)
Glucose-Capillary: 114 mg/dL — ABNORMAL HIGH (ref 70–99)
Glucose-Capillary: 116 mg/dL — ABNORMAL HIGH (ref 70–99)
Glucose-Capillary: 156 mg/dL — ABNORMAL HIGH (ref 70–99)

## 2014-04-15 LAB — COMPREHENSIVE METABOLIC PANEL
ALT: 16 U/L (ref 0–35)
ANION GAP: 6 (ref 5–15)
AST: 18 U/L (ref 0–37)
Albumin: 2.7 g/dL — ABNORMAL LOW (ref 3.5–5.2)
Alkaline Phosphatase: 77 U/L (ref 39–117)
BILIRUBIN TOTAL: 0.4 mg/dL (ref 0.3–1.2)
BUN: 13 mg/dL (ref 6–23)
CHLORIDE: 103 meq/L (ref 96–112)
CO2: 28 mmol/L (ref 19–32)
CREATININE: 0.67 mg/dL (ref 0.50–1.10)
Calcium: 10 mg/dL (ref 8.4–10.5)
GFR calc Af Amer: >90 mL/min (ref >90)
Glucose, Bld: 132 mg/dL — ABNORMAL HIGH (ref 70–99)
POTASSIUM: 4.2 mmol/L (ref 3.5–5.1)
SODIUM: 137 mmol/L (ref 135–145)
Total Protein: 6.7 g/dL (ref 6.0–8.3)

## 2014-04-15 LAB — MAGNESIUM: MAGNESIUM: 1.6 mg/dL (ref 1.5–2.5)

## 2014-04-15 LAB — CBC
HEMATOCRIT: 30.9 % — AB (ref 36.0–46.0)
Hemoglobin: 9.9 g/dL — ABNORMAL LOW (ref 12.0–15.0)
MCH: 27.7 pg (ref 26.0–34.0)
MCHC: 32 g/dL (ref 30.0–36.0)
MCV: 86.6 fL (ref 78.0–100.0)
Platelets: 302 10*3/uL (ref 150–400)
RBC: 3.57 MIL/uL — AB (ref 3.87–5.11)
RDW: 15.9 % — AB (ref 11.5–15.5)
WBC: 18.6 10*3/uL — AB (ref 4.0–10.5)

## 2014-04-15 MED ORDER — SODIUM CHLORIDE 0.9 % IV SOLN: Freq: Once | INTRAVENOUS | Status: AC | PRN

## 2014-04-15 MED ORDER — SODIUM CHLORIDE 0.9 % IV SOLN
200.0000 mg | Freq: Once | INTRAVENOUS | Status: AC
  Administered 2014-04-15: 200 mg via INTRAVENOUS
  Filled 2014-04-15: qty 20

## 2014-04-15 MED ORDER — FAMOTIDINE IN NACL 20-0.9 MG/50ML-% IV SOLN
20.0000 mg | Freq: Once | INTRAVENOUS | Status: AC | PRN
  Filled 2014-04-15: qty 50

## 2014-04-15 MED ORDER — DIPHENHYDRAMINE HCL 50 MG/ML IJ SOLN
50.0000 mg | Freq: Once | INTRAMUSCULAR | Status: AC
  Administered 2014-04-15: 50 mg via INTRAVENOUS
  Filled 2014-04-15: qty 1

## 2014-04-15 MED ORDER — LORAZEPAM 0.5 MG PO TABS: 0.5000 mg | ORAL_TABLET | Freq: Four times a day (QID) | ORAL | Status: DC | PRN

## 2014-04-15 MED ORDER — EPINEPHRINE HCL 0.1 MG/ML IJ SOSY: 0.2500 mg | PREFILLED_SYRINGE | Freq: Once | INTRAMUSCULAR | Status: AC | PRN

## 2014-04-15 MED ORDER — HEPARIN SOD (PORK) LOCK FLUSH 100 UNIT/ML IV SOLN: 250.0000 [IU] | Freq: Once | INTRAVENOUS | Status: AC | PRN

## 2014-04-15 MED ORDER — OSMOLITE 1.5 CAL PO LIQD
120.0000 mL | Freq: Two times a day (BID) | ORAL | Status: DC
  Administered 2014-04-15: 120 mL
  Administered 2014-04-16: 237 mL
  Administered 2014-04-16: 360 mL
  Administered 2014-04-17: 420 mL
  Administered 2014-04-17: 480 mL
  Administered 2014-04-18: 237 mL
  Administered 2014-04-18: 474 mL
  Administered 2014-04-19: 237 mL
  Filled 2014-04-15 (×9): qty 711

## 2014-04-15 MED ORDER — PROCHLORPERAZINE MALEATE 10 MG PO TABS: 10.0000 mg | ORAL_TABLET | Freq: Four times a day (QID) | ORAL | Status: DC | PRN

## 2014-04-15 MED ORDER — ONDANSETRON HCL 8 MG PO TABS
8.0000 mg | ORAL_TABLET | Freq: Two times a day (BID) | ORAL | Status: DC
  Administered 2014-04-16 – 2014-04-18 (×5): 8 mg via ORAL
  Filled 2014-04-15 (×7): qty 1

## 2014-04-15 MED ORDER — SODIUM CHLORIDE 0.9 % IJ SOLN: 3.0000 mL | INTRAMUSCULAR | Status: DC | PRN

## 2014-04-15 MED ORDER — ALTEPLASE 2 MG IJ SOLR: 2.0000 mg | Freq: Once | INTRAMUSCULAR | Status: AC | PRN

## 2014-04-15 MED ORDER — PACLITAXEL CHEMO INJECTION 300 MG/50ML
80.0000 mg/m2 | Freq: Once | INTRAVENOUS | Status: AC
  Administered 2014-04-15: 138 mg via INTRAVENOUS
  Filled 2014-04-15: qty 23

## 2014-04-15 MED ORDER — BOOST / RESOURCE BREEZE PO LIQD
1.0000 | Freq: Two times a day (BID) | ORAL | Status: DC
  Administered 2014-04-16 – 2014-04-17 (×3): 1 via ORAL
  Administered 2014-04-17: 08:00:00 via ORAL
  Administered 2014-04-18 – 2014-04-19 (×3): 1 via ORAL

## 2014-04-15 MED ORDER — ALBUTEROL SULFATE (2.5 MG/3ML) 0.083% IN NEBU: 2.5000 mg | INHALATION_SOLUTION | Freq: Once | RESPIRATORY_TRACT | Status: AC | PRN

## 2014-04-15 MED ORDER — BOOST / RESOURCE BREEZE PO LIQD: 1.0000 | Freq: Three times a day (TID) | ORAL | Status: DC

## 2014-04-15 MED ORDER — DEXAMETHASONE 4 MG PO TABS: 8.0000 mg | ORAL_TABLET | Freq: Two times a day (BID) | ORAL | Status: DC

## 2014-04-15 MED ORDER — FAMOTIDINE IN NACL 20-0.9 MG/50ML-% IV SOLN
20.0000 mg | Freq: Once | INTRAVENOUS | Status: AC
  Administered 2014-04-15: 20 mg via INTRAVENOUS
  Filled 2014-04-15: qty 50

## 2014-04-15 MED ORDER — ONDANSETRON HCL 8 MG PO TABS: 8.0000 mg | ORAL_TABLET | Freq: Two times a day (BID) | ORAL | Status: DC

## 2014-04-15 MED ORDER — SODIUM CHLORIDE 0.9 % IV SOLN
Freq: Once | INTRAVENOUS | Status: AC
  Administered 2014-04-15: 16 mg via INTRAVENOUS
  Filled 2014-04-15 (×2): qty 8

## 2014-04-15 MED ORDER — METHYLPREDNISOLONE SODIUM SUCC 125 MG IJ SOLR: 125.0000 mg | Freq: Once | INTRAMUSCULAR | Status: AC | PRN

## 2014-04-15 MED ORDER — COLD PACK MISC ONCOLOGY
1.0000 | Freq: Once | Status: AC | PRN
  Filled 2014-04-15: qty 1

## 2014-04-15 MED ORDER — DIPHENHYDRAMINE HCL 50 MG/ML IJ SOLN: 25.0000 mg | Freq: Once | INTRAMUSCULAR | Status: AC | PRN

## 2014-04-15 MED ORDER — SODIUM CHLORIDE 0.9 % IV SOLN
Freq: Once | INTRAVENOUS | Status: AC
  Administered 2014-04-15: 10:00:00 via INTRAVENOUS

## 2014-04-15 MED ORDER — SODIUM CHLORIDE 0.9 % IJ SOLN: 10.0000 mL | INTRAMUSCULAR | Status: DC | PRN

## 2014-04-15 MED ORDER — DEXAMETHASONE 4 MG PO TABS
8.0000 mg | ORAL_TABLET | Freq: Two times a day (BID) | ORAL | Status: DC
  Administered 2014-04-16 – 2014-04-17 (×4): 8 mg via ORAL
  Filled 2014-04-15 (×6): qty 2

## 2014-04-15 MED ORDER — DIPHENHYDRAMINE HCL 50 MG/ML IJ SOLN: 50.0000 mg | Freq: Once | INTRAMUSCULAR | Status: AC | PRN

## 2014-04-15 MED ORDER — EPINEPHRINE HCL 1 MG/ML IJ SOLN: 0.5000 mg | Freq: Once | INTRAMUSCULAR | Status: AC | PRN

## 2014-04-15 NOTE — Progress Notes (Signed)
NUTRITION FOLLOW-UP  INTERVENTION: Transition to Bolus Feeds: -Initiate bolus feeds with 1/2 can (135m) at first feeding time (2000), next feeding increase bolus to 1 can (237 ml) at 0800. Advance by 1/2 can (1235m until goal rate of 480 ml (2 cans) twice per day is reached. -30 ml Prostat daily -TF regimen provides 1520 kcal (80% of needs), 75g of protein (79% of needs), and 724 ml of free water. Pt will need an additional 1176 ml of H2O to be met with flushes and liquid PO intake. -Provide Resource Breeze po TID, each supplement provides 250 kcal and 9 grams of protein -RD to continue to monitor for education needs prior to discharge  Details for Home Feedings: Feedings for G-tube: TF: Osmolite 1.5, 480 ml (2 cans) 2 times daily (480 ml at 8am, 8pm) Prostat 30 ml daily Free water: 30 ml before and after each bolus feeding. Patient will need an additional 1056 ml of H2O which will be met with PO intake of liquids and supplements.  NUTRITION DIAGNOSIS: Inadequate oral intake related to dysphagia as evidenced by poor PO intake x 2 months, continues  Goal: Pt to meet >/= 90% of their estimated nutrition needs, progressing  Monitor:  TF initiation and tolerance , weight, labs, I/O's  ASSESSMENT: [age].o. female with a new diagnosis of Squamous Cell Carcinoma of the Esophagus being admitted for initiation of treatment as patient is symptomatic for severe dysphagia.  In review, she presented to her physician with a two-month history of progressive dysphagia,especially with solids, with subsequent 25 pound weight loss. She has been able to tolerate liquids.  1/7 -TF initiated 1/6, currently tolerating w/ no N/V/diarrhea -TF currently infusing at 50 ml/hr, almost to goal rate. -Pt is also on full liquid diet, consuming 50%. Pt reports feeling very hungry. -Per MD: Pt may go home next week, will need to transition to bolus feeds in the next 1-2 days. Education with pt and pt's family  needed when that happens. RD will continue to follow.   1/8 -RD consulted to transition continuous TF to bolus feedings. -RD to re-order [employer] Corporationupplement to maximize PO intake of liquid diet and to meet the rest of her estimated protein and kcal needs. -RD to encourage use of supplements when discharged to meet additional needs. -RD to monitor for tolerance of transition to bolus feeds.  Labs reviewed: WNL  Height: Ht Readings from Last 1 Encounters:  04/07/14 _0  (1.676 m)    Weight: Wt Readings from Last 1 Encounters:  04/14/14 138 lb 1.6 oz (62.642 kg)   BMI:  Body mass index is 22.3 kg/(m^2).  Estimated Nutritional Needs: Kcal: 1900-2100 Protein: 95-105g Fluid: 1.9L/day  Skin: intact  Diet Order: Diet full liquid  EDUCATION NEEDS:  -Will need home TF education closer to discharge   Intake/Output Summary (Last 24 hours) at 04/15/14 1128 Last data filed at 04/14/14 2100  Gross per 24 hour  Intake      0 ml  Output    300 ml  Net   -300 ml    Last BM: 1/4  Labs:   Recent Labs Lab 04/13/14 0540 04/14/14 0530 04/15/14 0545  NA 141 139 137  K 4.1 4.5 4.2  CL 106 106 103  CO2 _1 BUN _2 CREATININE 0.77 0.77 0.67  CALCIUM 10.7* 10.3 10.0  MG 1.6 1.6 1.6  GLUCOSE 121* 113* 132*    CBG (last 3)   Recent Labs  04/14/14 2358 04/15/14 0354 04/15/14 0745  GLUCAP 114* 112* 114*    Scheduled Meds: . sodium chloride   Intravenous Once  . amLODipine  10 mg Oral Daily  . atorvastatin  40 mg Oral q1800  . CARBOplatin  200 mg Intravenous Once  . [START ON 04/16/2014] dexamethasone  8 mg Oral BID WC  . diphenhydrAMINE  50 mg Intravenous Once  . enoxaparin (LOVENOX) injection  40 mg Subcutaneous Q24H  . famotidine  20 mg Intravenous Once  . feeding supplement (PRO-STAT SUGAR FREE 64)  30 mL Per Tube q morning - 17P  . folic acid  1 mg Oral Daily  . lisinopril  40 mg Oral Daily  . magnesium gluconate  500 mg Oral BID  .  neomycin-bacitracin-polymyxin  1 application Topical Daily  . ondansetron (ZOFRAN) with dexamethasone (DECADRON) IV   Intravenous Once  . [START ON 04/16/2014] ondansetron  8 mg Oral Q12H  . PACLitaxel  80 mg/m2 (Treatment Plan Actual) Intravenous Once  . pantoprazole  40 mg Oral Daily    Continuous Infusions: . dextrose 5 % and 0.9 % NaCl with KCl 40 mEq/L 50 mL/hr at 04/14/14 2355  . feeding supplement (OSMOLITE 1.5 CAL) 1,000 mL (04/14/14 1816)     Clayton Bibles, MS, RD, LDN Pager: [phone] After Hours Pager: [phone]

## 2014-04-15 NOTE — Progress Notes (Signed)
Physical Therapy Treatment Patient Details Name: Sheila Young MRN: [id number] DOB: 03/10/1951 Today's Date: 04/15/2014    History of Present Illness Sheila Young is an 64 y.o. female with a new diagnosis of Squamous Cell Carcinoma of the Esophagus being admitted for initiation of treatment as patient is symptomatic for severe dysphagia; PMHx: HTN    PT Comments    Some improvement in stability noted this session. Overall pt was Min guard for ambulation of ~1000 feet. Pt did c/o mild lightheadedness towards end of ambulation distance-BP 135/57 once seated on EOB. Recommend daily ambulation with nursing supervision over weekend.   Follow Up Recommendations  Home health PT;Supervision - Intermittent     Equipment Recommendations  None recommended by PT    Recommendations for Other Services       Precautions / Restrictions Precautions Precautions: Fall Restrictions Weight Bearing Restrictions: No    Mobility  Bed Mobility Overal bed mobility: Needs Assistance Bed Mobility: Supine to Sit;Sit to Supine     Supine to sit: Supervision Sit to supine: Supervision   General bed mobility comments: supervision for lines  Transfers Overall transfer level: Needs assistance Equipment used: None Transfers: Sit to/from Stand Sit to Stand: Supervision            Ambulation/Gait Ambulation/Gait assistance: Min guard Ambulation Distance (Feet): 1000 Feet Assistive device: None Gait Pattern/deviations: Decreased stride length   Gait velocity interpretation: Below normal speed for age/gender General Gait Details: slow gait speed. Less instances of near LOB this session. Pt did c/o mild lightheadedness towards end of ambulation distance-BP 135/57 once seated back on EOB   Stairs            Wheelchair Mobility    Modified Rankin (Stroke Patients Only)       Balance           Standing balance support: No upper extremity supported;During functional  activity Standing balance-Leahy Scale: Good                      Cognition Arousal/Alertness: Awake/alert Behavior During Therapy: Flat affect Overall Cognitive Status: No family/caregiver present to determine baseline cognitive functioning Area of Impairment: Problem solving;Following commands       Following Commands: Follows one step commands with increased time     Problem Solving: Slow processing;Requires verbal cues      Exercises      General Comments        Pertinent Vitals/Pain Pain Assessment: No/denies pain    Home Living                      Prior Function            PT Goals (current goals can now be found in the care plan section) Progress towards PT goals: Progressing toward goals    Frequency  Min 2X/week    PT Plan Current plan remains appropriate    Co-evaluation             End of Session   Activity Tolerance: Patient tolerated treatment well Patient left: in bed;with call bell/phone within reach     Time: 9937-1696 PT Time Calculation (min) (ACUTE ONLY): 20 min  Charges:  $Gait Training: 8-22 mins                    G Codes:      Weston Anna, MPT Pager: [phone]

## 2014-04-15 NOTE — Progress Notes (Signed)
Sheila Young is doing quite well. Her tube feeds are at 50 mL an hour. She is tolerating this well. I will see about switching her over to a bolus feeding schedule.  She was stimulated by radiation oncology.  I will start her on chemotherapy now. I will give her weekly carboplatin/Taxol. I just do not think that she is a good candidate for any type of outpatient pump area and I don't think she is a good candidate for oral chemotherapy with Xeloda. As such, I believe weekly low-dose chemotherapy would be a very reasonable and effective treatment for her.  I talked her about treatment. I gave her the side effects that she may experience area and I told her that she probably will lose her hair. I spent her about nausea. Hopefully, she had she will be oh to eat a little bit better when she gets her tumor to shrink. She has a feeding tube and so this probably will be the most likely means of feeding her.  I talked to her about diarrhea. I talked her about the chemotherapy possibly causing some rashes.  She understands all of this. She agrees to proceed.  She is doing well with physical therapy. I appreciate all of their excellent care.  She still is able to take in some liquids. She is not having any problems with nausea or vomiting.  On physical exam, her temperature is 98.8. Pulse 78. Blood pressure 124/75. Head and neck exam shows no ocular or oral lesions. She has no palpable cervical or supraclavicular lymph nodes. Lungs are clear. Cardiac exam regular rate and rhythm with no murmurs, rubs or bruits. Abdomen is soft. She has good bowel sounds. Her feeding tube site is intact. There is no palpable liver or spleen tip. Extremities shows no clubbing, cyanosis or edema.  With her labs, her potassium is 4.2. Hemoglobin 9.9. 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 She has good renal function with a BUN of 13 and creatinine of 0.67. Her magnesium is 1.6.  We now on  a position that we can start treatment for her locally advanced squamous cell carcinoma the esophagus. Again, I think that weekly chemotherapy with carboplatinum/Taxol would be appropriate. She will start radiation therapy next week.  We still have to get her converted over to bolus tube feeds. We also need to start making arrangements for outpatient care and for necessary equipment and tube feeding supplies to be sent to her house. I'm not sure where she will be living.  I spent about 35 minutes with her today.

## 2014-04-15 NOTE — Progress Notes (Addendum)
Clinical Social Work Department BRIEF PSYCHOSOCIAL ASSESSMENT 04/18/2014  Patient:  Sheila Young, Sheila Young     Account Number:  [id number]     Admit date:  04/07/2014  Clinical Social Worker:  Maryln Manuel  Date/Time:  04/15/2014 04:30 PM  Referred by:  Care Management  Date Referred:  04/18/2014 Referred for  ALF Placement   Other Referral:   Per RNCM pt family has arranged pt to go to [facility] upon discharge.   Interview type:  Patient Other interview type:   and patient family    PSYCHOSOCIAL DATA Living Status:  ALONE Admitted from facility:   Level of care:   Primary support name:  [name] Battles/sisiter/[phone] Primary support relationship to patient:  SIBLING Degree of support available:   strong    CURRENT CONCERNS Current Concerns  Post-Acute Placement   Other Concerns:    SOCIAL WORK ASSESSMENT / PLAN CSW received notification from [facility] that pt family plans for pt to go to [facility] upon discharge. Per RNCM, pt niece, Sheila Young is Scientist, physiological at facility.    CSW met with pt and pt sister at bedside who confirmed plan and provided permission for CSW to speak with pt niece, Sheila Young.    CSW spoke with pt niece, Sheila Young who confirmed that [facility] is planning to accept pt as private pay. Facility will need signed FL2 by MD, TB test, and flu/pneumonia vaccine documentation. Pt niece reports that [facility] can accept pt with feeding tube and have chosen [facility] for [facility] services. [facility] confirmed that pt does not need pasarr as pt admitting to facility as private pay.    CSW completed FL2 and placed on shadow chart for MD signature. CSW spoke to RN re: speaking with MD about order for TB test and ful/pneumonia vaccine. Per RN, pt ruled out to receive pneumonia vaccine due to receiving chemotherapy and RN will f/u with pt regarding flu vaccine. Per RN, RN contacted  MD nurse at the office who confirmed that TB test could be placed.    CSW to continue to follow to assist with pt plans to d/c to [facility] when pt medically stable for discharge.   Assessment/plan status:  Psychosocial Support/Ongoing Assessment of Needs Other assessment/ plan:   discharge planning   Information/referral to community resources:   Referral to [facility]    PATIENT'S/FAMILY'S RESPONSE TO PLAN OF CARE: Pt alert and oriented x 4. Pt has supportive family who have arranged for pt to go to [facility] upon discharge. Pt ageeable to this plan.    Sheila Young, MSW, [place] Work [phone]

## 2014-04-15 NOTE — Progress Notes (Signed)
BSA, Dosages, AUC Calculations and total volume/ dilutions  calculated and verified with Althia Forts, RN.

## 2014-04-15 NOTE — Care Management Note (Signed)
CARE MANAGEMENT NOTE 04/15/2014  Patient:  Sheila Young, Sheila Young   Account Number:  [id number]  Date Initiated:  04/12/2014  Documentation initiated by:  Marney Doctor  Subjective/Objective Assessment:   64 yo admitted with Esophageal CA     Action/Plan:   From home alone   Anticipated DC Date:  04/18/2014   Anticipated DC Plan:  [place] referral  Clinical Social Worker      DC Planning Services  CM consult      Choice offered to / List presented to:  C-1 Patient           Status of service:  In process, will continue to follow Medicare Important Message given?  YES (If response is "NO", the following Medicare IM given date fields will be blank) Date Medicare IM given:  04/15/2014 Medicare IM given by:  Marney Doctor Date Additional Medicare IM given:   Additional Medicare IM given by:    Discharge Disposition:    Per UR Regulation:  Reviewed for med. necessity/level of care/duration of stay  If discussed at [place] of Stay Meetings, dates discussed:   04/12/2014  04/14/2014    Comments:  04/15/14 Marney Doctor RN,BSN,NCM Spoke with pt and neice LaKisha about DC plans.  Ellison Hughs is the administrator at [facility] in [place] and would like to take pt to live there.  They would like to use [facility] for [facility] services. Pt will be DC'd with bolus tube feeds.  [facility] rep called to give referral.  CSW informed of pts current DC plans and will follow.  CM will follow.  04/14/14 Marney Doctor RN,BNS,NCM Met with pt for DC planning.  Pt is planning on living with her neice here in [place].  Pt does want [place] services and list of providers left with her to discuss with her neice. Will check back for choice.  CM will continue to follow.  04/12/14 Marney Doctor RN,BSN,NCM [phone] CHart reviewed and CM following for DC needs.  PT evaluation has been ordered.  Will await PT recommendations and assist with DC planning as needed.

## 2014-04-16 DIAGNOSIS — E860 Dehydration: Secondary | ICD-10-CM

## 2014-04-16 DIAGNOSIS — R627 Adult failure to thrive: Secondary | ICD-10-CM

## 2014-04-16 DIAGNOSIS — C155 Malignant neoplasm of lower third of esophagus: Secondary | ICD-10-CM

## 2014-04-16 LAB — COMPREHENSIVE METABOLIC PANEL
ALT: 18 U/L (ref 0–35)
ANION GAP: 7 (ref 5–15)
AST: 19 U/L (ref 0–37)
Albumin: 2.3 g/dL — ABNORMAL LOW (ref 3.5–5.2)
Alkaline Phosphatase: 69 U/L (ref 39–117)
BILIRUBIN TOTAL: 0.2 mg/dL — AB (ref 0.3–1.2)
BUN: 14 mg/dL (ref 6–23)
CALCIUM: 8.4 mg/dL (ref 8.4–10.5)
CO2: 22 mmol/L (ref 19–32)
Chloride: 111 mEq/L (ref 96–112)
Creatinine, Ser: 0.57 mg/dL (ref 0.50–1.10)
GLUCOSE: 121 mg/dL — AB (ref 70–99)
Potassium: 3.4 mmol/L — ABNORMAL LOW (ref 3.5–5.1)
Sodium: 140 mmol/L (ref 135–145)
Total Protein: 5.9 g/dL — ABNORMAL LOW (ref 6.0–8.3)

## 2014-04-16 LAB — MAGNESIUM: MAGNESIUM: 1.4 mg/dL — AB (ref 1.5–2.5)

## 2014-04-16 LAB — CBC
HCT: 28.9 % — ABNORMAL LOW (ref 36.0–46.0)
HEMOGLOBIN: 9.4 g/dL — AB (ref 12.0–15.0)
MCH: 28.1 pg (ref 26.0–34.0)
MCHC: 32.5 g/dL (ref 30.0–36.0)
MCV: 86.3 fL (ref 78.0–100.0)
Platelets: 312 10*3/uL (ref 150–400)
RBC: 3.35 MIL/uL — ABNORMAL LOW (ref 3.87–5.11)
RDW: 15.6 % — ABNORMAL HIGH (ref 11.5–15.5)
WBC: 19.9 10*3/uL — AB (ref 4.0–10.5)

## 2014-04-16 LAB — GLUCOSE, CAPILLARY
GLUCOSE-CAPILLARY: 129 mg/dL — AB (ref 70–99)
GLUCOSE-CAPILLARY: 186 mg/dL — AB (ref 70–99)
Glucose-Capillary: 130 mg/dL — ABNORMAL HIGH (ref 70–99)
Glucose-Capillary: 132 mg/dL — ABNORMAL HIGH (ref 70–99)
Glucose-Capillary: 139 mg/dL — ABNORMAL HIGH (ref 70–99)
Glucose-Capillary: 154 mg/dL — ABNORMAL HIGH (ref 70–99)

## 2014-04-16 MED ORDER — POTASSIUM CHLORIDE 10 MEQ/50ML IV SOLN
10.0000 meq | INTRAVENOUS | Status: AC
  Administered 2014-04-16 (×2): 10 meq via INTRAVENOUS
  Filled 2014-04-16 (×2): qty 50

## 2014-04-16 NOTE — Progress Notes (Signed)
IP PROGRESS NOTE  Subjective:   Patient is doing very well without any new complaints. She is hungry and wants to eat. She is not reporting chest pain, shortness of breath or difficulty breathing. She does not report any nausea or vomiting.   Objective:  Vital signs in last 24 hours: Temp:  [97.9 F (36.6 C)-98.9 F (37.2 C)] 97.9 F (36.6 C) (01/09 0410) Pulse Rate:  [61-68] 67 (01/09 0410) Resp:  [16-18] 18 (01/09 0410) BP: (133-161)/(61-73) 161/69 mmHg (01/09 0410) SpO2:  [98 %-100 %] 100 % (01/09 0410) Weight change:  Last BM Date: 04/11/14  Intake/Output from previous day: 01/08 0701 - 01/09 0700 In: 633 [P.O.:240; IV Piggyback:393] Out: 450 [Urine:450]  Mouth: mucous membranes moist, pharynx normal without lesions Resp: clear to auscultation bilaterally Cardio: regular rate and rhythm, S1, S2 normal, no murmur, click, rub or gallop GI: soft, non-tender; bowel sounds normal; no masses,  no organomegaly Extremities: extremities normal, atraumatic, no cyanosis or edema  Portacath without erythema  Lab Results:  Recent Labs  04/15/14 0545 04/16/14 0500  WBC 18.6* 19.9*  HGB 9.9* 9.4*  HCT 30.9* 28.9*  PLT 302 312    BMET  Recent Labs  04/15/14 0545 04/16/14 0500  NA 137 140  K 4.2 3.4*  CL 103 111  CO2 28 22  GLUCOSE 132* 121*  BUN 13 14  CREATININE 0.67 0.57  CALCIUM 10.0 8.4    Studies/Results: No results found.  Medications: I have reviewed the patient's current medications.  Assessment/Plan:  64 year old woman with the following issues:  1. Squamous cell carcinoma of the esophagus: She received the first dose of weekly carboplatin and Taxol without any complications. She is scheduled to start radiation in the near future. She tolerated this therapy without any issues.  2. Failure to thrive and dehydration: Seems to be improved at this time with intravenous hydration and tube feeding.  3. Hypokalemia: We will replace her potassium  today.  4. DVT prophylaxis: Appropriate measures are applied this to this patient.  5. Disposition: Not quite ready for discharge at this time.   LOS: 9 days   [id number],DZHGD 04/16/2014, 7:57 AM

## 2014-04-17 LAB — CBC
HEMATOCRIT: 31 % — AB (ref 36.0–46.0)
HEMOGLOBIN: 10 g/dL — AB (ref 12.0–15.0)
MCH: 27.5 pg (ref 26.0–34.0)
MCHC: 32.3 g/dL (ref 30.0–36.0)
MCV: 85.4 fL (ref 78.0–100.0)
Platelets: 333 10*3/uL (ref 150–400)
RBC: 3.63 MIL/uL — AB (ref 3.87–5.11)
RDW: 15.4 % (ref 11.5–15.5)
WBC: 28.8 10*3/uL — ABNORMAL HIGH (ref 4.0–10.5)

## 2014-04-17 LAB — COMPREHENSIVE METABOLIC PANEL
ALT: 17 U/L (ref 0–35)
ANION GAP: 4 — AB (ref 5–15)
AST: 19 U/L (ref 0–37)
Albumin: 2.9 g/dL — ABNORMAL LOW (ref 3.5–5.2)
Alkaline Phosphatase: 74 U/L (ref 39–117)
BUN: 18 mg/dL (ref 6–23)
CO2: 27 mmol/L (ref 19–32)
Calcium: 10.1 mg/dL (ref 8.4–10.5)
Chloride: 105 mEq/L (ref 96–112)
Creatinine, Ser: 0.62 mg/dL (ref 0.50–1.10)
GFR calc Af Amer: >90 mL/min (ref >90)
GFR calc non Af Amer: >90 mL/min (ref >90)
Glucose, Bld: 143 mg/dL — ABNORMAL HIGH (ref 70–99)
POTASSIUM: 4.1 mmol/L (ref 3.5–5.1)
Sodium: 136 mmol/L (ref 135–145)
Total Bilirubin: 0.5 mg/dL (ref 0.3–1.2)
Total Protein: 7 g/dL (ref 6.0–8.3)

## 2014-04-17 LAB — GLUCOSE, CAPILLARY
GLUCOSE-CAPILLARY: 120 mg/dL — AB (ref 70–99)
GLUCOSE-CAPILLARY: 144 mg/dL — AB (ref 70–99)
Glucose-Capillary: 136 mg/dL — ABNORMAL HIGH (ref 70–99)
Glucose-Capillary: 187 mg/dL — ABNORMAL HIGH (ref 70–99)
Glucose-Capillary: 190 mg/dL — ABNORMAL HIGH (ref 70–99)
Glucose-Capillary: 199 mg/dL — ABNORMAL HIGH (ref 70–99)

## 2014-04-17 LAB — MAGNESIUM: MAGNESIUM: 2 mg/dL (ref 1.5–2.5)

## 2014-04-17 NOTE — Progress Notes (Signed)
IP PROGRESS NOTE  Subjective:   Patient did not have any issues overnight. She is not reporting chest pain, shortness of breath or difficulty breathing. She does not report any nausea or vomiting. She is able to tolerate full liquid diet.   Objective:  Vital signs in last 24 hours: Temp:  [97.8 F (36.6 C)-98.3 F (36.8 C)] 98.3 F (36.8 C) (01/10 0413) Pulse Rate:  [57-66] 57 (01/10 0413) Resp:  [16-18] 16 (01/10 0413) BP: (142-161)/(57-103) 157/60 mmHg (01/10 0413) SpO2:  [99 %-100 %] 100 % (01/10 0413) Weight:  [140 lb (63.504 kg)] 140 lb (63.504 kg) (01/10 0413) Weight change:  Last BM Date: 04/16/14  Intake/Output from previous day: 01/09 0701 - 01/10 0700 In: 830 [P.O.:480] Out: -   Mouth: mucous membranes moist, pharynx normal without lesions Resp: clear to auscultation bilaterally Cardio: regular rate and rhythm, S1, S2 normal, no murmur, click, rub or gallop GI: soft, non-tender; bowel sounds normal; no masses,  no organomegaly Extremities: extremities normal, atraumatic, no cyanosis or edema  Portacath without erythema  Lab Results:  Recent Labs  04/16/14 0500 04/17/14 0415  WBC 19.9* 28.8*  HGB 9.4* 10.0*  HCT 28.9* 31.0*  PLT 312 333    BMET  Recent Labs  04/16/14 0500 04/17/14 0415  NA 140 136  K 3.4* 4.1  CL 111 105  CO2 22 27  GLUCOSE 121* 143*  BUN 14 18  CREATININE 0.57 0.62  CALCIUM 8.4 10.1     Medications: I have reviewed the patient's current medications.  Assessment/Plan:  64 year old woman with the following issues:  1. Squamous cell carcinoma of the esophagus: She received the first dose of weekly carboplatin and Taxol without any complications. She is scheduled to start radiation in the near future. No complications from treatment so far.  2. Failure to thrive and dehydration: Seems to be improved at this time with intravenous hydration and tube feeding.  3. Hypokalemia: Potassium replaced without incident.  4. DVT  prophylaxis: Appropriate measures are applied this to this patient.  5. Nutrition: She is able to tolerate tube feeds and some by mouth intake.  6. Disposition: Not quite ready for discharge at this time.   LOS: 10 days   ZOXWRU,EAVWU 04/17/2014, 7:23 AM

## 2014-04-18 LAB — GLUCOSE, CAPILLARY
GLUCOSE-CAPILLARY: 161 mg/dL — AB (ref 70–99)
GLUCOSE-CAPILLARY: 216 mg/dL — AB (ref 70–99)
Glucose-Capillary: 223 mg/dL — ABNORMAL HIGH (ref 70–99)
Glucose-Capillary: 117 mg/dL — ABNORMAL HIGH (ref 70–99)
Glucose-Capillary: 117 mg/dL — ABNORMAL HIGH (ref 70–99)

## 2014-04-18 MED ORDER — SODIUM CHLORIDE 0.9 % IV SOLN
INTRAVENOUS | Status: DC
  Administered 2014-04-18: 1000 mL via INTRAVENOUS

## 2014-04-18 MED ORDER — TUBERCULIN PPD 5 UNIT/0.1ML ID SOLN
5.0000 [IU] | Freq: Once | INTRADERMAL | Status: DC
  Administered 2014-04-18: 5 [IU] via INTRADERMAL
  Filled 2014-04-18: qty 0.1

## 2014-04-18 NOTE — Progress Notes (Addendum)
Clinical Social Work Department CLINICAL SOCIAL WORK PLACEMENT NOTE 04/18/2014  Patient:  Sheila Young, Sheila Young  Account Number:  [id number] Admit date:  04/07/2014  Clinical Social Worker:  Maryln Manuel  Date/time:  04/15/2014 04:30 PM  Clinical Social Work is seeking post-discharge placement for this patient at the following level of care:   ASSISTED LIVING/REST HOME   (*CSW will update this form in Epic as items are completed)    N/A Patient/family provided with [place] Department of Clinical Social Work's list of facilities offering this level of care within the geographic area requested by the patient (or if unable, by the patient's family).    N/A Patient/family informed of their freedom to choose among providers that offer the needed level of care, that participate in Medicare, Medicaid or managed care program needed by the patient, have an available bed and are willing to accept the patient.   N/A Patient/family informed of [facility]' ownership interest in [facility], as well as of the fact that they are under no obligation to receive care at this facility.  PASARR submitted to EDS on n/a pt going to facility as private pay PASARR number received on   FL2 transmitted to all facilities in geographic area requested by pt/family on  04/15/2014 FL2 transmitted to all facilities within larger geographic area on   Patient informed that his/her managed care company has contracts with or will negotiate with  certain facilities, including the following:     Patient/family informed of bed offers received:   Patient chooses bed at Other- [place] Physician recommends and patient chooses bed at    Patient to be transferred to  on   [facility] Patient to be transferred to facility by pt sister and pt niece by private vehicle Patient and family notified of transfer on 04/19/2014 Name of family member notified:  Pt niece,  [name]  The following physician request were entered in Epic:   Additional Comments: Pt family have arranged for pt to dc to [facility] upon discharge. No pasarr needed as pt private pay.   Alison Murray, MSW, [place] Work [phone]

## 2014-04-18 NOTE — Progress Notes (Signed)
IP PROGRESS NOTE  Subjective:   Patient did not have any issues overnight. She is not reporting chest pain, shortness of breath or difficulty breathing. She does not report any nausea or vomiting. She is able to tolerate full liquid diet.  Scheduled Meds: . amLODipine  10 mg Oral Daily  . atorvastatin  40 mg Oral q1800  . enoxaparin (LOVENOX) injection  40 mg Subcutaneous Q24H  . feeding supplement (OSMOLITE 1.5 CAL)  120-480 mL Per Tube BID  . feeding supplement (RESOURCE BREEZE)  1 Container Oral BID WC  . folic acid  1 mg Oral Daily  . lisinopril  40 mg Oral Daily  . magnesium gluconate  500 mg Oral BID  . neomycin-bacitracin-polymyxin  1 application Topical Daily  . ondansetron  8 mg Oral Q12H  . pantoprazole  40 mg Oral Daily  . tuberculin  5 Units Intradermal Once   Continuous Infusions: . sodium chloride 1,000 mL (04/18/14 0825)   PRN Meds:.LORazepam, oxyCODONE, prochlorperazine, sodium chloride, sodium chloride  Objective:  Vital signs in last 24 hours: Temp:  [98.2 F (36.8 C)-98.3 F (36.8 C)] 98.2 F (36.8 C) (01/10 2016) Pulse Rate:  [56-71] 71 (01/11 1124) Resp:  [16-18] 18 (01/10 2016) BP: (142-157)/(55-71) 150/71 mmHg (01/11 1127) SpO2:  [98 %-100 %] 99 % (01/11 1124) Weight:  [141 lb (63.957 kg)] 141 lb (63.957 kg) (01/10 2016) Weight change: 1 lb (0.454 kg) Last BM Date: 04/17/14  Intake/Output from previous day: 01/10 0701 - 01/11 0700 In: 9764.5 [P.O.:1557; I.V.:6857.5; NG/GT:420] Out: 400 [Urine:400]  Mouth: mucous membranes moist, pharynx normal without lesions Resp: clear to auscultation bilaterally Cardio: regular rate and rhythm, S1, S2 normal, no murmur, click, rub or gallop GI: soft, non-tender; bowel sounds normal; no masses,  no organomegaly Extremities: extremities normal, atraumatic, no cyanosis or edema  Portacath without erythema  Lab Results:  Recent Labs  04/16/14 0500 04/17/14 0415  WBC 19.9* 28.8*  HGB 9.4* 10.0*  HCT  28.9* 31.0*  PLT 312 333    BMET  Recent Labs  04/16/14 0500 04/17/14 0415  NA 140 136  K 3.4* 4.1  CL 111 105  CO2 22 27  GLUCOSE 121* 143*  BUN 14 18  CREATININE 0.57 0.62  CALCIUM 8.4 10.1     Assessment/Plan:  64 year old woman with the following issues: 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 Dysphagia The patient has been recently diagnosed, she presented with dysphagia which required further workup.  EGD demonstrated an esophageal mass, for which biopsies were taken. This was consistent with squamous cell carcinoma. She is being admitted for further workup, which will include a CT of the chest, abdomen and pelvis, Possible MRI of the brain. PET scan will be performed at an outpatient. She had a Port-A-Cath, feeding tube, placement in preparation for combined planned chemotherapy and radiation She received the first dose of weekly carboplatin and Taxol without any complications. She is scheduled to start radiation in the near future. No complications from treatment so far.  3. Malnutrition Secondary to #1 #2 She had a feeding tube placed without complications She is also tolerating some oral feeding Appreciate nutrition evaluation  4.Leukocytosis Secondary to pain, inflammation, dehydration and history of tobacco abuse.  The patient is afebrile We will provide supportive care at this time with IV fluids, pain meds Tobacco cessation is recommended  5. Anemia Secondary to malnutrition, malignancy No bleeding issues are noted No transfusion is indicated at this time  6. Thrombocytosis Likely reactive,  in the setting of anemia and malignancy history of CVA, in the form of cerebral artery occlusion.This was on 07/28/2013 At [facility] Platelet count is normalized at 333,000 at this time Continue Lovenox  7. History of Alcohol abuse The patient has not had consumption for about 1 week due to these symptoms She was monitored for DT without  any complications She is on  Thiamine and Folic Acid.   8. Full code  6. Disposition: Possible discharge to skilled nursing facility once medically stable.    LOS: 11 days   [facility] E 04/18/2014, 12:31 PM   ADDENDUM:  I saw and examined the patient this morning. She is making some nice progress. Her blood counts are holding pretty steady.   She now is on tube feeds. So far, she has tolerated these pretty well.  She's doing okay with physical therapy.  I think we'll probably try to let her go home tomorrow. She'll be going to assisted living. I assume that the social worker has made arrangements for all of her tube but feeds f to be provided.  Her exam is looking really good. Lungs are clear. Cardiac exam regular rate and rhythm.  Again, we will try to get her discharged tomorrow.  Laurey Arrow

## 2014-04-18 NOTE — Progress Notes (Addendum)
Physical Therapy Treatment/DC from PT Patient Details Name: Sheila Young MRN: [id number] DOB: [birth year] Today's Date: 04/18/2014    History of Present Illness Sheila Young is an 64 y.o. female with a new diagnosis of Squamous Cell Carcinoma of the Esophagus being admitted for initiation of treatment as patient is symptomatic for severe dysphagia; PMHx: HTN    PT Comments    Pt continues to progress well with mobility and balance. Pt was supervision level assist this session-walked ~500 feet without a device with only 1 instance of unsteadiness but no overt LOB. Will discharge pt from acute level PT. Pt could benefit from follow up PT at facility to maximize independence with mobility. Recommend daily ambulation in hallway with nursing supervision during hospital stay. Will sign off.   Follow Up Recommendations  Supervision - Intermittent;Home health PT (follow up PT can take place at facility)     Equipment Recommendations  None recommended by PT    Recommendations for Other Services       Precautions / Restrictions Precautions Precautions: None Restrictions Weight Bearing Restrictions: No    Mobility  Bed Mobility Overal bed mobility: Modified Independent                Transfers Overall transfer level: Modified independent                  Ambulation/Gait Ambulation/Gait assistance: Supervision Ambulation Distance (Feet): 500 Feet Assistive device: None       General Gait Details: Good gait speed. 1 instance of unsteadiness but no LOB. Pt denied lightheadedness.    Stairs            Wheelchair Mobility    Modified Rankin (Stroke Patients Only)       Balance           Standing balance support: No upper extremity supported;During functional activity Standing balance-Leahy Scale: Good               High level balance activites: Side stepping;Backward walking;Direction changes;Turns;Sudden stops High Level Balance Comments:  Pt performed above tasks at supervision level.     Cognition Arousal/Alertness: Awake/alert Behavior During Therapy: Flat affect           Following Commands: Follows multi-step commands with increased time            Exercises General Exercises - Lower Extremity Hip Flexion/Marching: AROM;Both;Standing (had pt perform high step marching for ~15 feet in room. )    General Comments        Pertinent Vitals/Pain Pain Assessment: No/denies pain    Home Living                      Prior Function            PT Goals (current goals can now be found in the care plan section) Progress towards PT goals: Goals met/education completed, patient discharged from PT (recommend daily ambulation in hallways with nursing supervision)    Frequency       PT Plan Current plan remains appropriate    Co-evaluation             End of Session   Activity Tolerance: Patient tolerated treatment well Patient left: in bed;with call bell/phone within reach     Time: 6979-4801 PT Time Calculation (min) (ACUTE ONLY): 12 min  Charges:  $Gait Training: 8-22 mins  G Codes:      Young Sheila, MPT Pager: [phone]

## 2014-04-18 NOTE — Progress Notes (Addendum)
CSW continuing to follow for pt disposition planning.  Pt planning to discharge to [place] once medically ready.  CSW spoke with RN about TB test and per RN TB test was not ordered on Friday as CSW had requested and RN stated that she would order TB test to be placed today. CSW noted TB test placed today at 11:29 am.   CSW spoke with pt niece, [name] who is the Scientist, physiological at [place]. CSW discussed with pt niece that TB test was unfortunately not placed on Friday and placed today. CSW inquired with pt niece that if pt medically ready prior to pt TB test being read if pt could discharge to [facility] and have [facility] RN read results of TB test. Per pt niece, [name], home health nurse can read results of TB test if pt is medically ready for discharge prior to time to read TB test.   RNCM notified and RNCM following for home health needs at [place].   CSW placed FL2 on shadow chart which needs MD signature.   CSW to continue to follow to assist with pt discharge needs to [facility] when pt medically stable for discharge.  Alison Murray, MSW, [place] Work [phone]

## 2014-04-19 ENCOUNTER — Ambulatory Visit
Admit: 2014-04-19 | Discharge: 2014-04-19 | Disposition: A | Discharge status: Home / Self Care | Payer: Medicare Other | Attending: Radiation Oncology | Admitting: Radiation Oncology

## 2014-04-19 ENCOUNTER — Other Ambulatory Visit: Payer: Self-pay | Admitting: Hematology & Oncology

## 2014-04-19 DIAGNOSIS — C159 Malignant neoplasm of esophagus, unspecified: Secondary | ICD-10-CM

## 2014-04-19 LAB — MAGNESIUM: Magnesium: 1.5 mg/dL (ref 1.5–2.5)

## 2014-04-19 LAB — COMPREHENSIVE METABOLIC PANEL
ALBUMIN: 2.8 g/dL — AB (ref 3.5–5.2)
ALK PHOS: 66 U/L (ref 39–117)
ALT: 20 U/L (ref 0–35)
ANION GAP: 9 (ref 5–15)
AST: 20 U/L (ref 0–37)
BILIRUBIN TOTAL: 0.7 mg/dL (ref 0.3–1.2)
BUN: 15 mg/dL (ref 6–23)
CALCIUM: 9.8 mg/dL (ref 8.4–10.5)
CO2: 28 mmol/L (ref 19–32)
Chloride: 100 mEq/L (ref 96–112)
Creatinine, Ser: 0.63 mg/dL (ref 0.50–1.10)
GFR calc Af Amer: >90 mL/min (ref >90)
Glucose, Bld: 128 mg/dL — ABNORMAL HIGH (ref 70–99)
POTASSIUM: 3.2 mmol/L — AB (ref 3.5–5.1)
SODIUM: 137 mmol/L (ref 135–145)
TOTAL PROTEIN: 6.3 g/dL (ref 6.0–8.3)

## 2014-04-19 LAB — GLUCOSE, CAPILLARY
GLUCOSE-CAPILLARY: 115 mg/dL — AB (ref 70–99)
Glucose-Capillary: 139 mg/dL — ABNORMAL HIGH (ref 70–99)
Glucose-Capillary: 185 mg/dL — ABNORMAL HIGH (ref 70–99)

## 2014-04-19 LAB — CBC
HEMATOCRIT: 32.4 % — AB (ref 36.0–46.0)
Hemoglobin: 10.5 g/dL — ABNORMAL LOW (ref 12.0–15.0)
MCH: 27.8 pg (ref 26.0–34.0)
MCHC: 32.4 g/dL (ref 30.0–36.0)
MCV: 85.7 fL (ref 78.0–100.0)
PLATELETS: 313 10*3/uL (ref 150–400)
RBC: 3.78 MIL/uL — ABNORMAL LOW (ref 3.87–5.11)
RDW: 15.8 % — AB (ref 11.5–15.5)
WBC: 25.8 10*3/uL — ABNORMAL HIGH (ref 4.0–10.5)

## 2014-04-19 MED ORDER — PANTOPRAZOLE SODIUM 40 MG PO TBEC: 40.0000 mg | DELAYED_RELEASE_TABLET | Freq: Every day | ORAL | Status: DC

## 2014-04-19 MED ORDER — LIDOCAINE-PRILOCAINE 2.5-2.5 % EX CREA: TOPICAL_CREAM | Freq: Once | CUTANEOUS | Status: DC

## 2014-04-19 MED ORDER — INFLUENZA VAC SPLIT QUAD 0.5 ML IM SUSY
0.5000 mL | PREFILLED_SYRINGE | INTRAMUSCULAR | Status: DC
  Filled 2014-04-19: qty 0.5

## 2014-04-19 MED ORDER — LORAZEPAM 0.5 MG PO TABS: 0.5000 mg | ORAL_TABLET | Freq: Four times a day (QID) | ORAL | Status: DC | PRN

## 2014-04-19 MED ORDER — LIDOCAINE-PRILOCAINE 2.5-2.5 % EX CREA
TOPICAL_CREAM | Freq: Once | CUTANEOUS | Status: AC
  Administered 2014-04-19: 10:00:00 via TOPICAL
  Filled 2014-04-19: qty 5

## 2014-04-19 MED ORDER — PROCHLORPERAZINE MALEATE 10 MG PO TABS: 10.0000 mg | ORAL_TABLET | Freq: Four times a day (QID) | ORAL | Status: DC | PRN

## 2014-04-19 MED ORDER — FOLIC ACID 1 MG PO TABS: 1.0000 mg | ORAL_TABLET | Freq: Every day | ORAL | Status: DC

## 2014-04-19 MED ORDER — HEPARIN SOD (PORK) LOCK FLUSH 100 UNIT/ML IV SOLN
500.0000 [IU] | INTRAVENOUS | Status: AC | PRN
  Administered 2014-04-19: 500 [IU]
  Filled 2014-04-19: qty 5

## 2014-04-19 MED ORDER — OXYCODONE HCL 20 MG/ML PO CONC: 5.0000 mg | ORAL | Status: DC | PRN

## 2014-04-19 MED ORDER — OSMOLITE HN PLUS PO LIQD: ORAL | Status: DC

## 2014-04-19 NOTE — Discharge Instructions (Signed)
Call if any problems with  Bleeding, vomiting, pain or fever over 101*.

## 2014-04-19 NOTE — Progress Notes (Signed)
Sheila Young has completed 1 fraction.  She denies pain.  She has a feeding tube and reports she is receiving feeding in the mornings and at night.  She is being discharged today.

## 2014-04-19 NOTE — Progress Notes (Signed)
Pt for discharge to [place] as private pay.  CSW contacted pt niece, [name] who is Scientist, physiological of [place] via telephone this morning and pt niece stated that she was coming to hospital with pt sister this afternoon and could get discharge information at that time and CSW did not need to fax discharge information in advance.   Pt family had not yet arrived to pick up pt by 4:50 pm and CSW provided discharge packet to RN to provide to pt niece, [name] upon arrival. CSW provided signed FL2, discharge summary, after visit summary, and home health orders in discharge packet for [facility]. Pt information about TB test placement placed in discharge packet and [facility] RN to read results on 04/20/14.   Per RN, pt family planned to transport via private vehicle.  No further social work needs identified at this time.  CSW signing off.   Alison Murray, MSW, [place] Work [phone]

## 2014-04-19 NOTE — Care Management Note (Signed)
CARE MANAGEMENT NOTE 04/19/2014  Patient:  Sheila Young, Sheila Young   Account Number:  [id number]  Date Initiated:  04/12/2014  Documentation initiated by:  Marney Doctor  Subjective/Objective Assessment:   64 yo admitted with Esophageal CA     Action/Plan:   From home alone   Anticipated DC Date:  04/19/2014   Anticipated DC Plan:  [place]  In-house referral  Clinical Social Worker      DC Planning Services  CM consult      Choice offered to / List presented to:  C-1 Patient   DME arranged  TUBE FEEDING      DME agency  [place] arranged  HH-1 RN  [place].   Status of service:  In process, will continue to follow Medicare Important Message given?  YES (If response is "NO", the following Medicare IM given date fields will be blank) Date Medicare IM given:  04/15/2014 Medicare IM given by:  Marney Doctor Date Additional Medicare IM given:  04/19/2014 Additional Medicare IM given by:  [facility] Deola Rewis  Discharge Disposition:    Per UR Regulation:  Reviewed for med. necessity/level of care/duration of stay  If discussed at [place] of Stay Meetings, dates discussed:   04/12/2014  04/14/2014  04/19/2014    Comments:  04/19/14 Marney Doctor RN,BSN,NCM [phone] [facility] orders written and [facility] alerted of DC today.  HHRN to read TB test on 04/20/14.  TF orders written for DC.  No other CM needs noted.  04/15/14 Marney Doctor RN,BSN,NCM Spoke with pt and neice LaKisha about DC plans.  Ellison Hughs is the administrator at [facility] in [place] and would like to take pt to live there.  They would like to use [facility] for [facility] services. Pt will be DC'd with bolus tube feeds.  [facility] rep called to give referral.  CSW informed of pts current DC plans and will follow.  CM will follow.  04/14/14 Marney Doctor RN,BNS,NCM Met with pt for DC planning.  Pt is planning on living with her neice here in [place].   Pt does want [place] services and list of providers left with her to discuss with her neice. Will check back for choice.  CM will continue to follow.  04/12/14 Marney Doctor RN,BSN,NCM [phone] CHart reviewed and CM following for DC needs.  PT evaluation has been ordered.  Will await PT recommendations and assist with DC planning as needed.

## 2014-04-19 NOTE — Progress Notes (Signed)
  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 . . . . . . . The patient presents for routine under treatment assessment.                                   The patient is without complaint.  She is happy to be discharged later this afternoon.                                 Set-up films were reviewed.                                 The chart was checked. Physical Findings. . .  The lungs are clear. The heart has a regular rhythm and rate. The abdomen is soft and nontender with normal bowel sounds. Impression . . . . . . . The patient is tolerating radiation. Plan . . . . . . . . . . . . Continue treatment as planned. Patient is being transferred to a nursing home at this time. PET scan for staging once the patient is discharged.  ________________________________   Blair Promise, PhD, MD

## 2014-04-19 NOTE — Discharge Summary (Signed)
Patient ID: Sheila Young MRN: [id number] DOB/AGE: [birth year] 64 y.o.  Admit date: 04/07/2014 Discharge date: 04/19/2014  Patient Care Team: Beckie Salts, MD as PCP - General (Internal Medicine)  Discharge Diagnoses/Hospital Course: 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 Dysphagia The patient has been recently diagnosed, she presented with dysphagia which required further workup.  EGD demonstrated an esophageal mass, for which biopsies were taken. This was consistent with squamous cell carcinoma. She is being admitted for further workup, which will include a CT of the chest, abdomen and pelvis, Possible MRI of the brain. PET scan will be performed at an outpatient. She had a Port-A-Cath, feeding tube, placement in preparation for combined planned chemotherapy and radiation She received the first dose of weekly carboplatin and Taxol without any complications. She is scheduled to start radiation in the near future. No complications from treatment so far.  3. Malnutrition Secondary to #1 #2 She had a feeding tube placed without complications She is also tolerating some oral feeding Appreciate nutrition evaluation  4.Leukocytosis Secondary to pain, inflammation, dehydration, steroids and history of tobacco abuse.  The patient is afebrile We will provide supportive care. Values trending down Tobacco cessation is recommended  5. Anemia Secondary to malnutrition, malignancy No bleeding issues are noted No transfusion is indicated at this time  6. Thrombocytosis Likely reactive, in the setting of anemia and malignancy history of CVA, in the form of cerebral artery occlusion.This was on 07/28/2013 At [facility] She received Lovenox during hospitalization Platelet count is normalized at 313,000 at this time Continue ASA  7. History of Alcohol abuse The patient has not had consumption for about 1 week due to these symptoms She was monitored for DT  without any complications She is onThiamine and Folic Acid.   8. Full code  6. Disposition: To skilled nursing facility   Past Medical History  Diagnosis Date  . Stroke   . Hypercholesteremia   . Hypertension   . Hypokalemia   . History of noncompliance with medical treatment   . High aldosterone   . Anger   . Mammogram abnormal   . Esophagus cancer 04/07/2014  . Mechanical dysphagia 04/07/2014    Discharge Medications:    Medication List    STOP taking these medications        aspirin 325 MG tablet     atorvastatin 40 MG tablet  Commonly known as:  LIPITOR     magnesium gluconate 500 MG tablet  Commonly known as:  MAGONATE     potassium chloride 40 MEQ/15ML (20%) Liqd      TAKE these medications        amLODipine 10 MG tablet  Commonly known as:  NORVASC  Take 10 mg by mouth daily.     clopidogrel 75 MG tablet  Commonly known as:  PLAVIX  Take 75 mg by mouth daily.     dexamethasone 4 MG tablet  Commonly known as:  DECADRON  Take 2 tablets (8 mg total) by mouth 2 (two) times daily with a meal. Start the day after chemotherapy for 3 days.     folic acid 1 MG tablet  Commonly known as:  FOLVITE  Take 1 tablet (1 mg total) by mouth daily.     lidocaine-prilocaine cream  Commonly known as:  EMLA  Apply topically once. Apply to port a cath site 1 hour PRIOR to port access     lisinopril 40 MG tablet  Commonly known as:  PRINIVIL,ZESTRIL  Take 40 mg by mouth daily.     LORazepam 0.5 MG tablet  Commonly known as:  ATIVAN  Take 1 tablet (0.5 mg total) by mouth every 6 (six) hours as needed (Nausea or vomiting).     LORazepam 0.5 MG tablet  Commonly known as:  ATIVAN  Take 1 tablet (0.5 mg total) by mouth every 6 (six) hours as needed (nausea,  vomiting).     ondansetron 8 MG tablet  Commonly known as:  ZOFRAN  Take 1 tablet (8 mg total) by mouth 2 (two) times daily. Start the day after chemo for 3 days. Then take as needed for nausea or  vomiting.     OSMOLITE HN PLUS Liqd  Place contents of 1 can into feeding tube 4 times a day.     oxyCODONE 20 MG/ML concentrated solution  Commonly known as:  ROXICODONE INTENSOL  Take 0.3-0.5 mLs (6-10 mg total) by mouth every 4 (four) hours as needed for severe pain.     pantoprazole 40 MG tablet  Commonly known as:  PROTONIX  Take 1 tablet (40 mg total) by mouth daily.     prochlorperazine 10 MG tablet  Commonly known as:  COMPAZINE  Take 1 tablet (10 mg total) by mouth every 6 (six) hours as needed (Nausea or vomiting).     prochlorperazine 10 MG tablet  Commonly known as:  COMPAZINE  Take 1 tablet (10 mg total) by mouth every 6 (six) hours as needed for nausea or vomiting.        Discharge Condition: Improved.  Diet recommendation:  On tube feeding    Disposition and Follow-up:      Discharge Instructions    De-access Port A Cath    Complete by:  As directed      Discharge patient    Complete by:  As directed      [place]    Complete by:  As directed   Chemotherapy Appointment - 2.5 hr     TREATMENT CONDITIONS    Complete by:  As directed   Notify the MD for the following lab values: ANC < 1500, Hemoglobin < 8, PLT < 100,000,  Creatinine > 1.5, urine output < 200 ml prior to cisplatin, Total Bili > 1.5, ALT & AST > 80. If labs are abnormal OR no lab data is available, or if patient has unstable vital signs: Temperature > 38.5, SBP > 180 or < 90, RR > 30 or HR > 100 MD must be notified and order obtained to begin chemotherapy.          Follow-up Information    Follow up with Volanda Napoleon, MD.   Specialty:  Oncology   Why:  come to my office on Friday - January 15 - for chemo.   Contact information:   [place], [place] [address] [phone]       Follow up with [facility].   Specialty:  Radiation Oncology   Why:  go daily to radiation therapy appointments   Contact information:    [place] [id number] [place] [phone]       ECOG PERFORMANCE STATUS:2   Brief History of Present Illness: For complete details please refer to admission H and P, but in brief, Sheila Young is an 64 y.o. female with a new diagnosis of Squamous Cell Carcinoma of the Esophagus being admitted for initiation of treatment as patient is symptomatic for  severe dysphagia.  In review, she presented to her physician with a two-month history of progressive dysphagia,especially with solids, with subsequent 25 pound weight loss. She has been able to tolerate liquids. She has increase saliva production. She denies any vomiting, but she does report intermittent nausea She denies any shortness of breath, cough or cardiac complaints. No bleeding issues such as hemoptysis or hematemesis. She denies any pain at this time. She denies any fever. he patient has a history of Prior CVA,Tobacco and alcohol abuse. She was seen by ENT there referred for upper endoscopy, remarkable for a GI mass seen at the esophagus. EGD on December 23 showed this mass to be 30 cm down to the EG junction. Biopsies were taken, and the Esophagus was dilated. Pathology reports, case number [id number] was consistent with squamous cell carcinoma. She then was referred to the office of Dr. Marin Olp the [facility] at [facility] for further evaluation. He recommended that the patient be admitted for appropriate staging studies, feeding tube placement and lab work. Her ECOG is 2  Physical Exam at Discharge:  Subjective:   Objective:  BP 134/59 mmHg  Pulse 77  Temp(Src) 99.8 F (37.7 C) (Oral)  Resp 17  Ht 5\' 6"  (1.676 m)  Wt 140 lb (63.504 kg)  BMI 22.61 kg/m2  SpO2 98% GENERAL:alert, no distress and comfortable SKIN: skin color, texture, turgor are normal, no rashes or significant lesions EYES: normal, conjunctiva are pink and non-injected, sclera clear OROPHARYNX:no exudate, no  erythema and lips, buccal mucosa, and tongue normal  NECK: supple, thyroid normal size, non-tender, without nodularity LYMPH:  no palpable lymphadenopathy in the cervical, axillary or inguinal LUNGS: clear to auscultation and percussion with normal breathing effort HEART: regular rate & rhythm and no murmurs and no lower extremity edema ABDOMEN:abdomen soft, non-tender and normal bowel sounds Musculoskeletal:no cyanosis of digits and no clubbing  PSYCH: alert & oriented x 3 with fluent speech NEURO: no focal motor/sensory deficits    Significant Diagnostic Studies:  Ct Chest W Contrast  04/08/2014   CLINICAL DATA:  Esophageal cancer on endoscopy yesterday. Dysphagia. Evaluate for metastasis.  EXAM: CT CHEST, ABDOMEN, AND PELVIS WITH CONTRAST  TECHNIQUE: Multidetector CT imaging of the chest, abdomen and pelvis was performed following the standard protocol during bolus administration of intravenous contrast.  CONTRAST:  162mL OMNIPAQUE IOHEXOL 300 MG/ML  SOLN  COMPARISON:  Esophagram of 03/28/2014.  FINDINGS: CT CHEST FINDINGS  Lungs/Pleura: Moderate centrilobular emphysema. Mild biapical pleural parenchymal scarring.  Dependent atelectasis or scarring at the lung bases, greater on the left. Mild interstitial thickening of the left apex, favored to represent scarring.  No pleural fluid.  Heart/Mediastinum: No supraclavicular adenopathy. Aortic and branch vessel atherosclerosis. Mild cardiomegaly. Multivessel coronary artery atherosclerosis. No central pulmonary embolism, on this non-dedicated study. No hilar adenopathy.  Lungs segment distal esophageal carcinoma with marked soft tissue thickening, including on image 44 of series 2. This is partially obstructive, as evidenced by dilatation more proximally with fluid level within.  Periesophageal adenopathy. Example 1.0 cm left-sided node on image 33 of series 2. There is likely adenopathy along the periaortic space on image 26 of series 2. 1.0 cm.  Retroesophageal node measures 1.4 cm on image 27.  CT ABDOMEN AND PELVIS FINDINGS  Hepatobiliary: Too small to characterize lesion within medial segment left lobe that 7 mm on image 67. Normal gallbladder, without biliary ductal dilatation.  Pancreas: Normal, without mass or pancreatic ductal dilatation.  Spleen: Normal  Adrenals/Urinary tract: Mild right  adrenal thickening. Left adrenal nodule is mildly irregular and measures 2.7 x 2.3 cm on image 63. Mildly malrotated left kidney. Punctate renal collecting system stones bilaterally. No hydronephrosis. Normal ureters and urinary bladder.  Stomach/Bowel: Normal stomach, without wall thickening. Pelvic floor laxity. Apparent soft tissue fullness in the anorectal region is favored to be secondary. Normal terminal ileum and appendix. Normal small bowel.  Vascular/Lymphatic: Aortic and branch vessel atherosclerosis. No abdominopelvic adenopathy. Significant atherosclerosis involving the superficial femoral arteries bilaterally. Apparent occlusion on the right, including on image 120 of series 2. Significant narrowing on the left on image 119 of series 2.  Reproductive: Retroverted uterus. Small calcifications within are likely dystrophic. Suspect small uterine fibroids, including posteriorly on image 106. No adnexal mass.  Other: No significant free fluid. No evidence of omental or peritoneal disease.  Musculoskeletal: Tiny sclerotic lesions within the pelvis are likely bone islands.  IMPRESSION: CT CHEST IMPRESSION  1. Large, partially obstructive distal esophageal primary with surrounding nodal metastasis. 2. Age advanced coronary artery atherosclerosis. Recommend assessment of coronary risk factors and consideration of medical therapy.  CT ABDOMEN AND PELVIS IMPRESSION  1. Indeterminate left hepatic lobe lesion. Slightly favored to represent a cyst, given well-circumscribed appearance on coronal reformatted images. 2. Left adrenal mass. Technically indeterminate.  Given size, metastatic disease is a concern. Consider further evaluation with PET. This would also better evaluate the liver lesion. 3. Advanced atherosclerosis with apparent exclusion of the right superficial femoral artery and significant stenosis of the left superficial femoral artery. 4. Uterine fibroids. 5. Bilateral nephrolithiasis.   Electronically Signed   By: Abigail Miyamoto M.D.   On: 04/08/2014 14:50   Mr Abdomen W Wo Contrast  04/11/2014   CLINICAL DATA:  Esophageal cancer.  Hepatic and adrenal lesions.  EXAM: MRI ABDOMEN WITHOUT AND WITH CONTRAST  TECHNIQUE: Multiplanar multisequence MR imaging of the abdomen was performed both before and after the administration of intravenous contrast.  CONTRAST:  39mL MULTIHANCE GADOBENATE DIMEGLUMINE 529 MG/ML IV SOLN  COMPARISON:  04/08/2014  FINDINGS: Despite efforts by the technologist and patient, motion artifact is present on today's exam and could not be eliminated. This reduces exam sensitivity and specificity.  Lower chest: Large circumferential mass of the distal esophagus, 5.7 by 3.5 cm on image 3 6 series 3. The mass indents the posterior margin of the left atrium. Restricted diffusion observed in the mass. Mild scarring or atelectasis in the left lower lobe.  Hepatobiliary: The tiny lesion in segment 4 has fluid signal intensity characteristics favoring small cyst. There is diffuse abnormal low signal throughout the hepatic parenchyma on essentially all sequences.  Pancreas: Unremarkable  Spleen: Diffuse abnormal low signal on all sequences throughout the parenchyma.  Adrenals/Urinary Tract: 2.7 by 3.0 cm left adrenal mass has extremely low signal/signal void on all sequences including pre and postcontrast sequences.  Stomach/Bowel: Unremarkable except in the distal esophagus as noted above.  Vascular/Lymphatic: High intravascular signal on precontrast images. I spoke with the technologist who confirmed to that the precontrast images did not include  accidental early contrast injection.  Musculoskeletal: Low marrow signal on gradient images.  IMPRESSION: 1. Today' s exam demonstrates a very unusual constellation of imaging findings including very low hepatic and splenic parenchymal signal, extremely low signal of the left adrenal mass on all sequences, and low gradient signal in the marrow. Also, the precontrast sequences demonstrate high-signal in all vascular structures, very similar in appearance to as if contrast had already been administered (I confirmed with the  technologist thick contrast was not administered early). This atypical and confounding appearance might be due to the paramagnetic effects of recent magnesium supplementation, combined with possible hemochromatosis. 2. Large enhancing distal esophageal tumor. The left adrenal gland does not demonstrate appreciable enhancement, although given the diffuse signal void in this vicinity, I am reluctant to rule out a confounding effect which might obscure tumor enhancement. Nuclear medicine PET-CT should be strongly considered. 3. The small segment IV lesion in the liver has a fairly convincing appearance for cyst, and is not observed to enhance.   Electronically Signed   By: Sherryl Barters M.D.   On: 04/11/2014 10:20   Ct Abdomen Pelvis W Contrast  04/08/2014   CLINICAL DATA:  Esophageal cancer on endoscopy yesterday. Dysphagia. Evaluate for metastasis.  EXAM: CT CHEST, ABDOMEN, AND PELVIS WITH CONTRAST  TECHNIQUE: Multidetector CT imaging of the chest, abdomen and pelvis was performed following the standard protocol during bolus administration of intravenous contrast.  CONTRAST:  158mL OMNIPAQUE IOHEXOL 300 MG/ML  SOLN  COMPARISON:  Esophagram of 03/28/2014.  FINDINGS: CT CHEST FINDINGS  Lungs/Pleura: Moderate centrilobular emphysema. Mild biapical pleural parenchymal scarring.  Dependent atelectasis or scarring at the lung bases, greater on the left. Mild interstitial thickening of the left apex,  favored to represent scarring.  No pleural fluid.  Heart/Mediastinum: No supraclavicular adenopathy. Aortic and branch vessel atherosclerosis. Mild cardiomegaly. Multivessel coronary artery atherosclerosis. No central pulmonary embolism, on this non-dedicated study. No hilar adenopathy.  Lungs segment distal esophageal carcinoma with marked soft tissue thickening, including on image 44 of series 2. This is partially obstructive, as evidenced by dilatation more proximally with fluid level within.  Periesophageal adenopathy. Example 1.0 cm left-sided node on image 33 of series 2. There is likely adenopathy along the periaortic space on image 26 of series 2. 1.0 cm. Retroesophageal node measures 1.4 cm on image 27.  CT ABDOMEN AND PELVIS FINDINGS  Hepatobiliary: Too small to characterize lesion within medial segment left lobe that 7 mm on image 67. Normal gallbladder, without biliary ductal dilatation.  Pancreas: Normal, without mass or pancreatic ductal dilatation.  Spleen: Normal  Adrenals/Urinary tract: Mild right adrenal thickening. Left adrenal nodule is mildly irregular and measures 2.7 x 2.3 cm on image 63. Mildly malrotated left kidney. Punctate renal collecting system stones bilaterally. No hydronephrosis. Normal ureters and urinary bladder.  Stomach/Bowel: Normal stomach, without wall thickening. Pelvic floor laxity. Apparent soft tissue fullness in the anorectal region is favored to be secondary. Normal terminal ileum and appendix. Normal small bowel.  Vascular/Lymphatic: Aortic and branch vessel atherosclerosis. No abdominopelvic adenopathy. Significant atherosclerosis involving the superficial femoral arteries bilaterally. Apparent occlusion on the right, including on image 120 of series 2. Significant narrowing on the left on image 119 of series 2.  Reproductive: Retroverted uterus. Small calcifications within are likely dystrophic. Suspect small uterine fibroids, including posteriorly on image 106. No  adnexal mass.  Other: No significant free fluid. No evidence of omental or peritoneal disease.  Musculoskeletal: Tiny sclerotic lesions within the pelvis are likely bone islands.  IMPRESSION: CT CHEST IMPRESSION  1. Large, partially obstructive distal esophageal primary with surrounding nodal metastasis. 2. Age advanced coronary artery atherosclerosis. Recommend assessment of coronary risk factors and consideration of medical therapy.  CT ABDOMEN AND PELVIS IMPRESSION  1. Indeterminate left hepatic lobe lesion. Slightly favored to represent a cyst, given well-circumscribed appearance on coronal reformatted images. 2. Left adrenal mass. Technically indeterminate. Given size, metastatic disease is a concern. Consider further evaluation with  PET. This would also better evaluate the liver lesion. 3. Advanced atherosclerosis with apparent exclusion of the right superficial femoral artery and significant stenosis of the left superficial femoral artery. 4. Uterine fibroids. 5. Bilateral nephrolithiasis.   Electronically Signed   By: Abigail Miyamoto M.D.   On: 04/08/2014 14:50   Ir Gastrostomy Tube Mod Sed  04/12/2014   CLINICAL DATA:  64 year old female with a history of esophageal cancer. She has been referred for a percutaneous gastrostomy tube placement.  The patient has tumor at the distal aspect of her esophagus. There is literature suggesting in increased risk of tumor tract seeding with pull-through gastrostomy in this patient population.  The patient will undergo a push gastrostomy for placement of percutaneous gastrostomy.  EXAM: PERCUTANEOUS GASTROSTOMY  FLUOROSCOPY TIME:  4 min minutes, 42 seconds  MEDICATIONS AND MEDICAL HISTORY: Versed 2.0 mg, Fentanyl 50 mcg.  ANESTHESIA/SEDATION: Moderate sedation time: 24 minutes  CONTRAST:  20 cc of Omni 300 enteric  PROCEDURE: The procedure, risks, benefits, and alternatives were explained to the patient. Questions regarding the procedure were encouraged and answered. The  patient understands and consents to the procedure.  The epigastrium was prepped with Betadine in a sterile fashion, and a sterile drape was applied covering the operative field. A sterile gown and sterile gloves were used for the procedure.  A 5-French orogastric tube is placed under fluoroscopic guidance.  Lateral fluoroscopy image demonstrates the gastric bubble at the most anterior aspect of the abdominal cavity with no interposed colon. Anterior view demonstrates: Along the greater curvature of the stomach, with intact gastrocolic ligament.  The stomach was distended with gas. Under fluoroscopic guidance, 3 separate T tacks were placed along the anterior body of the stomach. This was placed in a try dense formation for the future central placement of the gastrostomy tube via a push method. Each of the 3 T tacks were confirmed in position with infusion of contrast into the stomach lumen. Images were stored and sent to PACs.  Under fluoroscopic guidance, an 18 gauge needle was utilized to puncture the anterior wall of the body of the stomach within the central aspect of the T tacks. Contrast confirmed position of the needle tip within the stomach lumen. An Amplatz wire was advanced through the needle. Serial dilation of the subcutaneous tissues was performed with [place] dilator and an 72 [place] dilator. Subsequently, an 27 French peel-away sheath was placed.  An 58 French balloon retention gastrostomy was then placed through the peel-away sheath. Contrast confirmed position of the tip of the catheter within the stomach lumen. The balloon was inflated with 5 cc of sterile saline, and withdrawn to the anterior surface of the stomach. The retention disc was brought to the skin surface.  The patient tolerated the procedure well and remained hemodynamically stable throughout.  No complications were encountered and no significant blood loss was encountered.  FINDINGS: The image demonstrates placement of a 37 French push  gastrostomy tube into the anterior body of the stomach. Three retention sutures were placed for gastropexy.  IMPRESSION: Status post placement of a 81 French balloon retention gastrostomy via the push method.  PLAN: The gastrostomy tube should be to low wall suction for 24 hr.  The tube should not be used for medications for 24 hr until the tract matures.  The tube may be flushed with 10 cc of sterile water once per shift.  Two of the T tacks were anchored at the skin level, and should be ligated 5  to 7 days from now. The third T tack was not anchored at the skin as the suture was cut during the procedure.  Signed,  Dulcy Fanny. Earleen Newport, DO  [facility]  [facility]   Electronically Signed   By: Corrie Mckusick D.O.   On: 04/12/2014 10:47   Ir Fluoro Guide Cv Line Right  04/12/2014   CLINICAL DATA:  64 year old female with a history of esophageal carcinoma. She has been referred for port catheter placement.  EXAM: IR ULTRASOUND GUIDANCE VASC ACCESS RIGHT; IR RIGHT FLOURO GUIDE CV LINE  Date: 04/12/2014  ANESTHESIA/SEDATION: Moderate (conscious) sedation was administered during this procedure. A total of 2.0 mg Versed and 100 mg Fentanyl were administered intravenously. The patient's vital signs were monitored continuously by radiology nursing throughout the course of the procedure.  Total sedation time: 25 minutes  FLUOROSCOPY TIME:  Eighteen seconds  TECHNIQUE: The right neck and chest was prepped with chlorhexidine, and draped in the usual sterile fashion using maximum barrier technique (cap and mask, sterile gown, sterile gloves, large sterile sheet, hand hygiene and cutaneous antiseptic). Antibiotic prophylaxis was provided with 2.0g Ancef administered IV one hour prior to skin incision. Local anesthesia was attained by infiltration with 1% lidocaine without epinephrine.  Ultrasound demonstrated patency of the right internal jugular vein, and this was documented with an  image. Under real-time ultrasound guidance, this vein was accessed with a 21 gauge micropuncture needle and image documentation was performed. A small dermatotomy was made at the access site with an 11 scalpel. A 0.018" wire was advanced into the SVC and the access needle exchanged for a 28F micropuncture vascular sheath. The 0.018" wire was then removed and a 0.035" wire advanced into the IVC.  An appropriate location for the subcutaneous reservoir was selected below the clavicle and an incision was made through the skin and underlying soft tissues. The subcutaneous tissues were then dissected using a combination of blunt and sharp surgical technique and a pocket was formed. A single lumen power injectable portacatheter was then tunneled through the subcutaneous tissues from the pocket to the dermatotomy and the port reservoir placed within the subcutaneous pocket.  The venous access site was then serially dilated and a peel away vascular sheath placed over the wire. The wire was removed and the port catheter advanced into position under fluoroscopic guidance. The catheter tip is positioned in the upper right atrium. This was documented with a spot image. The portacatheter was then tested and found to flush and aspirate well. The port was flushed with saline followed by 100 units/mL heparinized saline.  The pocket was then closed in two layers using first subdermal inverted interrupted absorbable sutures followed by a running subcuticular suture. The epidermis was then sealed with Dermabond. The dermatotomy at the venous access site was also sealed with Dermabond.  COMPLICATIONS: None.  The patient tolerated the procedure well.  IMPRESSION: Status post placement of single-lumen port catheter on the right chest wall via the right IJ vein. Catheter ready for use.  Signed,  Dulcy Fanny. Earleen Newport, DO  [facility]  [facility]   Electronically Signed   By: Corrie Mckusick D.O.   On:  04/12/2014 10:40   Ir US Guide Vasc Access Right  04/12/2014   CLINICAL DATA:  64 year old female with a history of esophageal carcinoma. She has been referred for port catheter placement.  EXAM: IR ULTRASOUND GUIDANCE VASC ACCESS RIGHT; IR RIGHT FLOURO GUIDE CV LINE  Date: 04/12/2014  ANESTHESIA/SEDATION: Moderate (conscious) sedation was administered during this procedure. A total of 2.0 mg Versed and 100 mg Fentanyl were administered intravenously. The patient's vital signs were monitored continuously by radiology nursing throughout the course of the procedure.  Total sedation time: 25 minutes  FLUOROSCOPY TIME:  Eighteen seconds  TECHNIQUE: The right neck and chest was prepped with chlorhexidine, and draped in the usual sterile fashion using maximum barrier technique (cap and mask, sterile gown, sterile gloves, large sterile sheet, hand hygiene and cutaneous antiseptic). Antibiotic prophylaxis was provided with 2.0g Ancef administered IV one hour prior to skin incision. Local anesthesia was attained by infiltration with 1% lidocaine without epinephrine.  Ultrasound demonstrated patency of the right internal jugular vein, and this was documented with an image. Under real-time ultrasound guidance, this vein was accessed with a 21 gauge micropuncture needle and image documentation was performed. A small dermatotomy was made at the access site with an 11 scalpel. A 0.018" wire was advanced into the SVC and the access needle exchanged for a 18F micropuncture vascular sheath. The 0.018" wire was then removed and a 0.035" wire advanced into the IVC.  An appropriate location for the subcutaneous reservoir was selected below the clavicle and an incision was made through the skin and underlying soft tissues. The subcutaneous tissues were then dissected using a combination of blunt and sharp surgical technique and a pocket was formed. A single lumen power injectable portacatheter was then tunneled through the subcutaneous  tissues from the pocket to the dermatotomy and the port reservoir placed within the subcutaneous pocket.  The venous access site was then serially dilated and a peel away vascular sheath placed over the wire. The wire was removed and the port catheter advanced into position under fluoroscopic guidance. The catheter tip is positioned in the upper right atrium. This was documented with a spot image. The portacatheter was then tested and found to flush and aspirate well. The port was flushed with saline followed by 100 units/mL heparinized saline.  The pocket was then closed in two layers using first subdermal inverted interrupted absorbable sutures followed by a running subcuticular suture. The epidermis was then sealed with Dermabond. The dermatotomy at the venous access site was also sealed with Dermabond.  COMPLICATIONS: None.  The patient tolerated the procedure well.  IMPRESSION: Status post placement of single-lumen port catheter on the right chest wall via the right IJ vein. Catheter ready for use.  Signed,  Dulcy Fanny. Earleen Newport, DO  [facility]  [facility]   Electronically Signed   By: Corrie Mckusick D.O.   On: 04/12/2014 10:40    Discharge Laboratory Values:  CBC  Recent Labs Lab 04/14/14 0530 04/15/14 0545 04/16/14 0500 04/17/14 0415 04/19/14 0645  WBC 20.1* 18.6* 19.9* 28.8* 25.8*  HGB 9.8* 9.9* 9.4* 10.0* 10.5*  HCT 31.1* 30.9* 28.9* 31.0* 32.4*  PLT 334 302 312 333 313  MCV 87.9 86.6 86.3 85.4 85.7  MCH 27.7 27.7 28.1 27.5 27.8  MCHC 31.5 32.0 32.5 32.3 32.4  RDW 16.2* 15.9* 15.6* 15.4 15.8*    Anemia panel:  No results for input(s): VITAMINB12, FOLATE, FERRITIN, TIBC, IRON, RETICCTPCT in the last 72 hours.   Chemistries   Recent Labs Lab 04/14/14 0530 04/15/14 0545 04/16/14 0500 04/17/14 0415 04/19/14 0645  NA 139 137 140 136 137  K 4.5 4.2 3.4* 4.1 3.2*  CL 106 103 111 105 100  CO2 26 28 22 27 28   GLUCOSE 113*  132* 121*  143* 128*  BUN 14 13 14 18 15   CREATININE 0.77 0.67 0.57 0.62 0.63  CALCIUM 10.3 10.0 8.4 10.1 9.8  MG 1.6 1.6 1.4* 2.0 1.5     Coagulation profile No results for input(s): INR, PROTIME in the last 168 hours.  Urine Studies No results for input(s): UHGB, CRYS in the last 72 hours.  Invalid input(s): UACOL, UAPR, USPG, UPH, UTP, UGL, UKET, UBIL, UNIT, UROB, ULEU, UEPI, UWBC, URBC, UBAC, CAST, UCOM, BILUA   Signed: Sharene Butters, PA-C 04/19/2014, 11:18 AM   ADDENDUM: I saw and examined Sheila Young today. She will be discharged to home. We will we will plan to get her back to do treatment in 2 or 3 days.  I agree with the above.

## 2014-04-20 ENCOUNTER — Ambulatory Visit
Admission: RE | Admit: 2014-04-20 | Discharge: 2014-04-20 | Disposition: A | Discharge status: Home / Self Care | Payer: Medicare Other | Source: Ambulatory Visit | Attending: Radiation Oncology | Admitting: Radiation Oncology

## 2014-04-20 ENCOUNTER — Telehealth: Payer: Self-pay | Admitting: Hematology & Oncology

## 2014-04-20 ENCOUNTER — Ambulatory Visit
Admit: 2014-04-20 | Discharge: 2014-04-20 | Disposition: A | Discharge status: Home / Self Care | Payer: Medicare Other | Attending: Radiation Oncology | Admitting: Radiation Oncology

## 2014-04-20 ENCOUNTER — Telehealth: Payer: Self-pay | Admitting: Oncology

## 2014-04-20 DIAGNOSIS — C159 Malignant neoplasm of esophagus, unspecified: Secondary | ICD-10-CM | POA: Diagnosis not present

## 2014-04-20 MED ORDER — RADIAPLEXRX EX GEL
Freq: Once | CUTANEOUS | Status: AC
  Administered 2014-04-20: 16:00:00 via TOPICAL

## 2014-04-20 NOTE — Telephone Encounter (Signed)
Pt aware of 1-15 appointment

## 2014-04-20 NOTE — Progress Notes (Signed)
Sheila Young the Radiation Therapy and You book and discussed with her and her niece the potential side effects of radiation including fatigue, skin changes and throat changes.  Also gave her the skin care handout and Radiaplex gel.  Advised her to apply the radiaplex gel to her chest twice a day - after treatment and at bedtime.  She verbalized understanding.  Also educated her about under treat day on Tuesday with Dr. Sondra Come.  Her niece is wondering if Lonie could get treatment in [facility] because it is closer to where they live.  Also read her TB test from 04/18/14 in her right upper forearm.  It was negative.  Her form was signed.

## 2014-04-20 NOTE — Telephone Encounter (Signed)
Called [name] to make see if Sheila Young has transportation for radiation today.  [name] said she was brining her for her 2:40 appointment.

## 2014-04-21 ENCOUNTER — Ambulatory Visit
Admit: 2014-04-21 | Discharge: 2014-04-21 | Disposition: A | Discharge status: Home / Self Care | Payer: Medicare Other | Attending: Radiation Oncology | Admitting: Radiation Oncology

## 2014-04-21 DIAGNOSIS — C159 Malignant neoplasm of esophagus, unspecified: Secondary | ICD-10-CM | POA: Diagnosis not present

## 2014-04-22 ENCOUNTER — Encounter: Payer: Self-pay | Admitting: Hematology & Oncology

## 2014-04-22 ENCOUNTER — Encounter: Payer: Self-pay | Admitting: Family

## 2014-04-22 ENCOUNTER — Other Ambulatory Visit (HOSPITAL_BASED_OUTPATIENT_CLINIC_OR_DEPARTMENT_OTHER): Payer: Medicare Other | Admitting: Lab

## 2014-04-22 ENCOUNTER — Ambulatory Visit
Admit: 2014-04-22 | Discharge: 2014-04-22 | Disposition: A | Discharge status: Home / Self Care | Payer: Medicare Other | Attending: Radiation Oncology | Admitting: Radiation Oncology

## 2014-04-22 ENCOUNTER — Ambulatory Visit (HOSPITAL_BASED_OUTPATIENT_CLINIC_OR_DEPARTMENT_OTHER): Payer: Medicare Other | Admitting: Family

## 2014-04-22 ENCOUNTER — Ambulatory Visit (HOSPITAL_BASED_OUTPATIENT_CLINIC_OR_DEPARTMENT_OTHER): Payer: Medicare Other

## 2014-04-22 DIAGNOSIS — C159 Malignant neoplasm of esophagus, unspecified: Secondary | ICD-10-CM

## 2014-04-22 DIAGNOSIS — Z5111 Encounter for antineoplastic chemotherapy: Secondary | ICD-10-CM

## 2014-04-22 LAB — CMP (CANCER CENTER ONLY)
ALBUMIN: 2.7 g/dL — AB (ref 3.3–5.5)
ALK PHOS: 69 U/L (ref 26–84)
ALT: 19 U/L (ref 10–47)
AST: 15 U/L (ref 11–38)
BILIRUBIN TOTAL: 0.6 mg/dL (ref 0.20–1.60)
BUN, Bld: 22 mg/dL (ref 7–22)
CO2: 29 meq/L (ref 18–33)
CREATININE: 0.6 mg/dL (ref 0.6–1.2)
Calcium: 9.8 mg/dL (ref 8.0–10.3)
Chloride: 95 mEq/L — ABNORMAL LOW (ref 98–108)
GLUCOSE: 199 mg/dL — AB (ref 73–118)
Potassium: 3.4 mEq/L (ref 3.3–4.7)
Sodium: 136 mEq/L (ref 128–145)
Total Protein: 6.7 g/dL (ref 6.4–8.1)

## 2014-04-22 LAB — CBC WITH DIFFERENTIAL (CANCER CENTER ONLY)
BASO#: 0 10*3/uL (ref 0.0–0.2)
BASO%: 0.1 % (ref 0.0–2.0)
EOS ABS: 0.6 10*3/uL — AB (ref 0.0–0.5)
EOS%: 4.3 % (ref 0.0–7.0)
HEMATOCRIT: 32.2 % — AB (ref 34.8–46.6)
HEMOGLOBIN: 10.5 g/dL — AB (ref 11.6–15.9)
LYMPH#: 1 10*3/uL (ref 0.9–3.3)
LYMPH%: 7.2 % — ABNORMAL LOW (ref 14.0–48.0)
MCH: 28 pg (ref 26.0–34.0)
MCHC: 32.6 g/dL (ref 32.0–36.0)
MCV: 86 fL (ref 81–101)
MONO#: 0.7 10*3/uL (ref 0.1–0.9)
MONO%: 4.9 % (ref 0.0–13.0)
NEUT%: 83.5 % — AB (ref 39.6–80.0)
NEUTROS ABS: 12 10*3/uL — AB (ref 1.5–6.5)
Platelets: 303 10*3/uL (ref 145–400)
RBC: 3.75 10*6/uL (ref 3.70–5.32)
RDW: 16.1 % — AB (ref 11.1–15.7)
WBC: 14.4 10*3/uL — AB (ref 3.9–10.0)

## 2014-04-22 MED ORDER — DIPHENHYDRAMINE HCL 50 MG/ML IJ SOLN
INTRAMUSCULAR | Status: AC
  Filled 2014-04-22: qty 1

## 2014-04-22 MED ORDER — FLUOXETINE HCL 10 MG PO CAPS: 10.0000 mg | ORAL_CAPSULE | Freq: Every day | ORAL | Status: DC

## 2014-04-22 MED ORDER — DIPHENHYDRAMINE HCL 50 MG/ML IJ SOLN
50.0000 mg | Freq: Once | INTRAMUSCULAR | Status: AC
  Administered 2014-04-22: 50 mg via INTRAVENOUS

## 2014-04-22 MED ORDER — PACLITAXEL CHEMO INJECTION 300 MG/50ML
45.0000 mg/m2 | Freq: Once | INTRAVENOUS | Status: AC
  Administered 2014-04-22: 78 mg via INTRAVENOUS
  Filled 2014-04-22: qty 13

## 2014-04-22 MED ORDER — ONDANSETRON 16 MG/50ML IVPB (CHCC)
INTRAVENOUS | Status: AC
  Filled 2014-04-22: qty 16

## 2014-04-22 MED ORDER — FAMOTIDINE IN NACL 20-0.9 MG/50ML-% IV SOLN
INTRAVENOUS | Status: AC
  Filled 2014-04-22: qty 50

## 2014-04-22 MED ORDER — SODIUM CHLORIDE 0.9 % IV SOLN
200.0000 mg | Freq: Once | INTRAVENOUS | Status: AC
  Administered 2014-04-22: 200 mg via INTRAVENOUS
  Filled 2014-04-22: qty 20

## 2014-04-22 MED ORDER — DEXAMETHASONE SODIUM PHOSPHATE 20 MG/5ML IJ SOLN
INTRAMUSCULAR | Status: AC
  Filled 2014-04-22: qty 5

## 2014-04-22 MED ORDER — DEXAMETHASONE SODIUM PHOSPHATE 20 MG/5ML IJ SOLN
20.0000 mg | Freq: Once | INTRAMUSCULAR | Status: AC
  Administered 2014-04-22: 20 mg via INTRAVENOUS

## 2014-04-22 MED ORDER — HEPARIN SOD (PORK) LOCK FLUSH 100 UNIT/ML IV SOLN
500.0000 [IU] | Freq: Once | INTRAVENOUS | Status: AC | PRN
  Administered 2014-04-22: 500 [IU]
  Filled 2014-04-22: qty 5

## 2014-04-22 MED ORDER — ONDANSETRON 16 MG/50ML IVPB (CHCC)
16.0000 mg | Freq: Once | INTRAVENOUS | Status: AC
  Administered 2014-04-22: 16 mg via INTRAVENOUS

## 2014-04-22 MED ORDER — SODIUM CHLORIDE 0.9 % IV SOLN
Freq: Once | INTRAVENOUS | Status: AC
  Administered 2014-04-22: 11:00:00 via INTRAVENOUS

## 2014-04-22 MED ORDER — SODIUM CHLORIDE 0.9 % IJ SOLN
10.0000 mL | INTRAMUSCULAR | Status: DC | PRN
  Administered 2014-04-22: 10 mL
  Filled 2014-04-22: qty 10

## 2014-04-22 MED ORDER — FAMOTIDINE IN NACL 20-0.9 MG/50ML-% IV SOLN
20.0000 mg | Freq: Once | INTRAVENOUS | Status: AC
  Administered 2014-04-22: 20 mg via INTRAVENOUS

## 2014-04-22 NOTE — Patient Instructions (Signed)
Carboplatin injection  What is this medicine?  CARBOPLATIN (KAR boe pla tin) is a chemotherapy drug. It targets fast dividing cells, like cancer cells, and causes these cells to die. This medicine is used to treat ovarian cancer and many other cancers.  This medicine may be used for other purposes; ask your health care provider or pharmacist if you have questions.  COMMON BRAND NAME(S): Paraplatin  What should I tell my health care provider before I take this medicine?  They need to know if you have any of these conditions:  -blood disorders  -hearing problems  -kidney disease  -recent or ongoing radiation therapy  -an unusual or allergic reaction to carboplatin, cisplatin, other chemotherapy, other medicines, foods, dyes, or preservatives  -pregnant or trying to get pregnant  -breast-feeding  How should I use this medicine?  This drug is usually given as an infusion into a vein. It is administered in a hospital or clinic by a specially trained health care professional.  Talk to your pediatrician regarding the use of this medicine in children. Special care may be needed.  Overdosage: If you think you have taken too much of this medicine contact a poison control center or emergency room at once.  NOTE: This medicine is only for you. Do not share this medicine with others.  What if I miss a dose?  It is important not to miss a dose. Call your doctor or health care professional if you are unable to keep an appointment.  What may interact with this medicine?  -medicines for seizures  -medicines to increase blood counts like filgrastim, pegfilgrastim, sargramostim  -some antibiotics like amikacin, gentamicin, neomycin, streptomycin, tobramycin  -vaccines  Talk to your doctor or health care professional before taking any of these medicines:  -acetaminophen  -aspirin  -ibuprofen  -ketoprofen  -naproxen  This list may not describe all possible interactions. Give your health care provider a list of all the medicines, herbs,  non-prescription drugs, or dietary supplements you use. Also tell them if you smoke, drink alcohol, or use illegal drugs. Some items may interact with your medicine.  What should I watch for while using this medicine?  Your condition will be monitored carefully while you are receiving this medicine. You will need important blood work done while you are taking this medicine.  This drug may make you feel generally unwell. This is not uncommon, as chemotherapy can affect healthy cells as well as cancer cells. Report any side effects. Continue your course of treatment even though you feel ill unless your doctor tells you to stop.  In some cases, you may be given additional medicines to help with side effects. Follow all directions for their use.  Call your doctor or health care professional for advice if you get a fever, chills or sore throat, or other symptoms of a cold or flu. Do not treat yourself. This drug decreases your body's ability to fight infections. Try to avoid being around people who are sick.  This medicine may increase your risk to bruise or bleed. Call your doctor or health care professional if you notice any unusual bleeding.  Be careful brushing and flossing your teeth or using a toothpick because you may get an infection or bleed more easily. If you have any dental work done, tell your dentist you are receiving this medicine.  Avoid taking products that contain aspirin, acetaminophen, ibuprofen, naproxen, or ketoprofen unless instructed by your doctor. These medicines may hide a fever.  Do not become   pregnant while taking this medicine. Women should inform their doctor if they wish to become pregnant or think they might be pregnant. There is a potential for serious side effects to an unborn child. Talk to your health care professional or pharmacist for more information. Do not breast-feed an infant while taking this medicine.  What side effects may I notice from receiving this medicine?  Side effects  that you should report to your doctor or health care professional as soon as possible:  -allergic reactions like skin rash, itching or hives, swelling of the face, lips, or tongue  -signs of infection - fever or chills, cough, sore throat, pain or difficulty passing urine  -signs of decreased platelets or bleeding - bruising, pinpoint red spots on the skin, black, tarry stools, nosebleeds  -signs of decreased red blood cells - unusually weak or tired, fainting spells, lightheadedness  -breathing problems  -changes in hearing  -changes in vision  -chest pain  -high blood pressure  -low blood counts - This drug may decrease the number of white blood cells, red blood cells and platelets. You may be at increased risk for infections and bleeding.  -nausea and vomiting  -pain, swelling, redness or irritation at the injection site  -pain, tingling, numbness in the hands or feet  -problems with balance, talking, walking  -trouble passing urine or change in the amount of urine  Side effects that usually do not require medical attention (report to your doctor or health care professional if they continue or are bothersome):  -hair loss  -loss of appetite  -metallic taste in the mouth or changes in taste  This list may not describe all possible side effects. Call your doctor for medical advice about side effects. You may report side effects to FDA at 1-800-FDA-1088.  Where should I keep my medicine?  This drug is given in a hospital or clinic and will not be stored at home.  NOTE: This sheet is a summary. It may not cover all possible information. If you have questions about this medicine, talk to your doctor, pharmacist, or health care provider.   2015, [employer]vier/Gold Standard. (2007-06-30 14:38:05)  Paclitaxel injection  What is this medicine?  PACLITAXEL (PAK li TAX el) is a chemotherapy drug. It targets fast dividing cells, like cancer cells, and causes these cells to die. This medicine is used to treat ovarian cancer,  breast cancer, and other cancers.  This medicine may be used for other purposes; ask your health care provider or pharmacist if you have questions.  COMMON BRAND NAME(S): Onxol, Taxol  What should I tell my health care provider before I take this medicine?  They need to know if you have any of these conditions:  -blood disorders  -irregular heartbeat  -infection (especially a virus infection such as chickenpox, cold sores, or herpes)  -liver disease  -previous or ongoing radiation therapy  -an unusual or allergic reaction to paclitaxel, alcohol, polyoxyethylated castor oil, other chemotherapy agents, other medicines, foods, dyes, or preservatives  -pregnant or trying to get pregnant  -breast-feeding  How should I use this medicine?  This drug is given as an infusion into a vein. It is administered in a hospital or clinic by a specially trained health care professional.  Talk to your pediatrician regarding the use of this medicine in children. Special care may be needed.  Overdosage: If you think you have taken too much of this medicine contact a poison control center or emergency room at once.  NOTE:   This medicine is only for you. Do not share this medicine with others.  What if I miss a dose?  It is important not to miss your dose. Call your doctor or health care professional if you are unable to keep an appointment.  What may interact with this medicine?  Do not take this medicine with any of the following medications:  -disulfiram  -metronidazole  This medicine may also interact with the following medications:  -cyclosporine  -diazepam  -ketoconazole  -medicines to increase blood counts like filgrastim, pegfilgrastim, sargramostim  -other chemotherapy drugs like cisplatin, doxorubicin, epirubicin, etoposide, teniposide, vincristine  -quinidine  -testosterone  -vaccines  -verapamil  Talk to your doctor or health care professional before taking any of these  medicines:  -acetaminophen  -aspirin  -ibuprofen  -ketoprofen  -naproxen  This list may not describe all possible interactions. Give your health care provider a list of all the medicines, herbs, non-prescription drugs, or dietary supplements you use. Also tell them if you smoke, drink alcohol, or use illegal drugs. Some items may interact with your medicine.  What should I watch for while using this medicine?  Your condition will be monitored carefully while you are receiving this medicine. You will need important blood work done while you are taking this medicine.  This drug may make you feel generally unwell. This is not uncommon, as chemotherapy can affect healthy cells as well as cancer cells. Report any side effects. Continue your course of treatment even though you feel ill unless your doctor tells you to stop.  In some cases, you may be given additional medicines to help with side effects. Follow all directions for their use.  Call your doctor or health care professional for advice if you get a fever, chills or sore throat, or other symptoms of a cold or flu. Do not treat yourself. This drug decreases your body's ability to fight infections. Try to avoid being around people who are sick.  This medicine may increase your risk to bruise or bleed. Call your doctor or health care professional if you notice any unusual bleeding.  Be careful brushing and flossing your teeth or using a toothpick because you may get an infection or bleed more easily. If you have any dental work done, tell your dentist you are receiving this medicine.  Avoid taking products that contain aspirin, acetaminophen, ibuprofen, naproxen, or ketoprofen unless instructed by your doctor. These medicines may hide a fever.  Do not become pregnant while taking this medicine. Women should inform their doctor if they wish to become pregnant or think they might be pregnant. There is a potential for serious side effects to an unborn child. Talk to  your health care professional or pharmacist for more information. Do not breast-feed an infant while taking this medicine.  Men are advised not to father a child while receiving this medicine.  What side effects may I notice from receiving this medicine?  Side effects that you should report to your doctor or health care professional as soon as possible:  -allergic reactions like skin rash, itching or hives, swelling of the face, lips, or tongue  -low blood counts - This drug may decrease the number of white blood cells, red blood cells and platelets. You may be at increased risk for infections and bleeding.  -signs of infection - fever or chills, cough, sore throat, pain or difficulty passing urine  -signs of decreased platelets or bleeding - bruising, pinpoint red spots on the skin, black, tarry   stools, nosebleeds  -signs of decreased red blood cells - unusually weak or tired, fainting spells, lightheadedness  -breathing problems  -chest pain  -high or low blood pressure  -mouth sores  -nausea and vomiting  -pain, swelling, redness or irritation at the injection site  -pain, tingling, numbness in the hands or feet  -slow or irregular heartbeat  -swelling of the ankle, feet, hands  Side effects that usually do not require medical attention (report to your doctor or health care professional if they continue or are bothersome):  -bone pain  -complete hair loss including hair on your head, underarms, pubic hair, eyebrows, and eyelashes  -changes in the color of fingernails  -diarrhea  -loosening of the fingernails  -loss of appetite  -muscle or joint pain  -red flush to skin  -sweating  This list may not describe all possible side effects. Call your doctor for medical advice about side effects. You may report side effects to FDA at 1-800-FDA-1088.  Where should I keep my medicine?  This drug is given in a hospital or clinic and will not be stored at home.  NOTE: This sheet is a summary. It may not cover all possible  information. If you have questions about this medicine, talk to your doctor, pharmacist, or health care provider.   2015, [employer] Standard. (2012-05-18 16:41:21)

## 2014-04-22 NOTE — Progress Notes (Signed)
[facility]  [phone] [phone]  ID: Sheila Young OB: June 14, 1950 MR#: [id number] TKW#:[id number] Patient Care Team: Beckie Salts, MD as PCP - General (Internal Medicine)  DIAGNOSIS:  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 Dysphagia 3. Malnutrition 4. Anemia 5. Alcoholism   INTERVAL HISTORY: Sheila Young is here today with her neices for follow-up and treatment. She had cycle 1 of chemo while in the hospital and did well with it. Her only complaint today is that she is hungry and wants to advance her diet.  She also began radiation treatment last Tuesday (04/12/14).  She denies fever, chills, n/v, cough, rash, headache, dizziness, SOB, chest pain, palpitations, abdominal pain, constipation, diarrhea, blood in urine or stool.  No swelling, tenderness, numbness or tingling in her extremities. Her appetite is good and she is drinking lots of water.  Her nieces mentioned that she is "tired and very emotional." I spoke to Sheila Young about taking something to help with her anxiety and she is not wanting to do that.  CURRENT TREATMENT: Paclitaxel/Carboplatin q7d Radiation   REVIEW OF SYSTEMS: All other 10 point review of systems is negative.   PAST MEDICAL HISTORY: Past Medical History  Diagnosis Date  . Stroke   . Hypercholesteremia   . Hypertension   . Hypokalemia   . History of noncompliance with medical treatment   . High aldosterone   . Anger   . Mammogram abnormal   . Esophagus cancer 04/07/2014  . Mechanical dysphagia 04/07/2014    PAST SURGICAL HISTORY: No past surgical history on file.  FAMILY HISTORY No family history on file.  GYNECOLOGIC HISTORY:  No LMP recorded. Patient is postmenopausal.   SOCIAL HISTORY: History   Social History  . Marital Status: Single    Spouse Name: N/A    Number of Children: N/A  . Years of Education: N/A   Occupational History  . Not on file.   Social History Main Topics  .  Smoking status: Current Every Day Smoker -- 0.50 packs/day for 45 years    Types: Cigarettes    Start date: 06/05/1968  . Smokeless tobacco: Never Used     Comment: 03-2014 still smoking  . Alcohol Use: No  . Drug Use: Not on file  . Sexual Activity: Not Currently   Other Topics Concern  . Not on file   Social History Narrative    ADVANCED DIRECTIVES:  <no information>  HEALTH MAINTENANCE: History  Substance Use Topics  . Smoking status: Current Every Day Smoker -- 0.50 packs/day for 45 years    Types: Cigarettes    Start date: 06/05/1968  . Smokeless tobacco: Never Used     Comment: 03-2014 still smoking  . Alcohol Use: No   Colonoscopy: PAP: Bone density: Lipid panel:  No Known Allergies  Current Outpatient Prescriptions  Medication Sig Dispense Refill  . amLODipine (NORVASC) 10 MG tablet Take 10 mg by mouth daily.    Marland Kitchen atorvastatin (LIPITOR) 40 MG tablet Take 40 mg by mouth every morning.   3  . clopidogrel (PLAVIX) 75 MG tablet Take 75 mg by mouth daily.    . folic acid (FOLVITE) 1 MG tablet Take 1 tablet (1 mg total) by mouth daily. 30 tablet 4  . hyaluronate sodium (RADIAPLEXRX) GEL Apply 1 application topically 2 (two) times daily. Apply to chest twice a day - after treatment and at bedime.    . lidocaine-prilocaine (EMLA) cream Apply topically once. Apply to port a cath site  1 hour PRIOR to port access 30 g 0  . LORazepam (ATIVAN) 0.5 MG tablet Take 1 tablet (0.5 mg total) by mouth every 6 (six) hours as needed (nausea,  vomiting). 30 tablet 0  . Nutritional Supplements (OSMOLITE HN PLUS) LIQD Place contents of 1 can into feeding tube 4 times a day. 120 Can 3  . ondansetron (ZOFRAN) 8 MG tablet Take 1 tablet (8 mg total) by mouth 2 (two) times daily. Start the day after chemo for 3 days. Then take as needed for nausea or vomiting. 30 tablet 1  . oxyCODONE (ROXICODONE INTENSOL) 20 MG/ML concentrated solution Take 0.3-0.5 mLs (6-10 mg total) by mouth every 4 (four)  hours as needed for severe pain. 30 mL 0  . pantoprazole (PROTONIX) 40 MG tablet Take 1 tablet (40 mg total) by mouth daily. 30 tablet 3  . potassium chloride 20 MEQ/15ML (10%) SOLN Take 10 mEq by mouth daily.   5  . prochlorperazine (COMPAZINE) 10 MG tablet Take 1 tablet (10 mg total) by mouth every 6 (six) hours as needed for nausea or vomiting. 60 tablet 3  . dexamethasone (DECADRON) 4 MG tablet Take 2 tablets (8 mg total) by mouth 2 (two) times daily with a meal. Start the day after chemotherapy for 3 days. (Patient not taking: Reported on 04/22/2014) 30 tablet 1  . lisinopril (PRINIVIL,ZESTRIL) 40 MG tablet   3   No current facility-administered medications for this visit.    OBJECTIVE: Filed Vitals:   04/22/14 1022  BP: 96/63  Pulse: 103  Temp: 98.2 F (36.8 C)  Resp: 16    Filed Weights   04/22/14 1022  Weight: 142 lb (64.411 kg)   ECOG FS:1 - Symptomatic but completely ambulatory Ocular: Sclerae unicteric, pupils equal, round and reactive to light Ear-nose-throat: Oropharynx clear, dentition fair Lymphatic: No cervical or supraclavicular adenopathy Lungs no rales or rhonchi, good excursion bilaterally Heart regular rate and rhythm, no murmur appreciated Abd soft, nontender, positive bowel sounds MSK no focal spinal tenderness, no joint edema Neuro: non-focal, well-oriented, appropriate affect Breasts: Deferred  LAB RESULTS: CMP     Component Value Date/Time   NA 136 04/22/2014 1003   NA 137 04/19/2014 0645   K 3.4 04/22/2014 1003   K 3.2* 04/19/2014 0645   CL 95* 04/22/2014 1003   CL 100 04/19/2014 0645   CO2 29 04/22/2014 1003   CO2 28 04/19/2014 0645   GLUCOSE 199* 04/22/2014 1003   GLUCOSE 128* 04/19/2014 0645   BUN 22 04/22/2014 1003   BUN 15 04/19/2014 0645   CREATININE 0.6 04/22/2014 1003   CREATININE 0.63 04/19/2014 0645   CALCIUM 9.8 04/22/2014 1003   CALCIUM 9.8 04/19/2014 0645   PROT 6.7 04/22/2014 1003   PROT 6.3 04/19/2014 0645   ALBUMIN 2.8*  04/19/2014 0645   AST 15 04/22/2014 1003   AST 20 04/19/2014 0645   ALT 19 04/22/2014 1003   ALT 20 04/19/2014 0645   ALKPHOS 69 04/22/2014 1003   ALKPHOS 66 04/19/2014 0645   BILITOT 0.60 04/22/2014 1003   BILITOT 0.7 04/19/2014 0645   GFRNONAA >90 04/19/2014 0645   GFRAA >90 04/19/2014 0645   INo results found for: SPEP, UPEP Lab Results  Component Value Date   WBC 14.4* 04/22/2014   NEUTROABS 12.0* 04/22/2014   HGB 10.5* 04/22/2014   HCT 32.2* 04/22/2014   MCV 86 04/22/2014   PLT 303 04/22/2014   No results found for: LABCA2 No components found for: KJZPH150 No results  for input(s): INR in the last 168 hours. Urinalysis No results found for: COLORURINE, APPEARANCEUR, LABSPEC, PHURINE, GLUCOSEU, HGBUR, BILIRUBINUR, KETONESUR, PROTEINUR, UROBILINOGEN, NITRITE, LEUKOCYTESUR STUDIES:  ASSESSMENT/PLAN: Sheila Young is here today with her neices for follow-up and treatment. She had cycle 1 of chemo while in the hospital and did well with it. Her only complaint today is that she is hungry and wants to advance her diet.  She also began radiation treatment last Tuesday (04/12/14).  We will proceed today with cycle 2 of Paclitaxel/Carboplatin today.  Today WBC 14.4 Hgb 10.5, MCV 86 platelets 303.  I gave her niece a letter for advancing her diet (sof/pureed) to take to the nursing home.  We will also start her on Prozac 10 mg daily for her depression.  We will give her an appointment and treatment schedule today.  She and her nieces know to cal here with any questions or concerns and to go to the ED in the event of an emergency. We can certainly see her sooner if need be.   Eliezer Bottom, NP 04/22/2014 11:04 AM

## 2014-04-25 ENCOUNTER — Ambulatory Visit
Admit: 2014-04-25 | Discharge: 2014-04-25 | Disposition: A | Discharge status: Home / Self Care | Payer: Medicare Other | Attending: Radiation Oncology | Admitting: Radiation Oncology

## 2014-04-25 DIAGNOSIS — C159 Malignant neoplasm of esophagus, unspecified: Secondary | ICD-10-CM | POA: Diagnosis not present

## 2014-04-26 ENCOUNTER — Ambulatory Visit
Admission: RE | Admit: 2014-04-26 | Discharge: 2014-04-26 | Disposition: A | Discharge status: Home / Self Care | Payer: Medicare Other | Source: Ambulatory Visit | Attending: Radiation Oncology | Admitting: Radiation Oncology

## 2014-04-26 ENCOUNTER — Ambulatory Visit
Admit: 2014-04-26 | Discharge: 2014-04-26 | Disposition: A | Discharge status: Home / Self Care | Payer: Medicare Other | Attending: Radiation Oncology | Admitting: Radiation Oncology

## 2014-04-26 ENCOUNTER — Encounter: Payer: Self-pay | Admitting: Radiation Oncology

## 2014-04-26 VITALS — BP 125/77 | HR 90 | Temp 98.5°F | Resp 12 | Ht 66.0 in | Wt 145.6 lb

## 2014-04-26 DIAGNOSIS — C159 Malignant neoplasm of esophagus, unspecified: Secondary | ICD-10-CM

## 2014-04-26 NOTE — Progress Notes (Signed)
  Radiation Oncology         [phone] ________________________________  Name: Sheila Young MRN: [id number]  Date: 04/26/2014  DOB: [birth year]  Weekly Radiation Therapy Management   ICD-9-CM ICD-10-CM    1. 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 . . . . . . . The patient presents for routine under treatment assessment.                                   The patient is without complaint. She is out of the hospital and currently living in a family home in [place] near [place]. She has been transitioned to a soft diet by medical oncology and is tolerating this well. The patient does have a good appetite at this time.                                 Set-up films were reviewed.                                 The chart was checked. Physical Findings. . .  height is 5\' 6"  (1.676 m) and weight is 145 lb 9.6 oz (66.044 kg). Her oral temperature is 98.5 F (36.9 C). Her blood pressure is 125/77 and her pulse is 90. Her respiration is 12 and oxygen saturation is 100%. . The lungs are clear. The heart has a regular rhythm and rate. Impression . . . . . . . The patient is tolerating radiation. Plan . . . . . . . . . . . . Continue treatment as planned.  ________________________________   Blair Promise, PhD, MD

## 2014-04-26 NOTE — Progress Notes (Signed)
Sheila Young has completed 6 fractions to her distal esophagus.  She denies having pain or trouble swallowing.  She has been changed to a soft diet.  She has gained 3 lbs since 04/22/14.  She is also putting 4 cans of Osmolite through her feeding tube per day.  Her feeding tube does have a small amount of yellow drainage around her tube.  She denies nausea.  She reports having a productive cough with white sputum that started a week ago.  She has an appointment with her family doctor tomorrow.

## 2014-04-27 ENCOUNTER — Ambulatory Visit
Admit: 2014-04-27 | Discharge: 2014-04-27 | Disposition: A | Discharge status: Home / Self Care | Payer: Medicare Other | Attending: Radiation Oncology | Admitting: Radiation Oncology

## 2014-04-27 DIAGNOSIS — C159 Malignant neoplasm of esophagus, unspecified: Secondary | ICD-10-CM | POA: Diagnosis not present

## 2014-04-28 ENCOUNTER — Ambulatory Visit
Admit: 2014-04-28 | Discharge: 2014-04-28 | Disposition: A | Discharge status: Home / Self Care | Payer: Medicare Other | Attending: Radiation Oncology | Admitting: Radiation Oncology

## 2014-04-28 DIAGNOSIS — C159 Malignant neoplasm of esophagus, unspecified: Secondary | ICD-10-CM | POA: Diagnosis not present

## 2014-04-29 ENCOUNTER — Other Ambulatory Visit: Payer: Medicare Other | Admitting: Lab

## 2014-04-29 ENCOUNTER — Ambulatory Visit: Payer: Medicare Other

## 2014-04-29 ENCOUNTER — Ambulatory Visit: Payer: Medicare Other | Admitting: Family

## 2014-05-01 ENCOUNTER — Ambulatory Visit: Payer: Medicare Other

## 2014-05-02 ENCOUNTER — Ambulatory Visit: Payer: Medicare Other

## 2014-05-02 ENCOUNTER — Ambulatory Visit
Admit: 2014-05-02 | Discharge: 2014-05-02 | Disposition: A | Discharge status: Home / Self Care | Payer: Medicare Other | Attending: Radiation Oncology | Admitting: Radiation Oncology

## 2014-05-02 DIAGNOSIS — C159 Malignant neoplasm of esophagus, unspecified: Secondary | ICD-10-CM | POA: Diagnosis not present

## 2014-05-03 ENCOUNTER — Ambulatory Visit
Admit: 2014-05-03 | Discharge: 2014-05-03 | Disposition: A | Discharge status: Home / Self Care | Payer: Medicare Other | Attending: Radiation Oncology | Admitting: Radiation Oncology

## 2014-05-03 ENCOUNTER — Ambulatory Visit
Admission: RE | Admit: 2014-05-03 | Discharge: 2014-05-03 | Disposition: A | Discharge status: Home / Self Care | Payer: Medicare Other | Source: Ambulatory Visit | Attending: Radiation Oncology | Admitting: Radiation Oncology

## 2014-05-03 ENCOUNTER — Ambulatory Visit: Payer: Medicare Other

## 2014-05-03 ENCOUNTER — Encounter: Payer: Self-pay | Admitting: Radiation Oncology

## 2014-05-03 VITALS — BP 133/66 | HR 64 | Temp 99.0°F | Resp 12 | Ht 66.0 in | Wt 143.8 lb

## 2014-05-03 DIAGNOSIS — C159 Malignant neoplasm of esophagus, unspecified: Secondary | ICD-10-CM

## 2014-05-03 NOTE — Progress Notes (Signed)
  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 . . . . . . . The patient presents for routine under treatment assessment.                                   The patient is without complaint. She is drinking liquids and taking in soft foods such as eggs and mashed potatoes. She is also ate some limited chicken.                                 Set-up films were reviewed.                                 The chart was checked. Physical Findings. . .  height is 5\' 6"  (1.676 m) and weight is 143 lb 12.8 oz (65.227 kg). Her oral temperature is 99 F (37.2 C). Her blood pressure is 133/66 and her pulse is 64. Her respiration is 12 and oxygen saturation is 100%. . The lungs are clear. The heart has a regular rhythm and rate. PEG site shows some mild erythema along the entrance site related to her tube. No obvious signs of infection in this area. Impression . . . . . . . The patient is tolerating radiation. Plan . . . . . . . . . . . . Continue treatment as planned.  ________________________________   Blair Promise, PhD, MD

## 2014-05-03 NOTE — Progress Notes (Signed)
Sheila Young has completed 10 fractions to her distal esophagus.  She denies pain and trouble swallowing.  She reports she is eating a soft diet without any problems.  She continues to take in 4 cans of Osmolite through her feeding tube daily.  She does have some redness around her feeding tube.  She has some yellow drainage around her tube.  She denies fatigue.  She has her next cycle of chemotherapy on Friday.

## 2014-05-04 ENCOUNTER — Ambulatory Visit
Admit: 2014-05-04 | Discharge: 2014-05-04 | Disposition: A | Discharge status: Home / Self Care | Payer: Medicare Other | Attending: Radiation Oncology | Admitting: Radiation Oncology

## 2014-05-04 ENCOUNTER — Ambulatory Visit: Payer: Medicare Other

## 2014-05-04 DIAGNOSIS — C159 Malignant neoplasm of esophagus, unspecified: Secondary | ICD-10-CM | POA: Diagnosis not present

## 2014-05-05 ENCOUNTER — Ambulatory Visit: Payer: Medicare Other

## 2014-05-05 ENCOUNTER — Ambulatory Visit
Admission: RE | Admit: 2014-05-05 | Discharge: 2014-05-05 | Disposition: A | Discharge status: Home / Self Care | Payer: Medicare Other | Source: Ambulatory Visit | Attending: Radiation Oncology | Admitting: Radiation Oncology

## 2014-05-05 DIAGNOSIS — C159 Malignant neoplasm of esophagus, unspecified: Secondary | ICD-10-CM | POA: Diagnosis not present

## 2014-05-06 ENCOUNTER — Ambulatory Visit
Admit: 2014-05-06 | Discharge: 2014-05-06 | Disposition: A | Discharge status: Home / Self Care | Payer: Medicare Other | Attending: Radiation Oncology | Admitting: Radiation Oncology

## 2014-05-06 ENCOUNTER — Ambulatory Visit (HOSPITAL_BASED_OUTPATIENT_CLINIC_OR_DEPARTMENT_OTHER): Payer: Medicare Other | Admitting: Family

## 2014-05-06 ENCOUNTER — Other Ambulatory Visit (HOSPITAL_BASED_OUTPATIENT_CLINIC_OR_DEPARTMENT_OTHER): Payer: Medicare Other | Admitting: Lab

## 2014-05-06 ENCOUNTER — Ambulatory Visit (HOSPITAL_BASED_OUTPATIENT_CLINIC_OR_DEPARTMENT_OTHER): Payer: Medicare Other

## 2014-05-06 ENCOUNTER — Other Ambulatory Visit: Payer: Self-pay | Admitting: *Deleted

## 2014-05-06 ENCOUNTER — Encounter: Payer: Self-pay | Admitting: Family

## 2014-05-06 ENCOUNTER — Ambulatory Visit: Payer: Medicare Other

## 2014-05-06 DIAGNOSIS — C159 Malignant neoplasm of esophagus, unspecified: Secondary | ICD-10-CM

## 2014-05-06 DIAGNOSIS — Z5111 Encounter for antineoplastic chemotherapy: Secondary | ICD-10-CM

## 2014-05-06 DIAGNOSIS — R1319 Other dysphagia: Secondary | ICD-10-CM

## 2014-05-06 LAB — CBC WITH DIFFERENTIAL (CANCER CENTER ONLY)
BASO#: 0 10*3/uL (ref 0.0–0.2)
BASO%: 0.6 % (ref 0.0–2.0)
EOS%: 0.8 % (ref 0.0–7.0)
Eosinophils Absolute: 0.1 10*3/uL (ref 0.0–0.5)
HEMATOCRIT: 32.3 % — AB (ref 34.8–46.6)
HEMOGLOBIN: 10.6 g/dL — AB (ref 11.6–15.9)
LYMPH#: 0.4 10*3/uL — AB (ref 0.9–3.3)
LYMPH%: 5.7 % — ABNORMAL LOW (ref 14.0–48.0)
MCH: 28.7 pg (ref 26.0–34.0)
MCHC: 32.8 g/dL (ref 32.0–36.0)
MCV: 88 fL (ref 81–101)
MONO#: 0.7 10*3/uL (ref 0.1–0.9)
MONO%: 11.1 % (ref 0.0–13.0)
NEUT#: 5.3 10*3/uL (ref 1.5–6.5)
NEUT%: 81.8 % — ABNORMAL HIGH (ref 39.6–80.0)
Platelets: 198 10*3/uL (ref 145–400)
RBC: 3.69 10*6/uL — ABNORMAL LOW (ref 3.70–5.32)
RDW: 17 % — ABNORMAL HIGH (ref 11.1–15.7)
WBC: 6.5 10*3/uL (ref 3.9–10.0)

## 2014-05-06 LAB — CMP (CANCER CENTER ONLY)
ALBUMIN: 2.9 g/dL — AB (ref 3.3–5.5)
ALT(SGPT): 11 U/L (ref 10–47)
AST: 18 U/L (ref 11–38)
Alkaline Phosphatase: 68 U/L (ref 26–84)
BUN, Bld: 10 mg/dL (ref 7–22)
CO2: 30 mEq/L (ref 18–33)
CREATININE: 0.5 mg/dL — AB (ref 0.6–1.2)
Calcium: 9.6 mg/dL (ref 8.0–10.3)
Chloride: 100 mEq/L (ref 98–108)
GLUCOSE: 110 mg/dL (ref 73–118)
Potassium: 2.4 mEq/L — CL (ref 3.3–4.7)
Sodium: 140 mEq/L (ref 128–145)
TOTAL PROTEIN: 7.2 g/dL (ref 6.4–8.1)
Total Bilirubin: 0.6 mg/dl (ref 0.20–1.60)

## 2014-05-06 MED ORDER — DIPHENHYDRAMINE HCL 50 MG/ML IJ SOLN
50.0000 mg | Freq: Once | INTRAMUSCULAR | Status: AC
  Administered 2014-05-06: 50 mg via INTRAVENOUS

## 2014-05-06 MED ORDER — FLUOXETINE HCL 10 MG PO CAPS: 10.0000 mg | ORAL_CAPSULE | Freq: Every day | ORAL | Status: DC

## 2014-05-06 MED ORDER — SODIUM CHLORIDE 0.9 % IJ SOLN
3.0000 mL | INTRAMUSCULAR | Status: DC | PRN
  Filled 2014-05-06: qty 10

## 2014-05-06 MED ORDER — ONDANSETRON 16 MG/50ML IVPB (CHCC)
16.0000 mg | Freq: Once | INTRAVENOUS | Status: AC
  Administered 2014-05-06: 16 mg via INTRAVENOUS

## 2014-05-06 MED ORDER — PACLITAXEL CHEMO INJECTION 300 MG/50ML
45.0000 mg/m2 | Freq: Once | INTRAVENOUS | Status: AC
  Administered 2014-05-06: 78 mg via INTRAVENOUS
  Filled 2014-05-06: qty 13

## 2014-05-06 MED ORDER — ALTEPLASE 2 MG IJ SOLR
2.0000 mg | Freq: Once | INTRAMUSCULAR | Status: DC | PRN
  Filled 2014-05-06: qty 2

## 2014-05-06 MED ORDER — ONDANSETRON 16 MG/50ML IVPB (CHCC)
INTRAVENOUS | Status: AC
  Filled 2014-05-06: qty 16

## 2014-05-06 MED ORDER — HEPARIN SOD (PORK) LOCK FLUSH 100 UNIT/ML IV SOLN
250.0000 [IU] | Freq: Once | INTRAVENOUS | Status: DC | PRN
  Filled 2014-05-06: qty 5

## 2014-05-06 MED ORDER — SODIUM CHLORIDE 0.9 % IV SOLN
Freq: Once | INTRAVENOUS | Status: AC
  Administered 2014-05-06: 10:00:00 via INTRAVENOUS

## 2014-05-06 MED ORDER — DIPHENHYDRAMINE HCL 50 MG/ML IJ SOLN
INTRAMUSCULAR | Status: AC
  Filled 2014-05-06: qty 1

## 2014-05-06 MED ORDER — FAMOTIDINE IN NACL 20-0.9 MG/50ML-% IV SOLN
20.0000 mg | Freq: Once | INTRAVENOUS | Status: AC
  Administered 2014-05-06: 20 mg via INTRAVENOUS

## 2014-05-06 MED ORDER — SODIUM CHLORIDE 0.9 % IV SOLN
200.0000 mg | Freq: Once | INTRAVENOUS | Status: AC
  Administered 2014-05-06: 200 mg via INTRAVENOUS
  Filled 2014-05-06: qty 20

## 2014-05-06 MED ORDER — DEXAMETHASONE SODIUM PHOSPHATE 20 MG/5ML IJ SOLN
INTRAMUSCULAR | Status: AC
  Filled 2014-05-06: qty 5

## 2014-05-06 MED ORDER — FAMOTIDINE IN NACL 20-0.9 MG/50ML-% IV SOLN
INTRAVENOUS | Status: AC
  Filled 2014-05-06: qty 50

## 2014-05-06 MED ORDER — SODIUM CHLORIDE 0.9 % IJ SOLN
10.0000 mL | INTRAMUSCULAR | Status: DC | PRN
  Administered 2014-05-06: 10 mL
  Filled 2014-05-06: qty 10

## 2014-05-06 MED ORDER — POTASSIUM CHLORIDE 40 MEQ/15ML (20%) PO SOLN: 40.0000 meq | Freq: Every day | ORAL | Status: DC

## 2014-05-06 MED ORDER — HEPARIN SOD (PORK) LOCK FLUSH 100 UNIT/ML IV SOLN
500.0000 [IU] | Freq: Once | INTRAVENOUS | Status: AC | PRN
  Administered 2014-05-06: 500 [IU]
  Filled 2014-05-06: qty 5

## 2014-05-06 MED ORDER — COLD PACK MISC ONCOLOGY
1.0000 | Freq: Once | Status: DC | PRN
  Filled 2014-05-06: qty 1

## 2014-05-06 MED ORDER — DEXAMETHASONE SODIUM PHOSPHATE 20 MG/5ML IJ SOLN
20.0000 mg | Freq: Once | INTRAMUSCULAR | Status: AC
  Administered 2014-05-06: 20 mg via INTRAVENOUS

## 2014-05-06 NOTE — Patient Instructions (Signed)
[facility] Discharge Instructions for Patients Receiving Chemotherapy  Today you received the following chemotherapy agents: Taxol and Carboplatin.  To help prevent nausea and vomiting after your treatment, we encourage you to take your nausea medication as prescribed.   If you develop nausea and vomiting that is not controlled by your nausea medication, call the clinic.   BELOW ARE SYMPTOMS THAT SHOULD BE REPORTED IMMEDIATELY:  *FEVER GREATER THAN 100.5 F  *CHILLS WITH OR WITHOUT FEVER  NAUSEA AND VOMITING THAT IS NOT CONTROLLED WITH YOUR NAUSEA MEDICATION  *UNUSUAL SHORTNESS OF BREATH  *UNUSUAL BRUISING OR BLEEDING  TENDERNESS IN MOUTH AND THROAT WITH OR WITHOUT PRESENCE OF ULCERS  *URINARY PROBLEMS  *BOWEL PROBLEMS  UNUSUAL RASH Items with * indicate a potential emergency and should be followed up as soon as possible.  Feel free to call the clinic you have any questions or concerns. The clinic phone number is [phone].    

## 2014-05-06 NOTE — Progress Notes (Signed)
[facility]  Telephone:([phone] Fax:(336) [phone]  ID: Bethanne Ginger OB: 11/29/1950 MR#: [id number] MEQ#:[id number] Patient Care Team: Beckie Salts, MD as PCP - General (Internal Medicine)  DIAGNOSIS:  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 Dysphagia 3. Malnutrition 4. Anemia 5. Alcoholism   INTERVAL HISTORY: Ms. Sheila Young is here today with her neices for follow-up and treatment. She is feeling much better and smiling today. Her swallowing is improving and she is doing well with her G-tube. She denies fever, chills, n/v, cough, rash, headache, dizziness, SOB, chest pain, palpitations, abdominal pain, constipation, diarrhea, blood in urine or stool.  No swelling, tenderness, numbness or tingling in her extremities. Her appetite is good and she is drinking lots of water. Her weight is stable at 143 lbs.  Her potassium is 2.4 today so we will give her potassium liquid to take this week.   CURRENT TREATMENT: Paclitaxel/Carboplatin q7d Radiation   REVIEW OF SYSTEMS: All other 10 point review of systems is negative.   PAST MEDICAL HISTORY: Past Medical History  Diagnosis Date  . Stroke   . Hypercholesteremia   . Hypertension   . Hypokalemia   . History of noncompliance with medical treatment   . High aldosterone   . Anger   . Mammogram abnormal   . Esophagus cancer 04/07/2014  . Mechanical dysphagia 04/07/2014    PAST SURGICAL HISTORY: No past surgical history on file.  FAMILY HISTORY No family history on file.  GYNECOLOGIC HISTORY:  No LMP recorded. Patient is postmenopausal.   SOCIAL HISTORY: History   Social History  . Marital Status: Single    Spouse Name: N/A    Number of Children: N/A  . Years of Education: N/A   Occupational History  . Not on file.   Social History Main Topics  . Smoking status: Current Every Day Smoker -- 0.50 packs/day for 45 years    Types: Cigarettes    Start date: 06/05/1968  . Smokeless tobacco: Never  Used     Comment: 03-2014 still smoking  . Alcohol Use: No  . Drug Use: Not on file  . Sexual Activity: Not Currently   Other Topics Concern  . Not on file   Social History Narrative    ADVANCED DIRECTIVES:  <no information>  HEALTH MAINTENANCE: History  Substance Use Topics  . Smoking status: Current Every Day Smoker -- 0.50 packs/day for 45 years    Types: Cigarettes    Start date: 06/05/1968  . Smokeless tobacco: Never Used     Comment: 03-2014 still smoking  . Alcohol Use: No   Colonoscopy: PAP: Bone density: Lipid panel:  No Known Allergies  Current Outpatient Prescriptions  Medication Sig Dispense Refill  . amLODipine (NORVASC) 10 MG tablet Take 10 mg by mouth daily.    Marland Kitchen atorvastatin (LIPITOR) 40 MG tablet Take 40 mg by mouth every morning.   3  . clopidogrel (PLAVIX) 75 MG tablet Take 75 mg by mouth daily.    Marland Kitchen dexamethasone (DECADRON) 4 MG tablet Take 2 tablets (8 mg total) by mouth 2 (two) times daily with a meal. Start the day after chemotherapy for 3 days. 30 tablet 1  . fexofenadine (ALLEGRA) 180 MG tablet Take 180 mg by mouth.    Marland Kitchen FLUoxetine (PROZAC) 10 MG capsule Take 1 capsule (10 mg total) by mouth daily. 30 capsule 0  . folic acid (FOLVITE) 1 MG tablet Take 1 tablet (1 mg total) by mouth daily. 30 tablet 4  . hyaluronate  sodium (RADIAPLEXRX) GEL Apply 1 application topically 2 (two) times daily. Apply to chest twice a day - after treatment and at bedime.    . lidocaine-prilocaine (EMLA) cream Apply topically once. Apply to port a cath site 1 hour PRIOR to port access 30 g 0  . lisinopril (PRINIVIL,ZESTRIL) 40 MG tablet   3  . LORazepam (ATIVAN) 0.5 MG tablet Take 1 tablet (0.5 mg total) by mouth every 6 (six) hours as needed (nausea,  vomiting). 30 tablet 0  . Nutritional Supplements (OSMOLITE HN PLUS) LIQD Place contents of 1 can into feeding tube 4 times a day. 120 Can 3  . ondansetron (ZOFRAN) 8 MG tablet Take 1 tablet (8 mg total) by mouth 2 (two)  times daily. Start the day after chemo for 3 days. Then take as needed for nausea or vomiting. (Patient not taking: Reported on 05/03/2014) 30 tablet 1  . oxyCODONE (ROXICODONE INTENSOL) 20 MG/ML concentrated solution Take 0.3-0.5 mLs (6-10 mg total) by mouth every 4 (four) hours as needed for severe pain. (Patient not taking: Reported on 04/26/2014) 30 mL 0  . pantoprazole (PROTONIX) 40 MG tablet Take 1 tablet (40 mg total) by mouth daily. 30 tablet 3  . potassium chloride 20 MEQ/15ML (10%) SOLN Take 10 mEq by mouth daily.   5  . prochlorperazine (COMPAZINE) 10 MG tablet Take 1 tablet (10 mg total) by mouth every 6 (six) hours as needed for nausea or vomiting. 60 tablet 3   No current facility-administered medications for this visit.    OBJECTIVE: There were no vitals filed for this visit.  There were no vitals filed for this visit. ECOG FS:1 - Symptomatic but completely ambulatory Ocular: Sclerae unicteric, pupils equal, round and reactive to light Ear-nose-throat: Oropharynx clear, dentition fair Lymphatic: No cervical or supraclavicular adenopathy Lungs no rales or rhonchi, good excursion bilaterally Heart regular rate and rhythm, no murmur appreciated Abd soft, nontender, positive bowel sounds MSK no focal spinal tenderness, no joint edema Neuro: non-focal, well-oriented, appropriate affect Breasts: Deferred  LAB RESULTS: CMP     Component Value Date/Time   NA 136 04/22/2014 1003   NA 137 04/19/2014 0645   K 3.4 04/22/2014 1003   K 3.2* 04/19/2014 0645   CL 95* 04/22/2014 1003   CL 100 04/19/2014 0645   CO2 29 04/22/2014 1003   CO2 28 04/19/2014 0645   GLUCOSE 199* 04/22/2014 1003   GLUCOSE 128* 04/19/2014 0645   BUN 22 04/22/2014 1003   BUN 15 04/19/2014 0645   CREATININE 0.6 04/22/2014 1003   CREATININE 0.63 04/19/2014 0645   CALCIUM 9.8 04/22/2014 1003   CALCIUM 9.8 04/19/2014 0645   PROT 6.7 04/22/2014 1003   PROT 6.3 04/19/2014 0645   ALBUMIN 2.8* 04/19/2014 0645    AST 15 04/22/2014 1003   AST 20 04/19/2014 0645   ALT 19 04/22/2014 1003   ALT 20 04/19/2014 0645   ALKPHOS 69 04/22/2014 1003   ALKPHOS 66 04/19/2014 0645   BILITOT 0.60 04/22/2014 1003   BILITOT 0.7 04/19/2014 0645   GFRNONAA >90 04/19/2014 0645   GFRAA >90 04/19/2014 0645   INo results found for: SPEP, UPEP Lab Results  Component Value Date   WBC 6.5 05/06/2014   NEUTROABS 5.3 05/06/2014   HGB 10.6* 05/06/2014   HCT 32.3* 05/06/2014   MCV 88 05/06/2014   PLT 198 05/06/2014   No results found for: LABCA2 No components found for: [id number] No results for input(s): INR in the last 168 hours. Urinalysis  No results found for: COLORURINE, APPEARANCEUR, LABSPEC, PHURINE, GLUCOSEU, HGBUR, BILIRUBINUR, KETONESUR, PROTEINUR, UROBILINOGEN, NITRITE, LEUKOCYTESUR STUDIES:  ASSESSMENT/PLAN: Ms. Bubar is here today with her neices for follow-up and treatment. She is feeling much better. Her swallowing is improving.  She is also doing well with her radiation treatments.   We will proceed today with cycle 3 of Paclitaxel/Carboplatin today. Liquid potassium 40 meq daily this week for K 2.4.   Today WBC 6.5 Hgb 10.6, MCV 88 platelets 198.  We will give her an appointment and treatment schedule today.  She and her nieces know to call here with any questions or concerns and to go to the ED in the event of an emergency. We can certainly see her sooner if need be.   Eliezer Bottom, NP 05/06/2014 9:38 AM

## 2014-05-08 ENCOUNTER — Ambulatory Visit: Payer: Medicare Other

## 2014-05-09 ENCOUNTER — Ambulatory Visit: Payer: Medicare Other

## 2014-05-09 ENCOUNTER — Ambulatory Visit
Admit: 2014-05-09 | Discharge: 2014-05-09 | Disposition: A | Discharge status: Home / Self Care | Payer: Medicare Other | Attending: Radiation Oncology | Admitting: Radiation Oncology

## 2014-05-09 DIAGNOSIS — C159 Malignant neoplasm of esophagus, unspecified: Secondary | ICD-10-CM | POA: Diagnosis not present

## 2014-05-10 ENCOUNTER — Ambulatory Visit: Payer: Medicare Other

## 2014-05-10 ENCOUNTER — Ambulatory Visit
Admission: RE | Admit: 2014-05-10 | Discharge: 2014-05-10 | Disposition: A | Discharge status: Home / Self Care | Payer: Medicare Other | Source: Ambulatory Visit | Attending: Radiation Oncology | Admitting: Radiation Oncology

## 2014-05-10 ENCOUNTER — Encounter: Payer: Self-pay | Admitting: Radiation Oncology

## 2014-05-10 ENCOUNTER — Ambulatory Visit
Admit: 2014-05-10 | Discharge: 2014-05-10 | Disposition: A | Discharge status: Home / Self Care | Payer: Medicare Other | Attending: Radiation Oncology | Admitting: Radiation Oncology

## 2014-05-10 VITALS — BP 118/60 | HR 68 | Temp 98.0°F | Resp 12 | Ht 66.0 in | Wt 139.7 lb

## 2014-05-10 DIAGNOSIS — C159 Malignant neoplasm of esophagus, unspecified: Secondary | ICD-10-CM | POA: Diagnosis not present

## 2014-05-10 NOTE — Progress Notes (Addendum)
Sheila Young has completed 15 fractions to her esophagus.  She denies pain and denies having trouble swallowing.  She continues to eat a soft diet and puts 4 cans of Osmolite through her peg tube.  She has lost 4 lbs since 05/06/14 but states she is eating the same amount.  She continues to have an occasional cough and brings up white sputum. Her peg tube has a small amount of white drainage present.  She will have chemotherapy on Friday.  Her skin is intact in the treatment area. She is using radiaplex gel.  BP 118/60 mmHg  Pulse 68  Temp(Src) 98 F (36.7 C) (Oral)  Resp 12  Ht 5\' 6"  (1.676 m)  Wt 139 lb 11.2 oz (63.368 kg)  BMI 22.56 kg/m2

## 2014-05-10 NOTE — Progress Notes (Signed)
  Radiation Oncology         ([phone] ________________________________  Name: Sheila Young MRN: [id number]  Date: 05/10/2014  DOB: 1950-12-01  Weekly Radiation Therapy Management  Current Dose: 27 Gy     Planned Dose:  50.4 Gy  Narrative . . . . . . . . The patient presents for routine under treatment assessment.                                   The patient is without complaint. She denies any difficulty with swallowing or pain with swallowing at this time. She does eat a soft diet                                Set-up films were reviewed.                                 The chart was checked. Physical Findings. . .  height is 5\' 6"  (1.676 m) and weight is 139 lb 11.2 oz (63.368 kg). Her oral temperature is 98 F (36.7 C). Her blood pressure is 118/60 and her pulse is 68. Her respiration is 12. . The heart has regular rhythm and rate. The lungs show some upper airway noise. Impression . . . . . . . The patient is tolerating radiation. Plan . . . . . . . . . . . . Continue treatment as planned.  ________________________________   Blair Promise, PhD, MD

## 2014-05-11 ENCOUNTER — Ambulatory Visit
Admit: 2014-05-11 | Discharge: 2014-05-11 | Disposition: A | Discharge status: Home / Self Care | Payer: Medicare Other | Attending: Radiation Oncology | Admitting: Radiation Oncology

## 2014-05-11 ENCOUNTER — Ambulatory Visit: Payer: Medicare Other

## 2014-05-11 DIAGNOSIS — C159 Malignant neoplasm of esophagus, unspecified: Secondary | ICD-10-CM | POA: Diagnosis not present

## 2014-05-12 ENCOUNTER — Ambulatory Visit: Payer: Medicare Other

## 2014-05-12 ENCOUNTER — Ambulatory Visit
Admit: 2014-05-12 | Discharge: 2014-05-12 | Disposition: A | Discharge status: Home / Self Care | Payer: Medicare Other | Attending: Radiation Oncology | Admitting: Radiation Oncology

## 2014-05-12 DIAGNOSIS — C159 Malignant neoplasm of esophagus, unspecified: Secondary | ICD-10-CM | POA: Diagnosis not present

## 2014-05-13 ENCOUNTER — Other Ambulatory Visit (HOSPITAL_BASED_OUTPATIENT_CLINIC_OR_DEPARTMENT_OTHER): Payer: Medicare Other | Admitting: Lab

## 2014-05-13 ENCOUNTER — Ambulatory Visit (HOSPITAL_BASED_OUTPATIENT_CLINIC_OR_DEPARTMENT_OTHER): Payer: Medicare Other | Admitting: Family

## 2014-05-13 ENCOUNTER — Ambulatory Visit (HOSPITAL_BASED_OUTPATIENT_CLINIC_OR_DEPARTMENT_OTHER): Payer: Medicare Other

## 2014-05-13 ENCOUNTER — Ambulatory Visit: Payer: Medicare Other

## 2014-05-13 ENCOUNTER — Ambulatory Visit
Admit: 2014-05-13 | Discharge: 2014-05-13 | Disposition: A | Discharge status: Home / Self Care | Payer: Medicare Other | Attending: Radiation Oncology | Admitting: Radiation Oncology

## 2014-05-13 VITALS — BP 128/58 | HR 67 | Temp 97.9°F | Resp 16 | Wt 141.8 lb

## 2014-05-13 DIAGNOSIS — D649 Anemia, unspecified: Secondary | ICD-10-CM

## 2014-05-13 DIAGNOSIS — C159 Malignant neoplasm of esophagus, unspecified: Secondary | ICD-10-CM

## 2014-05-13 DIAGNOSIS — Z5111 Encounter for antineoplastic chemotherapy: Secondary | ICD-10-CM

## 2014-05-13 LAB — CBC WITH DIFFERENTIAL (CANCER CENTER ONLY)
BASO#: 0 10*3/uL (ref 0.0–0.2)
BASO%: 0.4 % (ref 0.0–2.0)
EOS ABS: 0.1 10*3/uL (ref 0.0–0.5)
EOS%: 1.3 % (ref 0.0–7.0)
HEMATOCRIT: 32.3 % — AB (ref 34.8–46.6)
HEMOGLOBIN: 10.8 g/dL — AB (ref 11.6–15.9)
LYMPH#: 0.2 10*3/uL — AB (ref 0.9–3.3)
LYMPH%: 3.2 % — AB (ref 14.0–48.0)
MCH: 28.5 pg (ref 26.0–34.0)
MCHC: 33.4 g/dL (ref 32.0–36.0)
MCV: 85 fL (ref 81–101)
MONO#: 0.4 10*3/uL (ref 0.1–0.9)
MONO%: 5.5 % (ref 0.0–13.0)
NEUT#: 6.2 10*3/uL (ref 1.5–6.5)
NEUT%: 89.6 % — ABNORMAL HIGH (ref 39.6–80.0)
PLATELETS: 184 10*3/uL (ref 145–400)
RBC: 3.79 10*6/uL (ref 3.70–5.32)
RDW: 16.7 % — ABNORMAL HIGH (ref 11.1–15.7)
WBC: 7 10*3/uL (ref 3.9–10.0)

## 2014-05-13 LAB — CMP (CANCER CENTER ONLY)
ALK PHOS: 58 U/L (ref 26–84)
ALT: 12 U/L (ref 10–47)
AST: 21 U/L (ref 11–38)
Albumin: 3 g/dL — ABNORMAL LOW (ref 3.3–5.5)
BUN, Bld: 12 mg/dL (ref 7–22)
CO2: 30 mEq/L (ref 18–33)
Calcium: 9.5 mg/dL (ref 8.0–10.3)
Chloride: 100 mEq/L (ref 98–108)
Creat: 0.4 mg/dl — ABNORMAL LOW (ref 0.6–1.2)
Glucose, Bld: 140 mg/dL — ABNORMAL HIGH (ref 73–118)
Potassium: 2.8 mEq/L — CL (ref 3.3–4.7)
Sodium: 140 mEq/L (ref 128–145)
TOTAL PROTEIN: 6.6 g/dL (ref 6.4–8.1)
Total Bilirubin: 0.5 mg/dl (ref 0.20–1.60)

## 2014-05-13 MED ORDER — PACLITAXEL CHEMO INJECTION 300 MG/50ML
45.0000 mg/m2 | Freq: Once | INTRAVENOUS | Status: AC
  Administered 2014-05-13: 78 mg via INTRAVENOUS
  Filled 2014-05-13: qty 13

## 2014-05-13 MED ORDER — ONDANSETRON 16 MG/50ML IVPB (CHCC)
INTRAVENOUS | Status: AC
  Filled 2014-05-13: qty 16

## 2014-05-13 MED ORDER — SODIUM CHLORIDE 0.9 % IJ SOLN
10.0000 mL | INTRAMUSCULAR | Status: DC | PRN
  Administered 2014-05-13: 10 mL
  Filled 2014-05-13: qty 10

## 2014-05-13 MED ORDER — FAMOTIDINE IN NACL 20-0.9 MG/50ML-% IV SOLN
20.0000 mg | Freq: Once | INTRAVENOUS | Status: AC
Start: 2014-05-13 — End: 2014-05-13
  Administered 2014-05-13: 20 mg via INTRAVENOUS

## 2014-05-13 MED ORDER — HEPARIN SOD (PORK) LOCK FLUSH 100 UNIT/ML IV SOLN
500.0000 [IU] | Freq: Once | INTRAVENOUS | Status: AC | PRN
  Administered 2014-05-13: 500 [IU]
  Filled 2014-05-13: qty 5

## 2014-05-13 MED ORDER — SODIUM CHLORIDE 0.9 % IV SOLN
200.0000 mg | Freq: Once | INTRAVENOUS | Status: AC
  Administered 2014-05-13: 200 mg via INTRAVENOUS
  Filled 2014-05-13: qty 20

## 2014-05-13 MED ORDER — DIPHENHYDRAMINE HCL 50 MG/ML IJ SOLN
50.0000 mg | Freq: Once | INTRAMUSCULAR | Status: AC
  Administered 2014-05-13: 50 mg via INTRAVENOUS

## 2014-05-13 MED ORDER — FAMOTIDINE IN NACL 20-0.9 MG/50ML-% IV SOLN
INTRAVENOUS | Status: AC
  Filled 2014-05-13: qty 50

## 2014-05-13 MED ORDER — DEXAMETHASONE SODIUM PHOSPHATE 20 MG/5ML IJ SOLN
20.0000 mg | Freq: Once | INTRAMUSCULAR | Status: AC
  Administered 2014-05-13: 20 mg via INTRAVENOUS

## 2014-05-13 MED ORDER — DEXAMETHASONE SODIUM PHOSPHATE 20 MG/5ML IJ SOLN
INTRAMUSCULAR | Status: AC
  Filled 2014-05-13: qty 5

## 2014-05-13 MED ORDER — SODIUM CHLORIDE 0.9 % IV SOLN
Freq: Once | INTRAVENOUS | Status: AC
  Administered 2014-05-13: 10:00:00 via INTRAVENOUS

## 2014-05-13 MED ORDER — DIPHENHYDRAMINE HCL 50 MG/ML IJ SOLN
INTRAMUSCULAR | Status: AC
  Filled 2014-05-13: qty 1

## 2014-05-13 MED ORDER — ONDANSETRON 16 MG/50ML IVPB (CHCC)
16.0000 mg | Freq: Once | INTRAVENOUS | Status: AC
  Administered 2014-05-13: 16 mg via INTRAVENOUS

## 2014-05-13 NOTE — Patient Instructions (Signed)
[facility] Discharge Instructions for Patients Receiving Chemotherapy  Today you received the following chemotherapy agents Taxotere, Carboplatin  To help prevent nausea and vomiting after your treatment, we encourage you to take your nausea medication    If you develop nausea and vomiting that is not controlled by your nausea medication, call the clinic.   BELOW ARE SYMPTOMS THAT SHOULD BE REPORTED IMMEDIATELY:  *FEVER GREATER THAN 100.5 F  *CHILLS WITH OR WITHOUT FEVER  NAUSEA AND VOMITING THAT IS NOT CONTROLLED WITH YOUR NAUSEA MEDICATION  *UNUSUAL SHORTNESS OF BREATH  *UNUSUAL BRUISING OR BLEEDING  TENDERNESS IN MOUTH AND THROAT WITH OR WITHOUT PRESENCE OF ULCERS  *URINARY PROBLEMS  *BOWEL PROBLEMS  UNUSUAL RASH Items with * indicate a potential emergency and should be followed up as soon as possible.  Feel free to call the clinic you have any questions or concerns. The clinic phone number is [phone].

## 2014-05-13 NOTE — Progress Notes (Signed)
[place]  [phone] [phone]) [phone]  ID: Sheila Young OB: 1950/04/23 MR#: [id number] FTD#:[id number] Patient Care Team: Beckie Salts, MD as PCP - General (Internal Medicine)  DIAGNOSIS:  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 Dysphagia 3. Malnutrition 4. Anemia 5. Alcoholism   INTERVAL HISTORY: Sheila Young is here today with her neice for follow-up and cycle 4 of treatment. She is feeling much better and smiling today. She has had no problems with swallowing. She had a cheeseburger for dinner yesterday.  She has had no problems with her G-tube. She denies fever, chills, n/v, cough, rash, headache, dizziness, SOB, chest pain, palpitations, abdominal pain, constipation, diarrhea, blood in urine or stool.  No swelling, tenderness, numbness or tingling in her extremities. Emotionally she is doing much better on the Prozac. She is smiling today.  Her appetite is very good and she is drinking lots of water. Her weight is stable at 142 lbs.   CURRENT TREATMENT: Paclitaxel/Carboplatin q7d Radiation   REVIEW OF SYSTEMS: All other 10 point review of systems is negative.   PAST MEDICAL HISTORY: Past Medical History  Diagnosis Date  . Stroke   . Hypercholesteremia   . Hypertension   . Hypokalemia   . History of noncompliance with medical treatment   . High aldosterone   . Anger   . Mammogram abnormal   . Esophagus cancer 04/07/2014  . Mechanical dysphagia 04/07/2014    PAST SURGICAL HISTORY: No past surgical history on file.  FAMILY HISTORY No family history on file.  GYNECOLOGIC HISTORY:  No LMP recorded. Patient is postmenopausal.   SOCIAL HISTORY: History   Social History  . Marital Status: Single    Spouse Name: N/A    Number of Children: N/A  . Years of Education: N/A   Occupational History  . Not on file.   Social History Main Topics  . Smoking status: Current Every Day Smoker -- 0.50 packs/day for 45 years    Types:  Cigarettes    Start date: 06/05/1968  . Smokeless tobacco: Never Used     Comment: 03-2014 still smoking  . Alcohol Use: No  . Drug Use: Not on file  . Sexual Activity: Not Currently   Other Topics Concern  . Not on file   Social History Narrative    ADVANCED DIRECTIVES:  <no information>  HEALTH MAINTENANCE: History  Substance Use Topics  . Smoking status: Current Every Day Smoker -- 0.50 packs/day for 45 years    Types: Cigarettes    Start date: 06/05/1968  . Smokeless tobacco: Never Used     Comment: 03-2014 still smoking  . Alcohol Use: No   Colonoscopy: PAP: Bone density: Lipid panel:  No Known Allergies  Current Outpatient Prescriptions  Medication Sig Dispense Refill  . amLODipine (NORVASC) 10 MG tablet Take 10 mg by mouth daily.    Marland Kitchen atorvastatin (LIPITOR) 40 MG tablet Take 40 mg by mouth every morning.   3  . clopidogrel (PLAVIX) 75 MG tablet Take 75 mg by mouth daily.    Marland Kitchen dexamethasone (DECADRON) 4 MG tablet Take 2 tablets (8 mg total) by mouth 2 (two) times daily with a meal. Start the day after chemotherapy for 3 days. 30 tablet 1  . fexofenadine (ALLEGRA) 180 MG tablet Take 180 mg by mouth.    Marland Kitchen FLUoxetine (PROZAC) 10 MG capsule Take 1 capsule (10 mg total) by mouth daily. 30 capsule 0  . folic acid (FOLVITE) 1 MG tablet Take 1 tablet (  1 mg total) by mouth daily. 30 tablet 4  . hyaluronate sodium (RADIAPLEXRX) GEL Apply 1 application topically 2 (two) times daily. Apply to chest twice a day - after treatment and at bedime.    . lidocaine-prilocaine (EMLA) cream Apply topically once. Apply to port a cath site 1 hour PRIOR to port access 30 g 0  . lisinopril (PRINIVIL,ZESTRIL) 40 MG tablet   3  . LORazepam (ATIVAN) 0.5 MG tablet Take 1 tablet (0.5 mg total) by mouth every 6 (six) hours as needed (nausea,  vomiting). 30 tablet 0  . Nutritional Supplements (OSMOLITE HN PLUS) LIQD Place contents of 1 can into feeding tube 4 times a day. 120 Can 3  .  ondansetron (ZOFRAN) 8 MG tablet Take 1 tablet (8 mg total) by mouth 2 (two) times daily. Start the day after chemo for 3 days. Then take as needed for nausea or vomiting. 30 tablet 1  . oxyCODONE (ROXICODONE INTENSOL) 20 MG/ML concentrated solution Take 0.3-0.5 mLs (6-10 mg total) by mouth every 4 (four) hours as needed for severe pain. (Patient not taking: Reported on 05/10/2014) 30 mL 0  . pantoprazole (PROTONIX) 40 MG tablet Take 1 tablet (40 mg total) by mouth daily. 30 tablet 3  . Potassium Chloride 40 MEQ/15ML (20%) SOLN Take 40 mEq by mouth daily. 15 ml daily 473 mL 0  . prochlorperazine (COMPAZINE) 10 MG tablet Take 1 tablet (10 mg total) by mouth every 6 (six) hours as needed for nausea or vomiting. 60 tablet 3   No current facility-administered medications for this visit.    OBJECTIVE: Filed Vitals:   05/13/14 0935  BP: 128/58  Pulse: 67  Temp: 97.9 F (36.6 C)  Resp: 16    Filed Weights   05/13/14 0935  Weight: 141 lb 12.8 oz (64.32 kg)   ECOG FS:0 - Asymptomatic Ocular: Sclerae unicteric, pupils equal, round and reactive to light Ear-nose-throat: Oropharynx clear, dentition fair Lymphatic: No cervical or supraclavicular adenopathy Lungs no rales or rhonchi, good excursion bilaterally Heart regular rate and rhythm, no murmur appreciated Abd soft, nontender, positive bowel sounds MSK no focal spinal tenderness, no joint edema Neuro: non-focal, well-oriented, appropriate affect Breasts: Deferred  LAB RESULTS: CMP     Component Value Date/Time   NA 140 05/06/2014 0916   NA 137 04/19/2014 0645   K 2.4* 05/06/2014 0916   K 3.2* 04/19/2014 0645   CL 100 05/06/2014 0916   CL 100 04/19/2014 0645   CO2 30 05/06/2014 0916   CO2 28 04/19/2014 0645   GLUCOSE 110 05/06/2014 0916   GLUCOSE 128* 04/19/2014 0645   BUN 10 05/06/2014 0916   BUN 15 04/19/2014 0645   CREATININE 0.5* 05/06/2014 0916   CREATININE 0.63 04/19/2014 0645   CALCIUM 9.6 05/06/2014 0916   CALCIUM 9.8  04/19/2014 0645   PROT 7.2 05/06/2014 0916   PROT 6.3 04/19/2014 0645   ALBUMIN 2.8* 04/19/2014 0645   AST 18 05/06/2014 0916   AST 20 04/19/2014 0645   ALT 11 05/06/2014 0916   ALT 20 04/19/2014 0645   ALKPHOS 68 05/06/2014 0916   ALKPHOS 66 04/19/2014 0645   BILITOT 0.60 05/06/2014 0916   BILITOT 0.7 04/19/2014 0645   GFRNONAA >90 04/19/2014 0645   GFRAA >90 04/19/2014 0645   INo results found for: SPEP, UPEP Lab Results  Component Value Date   WBC 7.0 05/13/2014   NEUTROABS 6.2 05/13/2014   HGB 10.8* 05/13/2014   HCT 32.3* 05/13/2014   MCV 85  05/13/2014   PLT 184 05/13/2014   No results found for: LABCA2 No components found for: [id number] No results for input(s): INR in the last 168 hours. Urinalysis No results found for: COLORURINE, APPEARANCEUR, LABSPEC, PHURINE, GLUCOSEU, HGBUR, BILIRUBINUR, KETONESUR, PROTEINUR, UROBILINOGEN, NITRITE, LEUKOCYTESUR STUDIES:  ASSESSMENT/PLAN: Sheila Young is here today with her neices for follow-up and treatment. She is feeling much better. She has had no problems swallowing so we will advance her to a regular diet.  She is also doing well with her radiation treatments. She finishes these on February 19th.   We will proceed today with cycle 4 of Paclitaxel/Carboplatin today. We will give her a new appointment and treatment schedule today.  She and her nieces know to call here with any questions or concerns and to go to the ED in the event of an emergency. We can certainly see her sooner if need be.   Eliezer Bottom, NP 05/13/2014 10:03 AM

## 2014-05-16 ENCOUNTER — Ambulatory Visit
Admit: 2014-05-16 | Discharge: 2014-05-16 | Disposition: A | Discharge status: Home / Self Care | Payer: Medicare Other | Attending: Radiation Oncology | Admitting: Radiation Oncology

## 2014-05-16 DIAGNOSIS — C159 Malignant neoplasm of esophagus, unspecified: Secondary | ICD-10-CM | POA: Diagnosis not present

## 2014-05-17 ENCOUNTER — Ambulatory Visit
Admit: 2014-05-17 | Discharge: 2014-05-17 | Disposition: A | Discharge status: Home / Self Care | Payer: Medicare Other | Attending: Radiation Oncology | Admitting: Radiation Oncology

## 2014-05-17 ENCOUNTER — Encounter: Payer: Self-pay | Admitting: Radiation Oncology

## 2014-05-17 ENCOUNTER — Ambulatory Visit
Admission: RE | Admit: 2014-05-17 | Discharge: 2014-05-17 | Disposition: A | Discharge status: Home / Self Care | Payer: Medicare Other | Source: Ambulatory Visit | Attending: Radiation Oncology | Admitting: Radiation Oncology

## 2014-05-17 VITALS — BP 125/72 | HR 90 | Temp 99.6°F | Resp 16 | Ht 66.0 in | Wt 140.4 lb

## 2014-05-17 DIAGNOSIS — C159 Malignant neoplasm of esophagus, unspecified: Secondary | ICD-10-CM

## 2014-05-17 NOTE — Progress Notes (Signed)
  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 . . . . . . . The patient presents for routine under treatment assessment.                                   The patient is without complaint. She is eating a regular diet. In addition is placing 4 cans of Osmolite and her PEG tube daily. She denies any pain with swallowing. the patient denies any chills or fever. She continues to have dry cough. She is scheduled for additional chemotherapy later this week.                                 Set-up films were reviewed.                                 The chart was checked. Physical Findings. . .  height is 5\' 6"  (1.676 m) and weight is 140 lb 6.4 oz (63.685 kg). Her oral temperature is 99.6 F (37.6 C). Her blood pressure is 125/72 and her pulse is 90. Her respiration is 16 and oxygen saturation is 95%. . The lungs are clear. The heart has a regular rhythm and rate. The abdomen is soft and nontender with normal bowel sounds. Impression . . . . . . . The patient is tolerating radiation. Plan . . . . . . . . . . . . Continue treatment as planned.  ________________________________   Blair Promise, PhD, MD

## 2014-05-17 NOTE — Progress Notes (Signed)
Sheila Young has completed 20 fractions to her distal esophagus.  She denies pain.  She is eating a regular diet and also putting 4 cans of Osmolite through her peg tube daily.  Her weight is down 1 lb from 05/13/14. She denies any trouble swallowing.  Her chest has hyperpigmentation.  She is using radiaplex gel.  She denies fatigue.  She will have chemotherapy on Friday.  BP 125/72 mmHg  Pulse 90  Temp(Src) 99.6 F (37.6 C) (Oral)  Resp 16  Ht 5\' 6"  (1.676 m)  Wt 140 lb 6.4 oz (63.685 kg)  BMI 22.67 kg/m2  SpO2 95%

## 2014-05-18 ENCOUNTER — Ambulatory Visit
Admit: 2014-05-18 | Discharge: 2014-05-18 | Disposition: A | Discharge status: Home / Self Care | Payer: Medicare Other | Attending: Radiation Oncology | Admitting: Radiation Oncology

## 2014-05-18 DIAGNOSIS — C159 Malignant neoplasm of esophagus, unspecified: Secondary | ICD-10-CM | POA: Diagnosis not present

## 2014-05-19 ENCOUNTER — Ambulatory Visit
Admit: 2014-05-19 | Discharge: 2014-05-19 | Disposition: A | Discharge status: Home / Self Care | Payer: Medicare Other | Attending: Radiation Oncology | Admitting: Radiation Oncology

## 2014-05-19 DIAGNOSIS — C159 Malignant neoplasm of esophagus, unspecified: Secondary | ICD-10-CM | POA: Diagnosis not present

## 2014-05-20 ENCOUNTER — Ambulatory Visit (HOSPITAL_BASED_OUTPATIENT_CLINIC_OR_DEPARTMENT_OTHER): Payer: Medicare Other | Admitting: Family

## 2014-05-20 ENCOUNTER — Ambulatory Visit (HOSPITAL_BASED_OUTPATIENT_CLINIC_OR_DEPARTMENT_OTHER): Payer: Medicare Other

## 2014-05-20 ENCOUNTER — Ambulatory Visit
Admit: 2014-05-20 | Discharge: 2014-05-20 | Disposition: A | Discharge status: Home / Self Care | Payer: Medicare Other | Attending: Radiation Oncology | Admitting: Radiation Oncology

## 2014-05-20 ENCOUNTER — Other Ambulatory Visit (HOSPITAL_BASED_OUTPATIENT_CLINIC_OR_DEPARTMENT_OTHER): Payer: Medicare Other | Admitting: Lab

## 2014-05-20 VITALS — BP 121/54 | HR 72 | Temp 98.0°F | Wt 139.0 lb

## 2014-05-20 DIAGNOSIS — C159 Malignant neoplasm of esophagus, unspecified: Secondary | ICD-10-CM | POA: Diagnosis not present

## 2014-05-20 DIAGNOSIS — E876 Hypokalemia: Secondary | ICD-10-CM

## 2014-05-20 DIAGNOSIS — Z5111 Encounter for antineoplastic chemotherapy: Secondary | ICD-10-CM

## 2014-05-20 LAB — CBC WITH DIFFERENTIAL (CANCER CENTER ONLY)
BASO#: 0 10*3/uL (ref 0.0–0.2)
BASO%: 0.4 % (ref 0.0–2.0)
EOS%: 0.9 % (ref 0.0–7.0)
Eosinophils Absolute: 0 10*3/uL (ref 0.0–0.5)
HCT: 31.3 % — ABNORMAL LOW (ref 34.8–46.6)
HGB: 10.6 g/dL — ABNORMAL LOW (ref 11.6–15.9)
LYMPH#: 0.2 10*3/uL — ABNORMAL LOW (ref 0.9–3.3)
LYMPH%: 3.9 % — ABNORMAL LOW (ref 14.0–48.0)
MCH: 28.8 pg (ref 26.0–34.0)
MCHC: 33.9 g/dL (ref 32.0–36.0)
MCV: 85 fL (ref 81–101)
MONO#: 0.4 10*3/uL (ref 0.1–0.9)
MONO%: 9.5 % (ref 0.0–13.0)
NEUT%: 85.3 % — ABNORMAL HIGH (ref 39.6–80.0)
NEUTROS ABS: 4 10*3/uL (ref 1.5–6.5)
PLATELETS: 119 10*3/uL — AB (ref 145–400)
RBC: 3.68 10*6/uL — AB (ref 3.70–5.32)
RDW: 17.6 % — ABNORMAL HIGH (ref 11.1–15.7)
WBC: 4.6 10*3/uL (ref 3.9–10.0)

## 2014-05-20 LAB — CMP (CANCER CENTER ONLY)
ALT: 20 U/L (ref 10–47)
AST: 20 U/L (ref 11–38)
Albumin: 3.1 g/dL — ABNORMAL LOW (ref 3.3–5.5)
Alkaline Phosphatase: 52 U/L (ref 26–84)
BILIRUBIN TOTAL: 0.6 mg/dL (ref 0.20–1.60)
BUN, Bld: 14 mg/dL (ref 7–22)
CO2: 30 mEq/L (ref 18–33)
Calcium: 9.5 mg/dL (ref 8.0–10.3)
Chloride: 99 mEq/L (ref 98–108)
Creat: 0.8 mg/dl (ref 0.6–1.2)
Glucose, Bld: 130 mg/dL — ABNORMAL HIGH (ref 73–118)
Potassium: 2.7 mEq/L — CL (ref 3.3–4.7)
SODIUM: 139 meq/L (ref 128–145)
Total Protein: 6.8 g/dL (ref 6.4–8.1)

## 2014-05-20 MED ORDER — SODIUM CHLORIDE 0.9 % IJ SOLN
3.0000 mL | INTRAMUSCULAR | Status: DC | PRN
  Filled 2014-05-20: qty 10

## 2014-05-20 MED ORDER — ONDANSETRON 16 MG/50ML IVPB (CHCC)
INTRAVENOUS | Status: AC
  Filled 2014-05-20: qty 16

## 2014-05-20 MED ORDER — SODIUM CHLORIDE 0.9 % IV SOLN
Freq: Once | INTRAVENOUS | Status: AC
  Administered 2014-05-20: 10:00:00 via INTRAVENOUS

## 2014-05-20 MED ORDER — POTASSIUM CHLORIDE CRYS ER 20 MEQ PO TBCR
40.0000 meq | EXTENDED_RELEASE_TABLET | Freq: Once | ORAL | Status: AC
  Administered 2014-05-20: 40 meq via ORAL
  Filled 2014-05-20: qty 2

## 2014-05-20 MED ORDER — SODIUM CHLORIDE 0.9 % IJ SOLN
10.0000 mL | INTRAMUSCULAR | Status: DC | PRN
  Administered 2014-05-20: 10 mL
  Filled 2014-05-20: qty 10

## 2014-05-20 MED ORDER — HEPARIN SOD (PORK) LOCK FLUSH 100 UNIT/ML IV SOLN
500.0000 [IU] | Freq: Once | INTRAVENOUS | Status: AC | PRN
  Administered 2014-05-20: 500 [IU]
  Filled 2014-05-20: qty 5

## 2014-05-20 MED ORDER — HEPARIN SOD (PORK) LOCK FLUSH 100 UNIT/ML IV SOLN
250.0000 [IU] | Freq: Once | INTRAVENOUS | Status: DC | PRN
  Filled 2014-05-20: qty 5

## 2014-05-20 MED ORDER — PACLITAXEL CHEMO INJECTION 300 MG/50ML
45.0000 mg/m2 | Freq: Once | INTRAVENOUS | Status: AC
  Administered 2014-05-20: 78 mg via INTRAVENOUS
  Filled 2014-05-20: qty 13

## 2014-05-20 MED ORDER — ONDANSETRON 16 MG/50ML IVPB (CHCC)
16.0000 mg | Freq: Once | INTRAVENOUS | Status: AC
  Administered 2014-05-20: 16 mg via INTRAVENOUS

## 2014-05-20 MED ORDER — SODIUM CHLORIDE 0.9 % IV SOLN
200.0000 mg | Freq: Once | INTRAVENOUS | Status: AC
  Administered 2014-05-20: 200 mg via INTRAVENOUS
  Filled 2014-05-20: qty 20

## 2014-05-20 MED ORDER — ALTEPLASE 2 MG IJ SOLR
2.0000 mg | Freq: Once | INTRAMUSCULAR | Status: DC | PRN
  Filled 2014-05-20: qty 2

## 2014-05-20 MED ORDER — FAMOTIDINE IN NACL 20-0.9 MG/50ML-% IV SOLN
20.0000 mg | Freq: Once | INTRAVENOUS | Status: AC
  Administered 2014-05-20: 20 mg via INTRAVENOUS

## 2014-05-20 MED ORDER — DEXAMETHASONE SODIUM PHOSPHATE 20 MG/5ML IJ SOLN
INTRAMUSCULAR | Status: AC
Start: 2014-05-20 — End: 2014-05-20
  Filled 2014-05-20: qty 5

## 2014-05-20 MED ORDER — FAMOTIDINE IN NACL 20-0.9 MG/50ML-% IV SOLN
INTRAVENOUS | Status: AC
  Filled 2014-05-20: qty 50

## 2014-05-20 MED ORDER — DIPHENHYDRAMINE HCL 50 MG/ML IJ SOLN
50.0000 mg | Freq: Once | INTRAMUSCULAR | Status: AC
Start: 2014-05-20 — End: 2014-05-20
  Administered 2014-05-20: 50 mg via INTRAVENOUS

## 2014-05-20 MED ORDER — POTASSIUM CHLORIDE CRYS ER 20 MEQ PO TBCR
40.0000 meq | EXTENDED_RELEASE_TABLET | Freq: Two times a day (BID) | ORAL | Status: DC
  Filled 2014-05-20: qty 2

## 2014-05-20 MED ORDER — DEXAMETHASONE SODIUM PHOSPHATE 20 MG/5ML IJ SOLN
20.0000 mg | Freq: Once | INTRAMUSCULAR | Status: AC
  Administered 2014-05-20: 20 mg via INTRAVENOUS

## 2014-05-20 MED ORDER — DIPHENHYDRAMINE HCL 50 MG/ML IJ SOLN
INTRAMUSCULAR | Status: AC
  Filled 2014-05-20: qty 1

## 2014-05-20 NOTE — Patient Instructions (Signed)
Carboplatin injection  What is this medicine?  CARBOPLATIN (KAR boe pla tin) is a chemotherapy drug. It targets fast dividing cells, like cancer cells, and causes these cells to die. This medicine is used to treat ovarian cancer and many other cancers.  This medicine may be used for other purposes; ask your health care provider or pharmacist if you have questions.  COMMON BRAND NAME(S): Paraplatin  What should I tell my health care provider before I take this medicine?  They need to know if you have any of these conditions:  -blood disorders  -hearing problems  -kidney disease  -recent or ongoing radiation therapy  -an unusual or allergic reaction to carboplatin, cisplatin, other chemotherapy, other medicines, foods, dyes, or preservatives  -pregnant or trying to get pregnant  -breast-feeding  How should I use this medicine?  This drug is usually given as an infusion into a vein. It is administered in a hospital or clinic by a specially trained health care professional.  Talk to your pediatrician regarding the use of this medicine in children. Special care may be needed.  Overdosage: If you think you have taken too much of this medicine contact a poison control center or emergency room at once.  NOTE: This medicine is only for you. Do not share this medicine with others.  What if I miss a dose?  It is important not to miss a dose. Call your doctor or health care professional if you are unable to keep an appointment.  What may interact with this medicine?  -medicines for seizures  -medicines to increase blood counts like filgrastim, pegfilgrastim, sargramostim  -some antibiotics like amikacin, gentamicin, neomycin, streptomycin, tobramycin  -vaccines  Talk to your doctor or health care professional before taking any of these medicines:  -acetaminophen  -aspirin  -ibuprofen  -ketoprofen  -naproxen  This list may not describe all possible interactions. Give your health care provider a list of all the medicines, herbs,  non-prescription drugs, or dietary supplements you use. Also tell them if you smoke, drink alcohol, or use illegal drugs. Some items may interact with your medicine.  What should I watch for while using this medicine?  Your condition will be monitored carefully while you are receiving this medicine. You will need important blood work done while you are taking this medicine.  This drug may make you feel generally unwell. This is not uncommon, as chemotherapy can affect healthy cells as well as cancer cells. Report any side effects. Continue your course of treatment even though you feel ill unless your doctor tells you to stop.  In some cases, you may be given additional medicines to help with side effects. Follow all directions for their use.  Call your doctor or health care professional for advice if you get a fever, chills or sore throat, or other symptoms of a cold or flu. Do not treat yourself. This drug decreases your body's ability to fight infections. Try to avoid being around people who are sick.  This medicine may increase your risk to bruise or bleed. Call your doctor or health care professional if you notice any unusual bleeding.  Be careful brushing and flossing your teeth or using a toothpick because you may get an infection or bleed more easily. If you have any dental work done, tell your dentist you are receiving this medicine.  Avoid taking products that contain aspirin, acetaminophen, ibuprofen, naproxen, or ketoprofen unless instructed by your doctor. These medicines may hide a fever.  Do not become   pregnant while taking this medicine. Women should inform their doctor if they wish to become pregnant or think they might be pregnant. There is a potential for serious side effects to an unborn child. Talk to your health care professional or pharmacist for more information. Do not breast-feed an infant while taking this medicine.  What side effects may I notice from receiving this medicine?  Side effects  that you should report to your doctor or health care professional as soon as possible:  -allergic reactions like skin rash, itching or hives, swelling of the face, lips, or tongue  -signs of infection - fever or chills, cough, sore throat, pain or difficulty passing urine  -signs of decreased platelets or bleeding - bruising, pinpoint red spots on the skin, black, tarry stools, nosebleeds  -signs of decreased red blood cells - unusually weak or tired, fainting spells, lightheadedness  -breathing problems  -changes in hearing  -changes in vision  -chest pain  -high blood pressure  -low blood counts - This drug may decrease the number of white blood cells, red blood cells and platelets. You may be at increased risk for infections and bleeding.  -nausea and vomiting  -pain, swelling, redness or irritation at the injection site  -pain, tingling, numbness in the hands or feet  -problems with balance, talking, walking  -trouble passing urine or change in the amount of urine  Side effects that usually do not require medical attention (report to your doctor or health care professional if they continue or are bothersome):  -hair loss  -loss of appetite  -metallic taste in the mouth or changes in taste  This list may not describe all possible side effects. Call your doctor for medical advice about side effects. You may report side effects to FDA at 1-800-FDA-1088.  Where should I keep my medicine?  This drug is given in a hospital or clinic and will not be stored at home.  NOTE: This sheet is a summary. It may not cover all possible information. If you have questions about this medicine, talk to your doctor, pharmacist, or health care provider.   2015, [employer]vier/Gold Standard. (2007-06-30 14:38:05)  Paclitaxel injection  What is this medicine?  PACLITAXEL (PAK li TAX el) is a chemotherapy drug. It targets fast dividing cells, like cancer cells, and causes these cells to die. This medicine is used to treat ovarian cancer,  breast cancer, and other cancers.  This medicine may be used for other purposes; ask your health care provider or pharmacist if you have questions.  COMMON BRAND NAME(S): Onxol, Taxol  What should I tell my health care provider before I take this medicine?  They need to know if you have any of these conditions:  -blood disorders  -irregular heartbeat  -infection (especially a virus infection such as chickenpox, cold sores, or herpes)  -liver disease  -previous or ongoing radiation therapy  -an unusual or allergic reaction to paclitaxel, alcohol, polyoxyethylated castor oil, other chemotherapy agents, other medicines, foods, dyes, or preservatives  -pregnant or trying to get pregnant  -breast-feeding  How should I use this medicine?  This drug is given as an infusion into a vein. It is administered in a hospital or clinic by a specially trained health care professional.  Talk to your pediatrician regarding the use of this medicine in children. Special care may be needed.  Overdosage: If you think you have taken too much of this medicine contact a poison control center or emergency room at once.  NOTE:   This medicine is only for you. Do not share this medicine with others.  What if I miss a dose?  It is important not to miss your dose. Call your doctor or health care professional if you are unable to keep an appointment.  What may interact with this medicine?  Do not take this medicine with any of the following medications:  -disulfiram  -metronidazole  This medicine may also interact with the following medications:  -cyclosporine  -diazepam  -ketoconazole  -medicines to increase blood counts like filgrastim, pegfilgrastim, sargramostim  -other chemotherapy drugs like cisplatin, doxorubicin, epirubicin, etoposide, teniposide, vincristine  -quinidine  -testosterone  -vaccines  -verapamil  Talk to your doctor or health care professional before taking any of these  medicines:  -acetaminophen  -aspirin  -ibuprofen  -ketoprofen  -naproxen  This list may not describe all possible interactions. Give your health care provider a list of all the medicines, herbs, non-prescription drugs, or dietary supplements you use. Also tell them if you smoke, drink alcohol, or use illegal drugs. Some items may interact with your medicine.  What should I watch for while using this medicine?  Your condition will be monitored carefully while you are receiving this medicine. You will need important blood work done while you are taking this medicine.  This drug may make you feel generally unwell. This is not uncommon, as chemotherapy can affect healthy cells as well as cancer cells. Report any side effects. Continue your course of treatment even though you feel ill unless your doctor tells you to stop.  In some cases, you may be given additional medicines to help with side effects. Follow all directions for their use.  Call your doctor or health care professional for advice if you get a fever, chills or sore throat, or other symptoms of a cold or flu. Do not treat yourself. This drug decreases your body's ability to fight infections. Try to avoid being around people who are sick.  This medicine may increase your risk to bruise or bleed. Call your doctor or health care professional if you notice any unusual bleeding.  Be careful brushing and flossing your teeth or using a toothpick because you may get an infection or bleed more easily. If you have any dental work done, tell your dentist you are receiving this medicine.  Avoid taking products that contain aspirin, acetaminophen, ibuprofen, naproxen, or ketoprofen unless instructed by your doctor. These medicines may hide a fever.  Do not become pregnant while taking this medicine. Women should inform their doctor if they wish to become pregnant or think they might be pregnant. There is a potential for serious side effects to an unborn child. Talk to  your health care professional or pharmacist for more information. Do not breast-feed an infant while taking this medicine.  Men are advised not to father a child while receiving this medicine.  What side effects may I notice from receiving this medicine?  Side effects that you should report to your doctor or health care professional as soon as possible:  -allergic reactions like skin rash, itching or hives, swelling of the face, lips, or tongue  -low blood counts - This drug may decrease the number of white blood cells, red blood cells and platelets. You may be at increased risk for infections and bleeding.  -signs of infection - fever or chills, cough, sore throat, pain or difficulty passing urine  -signs of decreased platelets or bleeding - bruising, pinpoint red spots on the skin, black, tarry   stools, nosebleeds  -signs of decreased red blood cells - unusually weak or tired, fainting spells, lightheadedness  -breathing problems  -chest pain  -high or low blood pressure  -mouth sores  -nausea and vomiting  -pain, swelling, redness or irritation at the injection site  -pain, tingling, numbness in the hands or feet  -slow or irregular heartbeat  -swelling of the ankle, feet, hands  Side effects that usually do not require medical attention (report to your doctor or health care professional if they continue or are bothersome):  -bone pain  -complete hair loss including hair on your head, underarms, pubic hair, eyebrows, and eyelashes  -changes in the color of fingernails  -diarrhea  -loosening of the fingernails  -loss of appetite  -muscle or joint pain  -red flush to skin  -sweating  This list may not describe all possible side effects. Call your doctor for medical advice about side effects. You may report side effects to FDA at 1-800-FDA-1088.  Where should I keep my medicine?  This drug is given in a hospital or clinic and will not be stored at home.  NOTE: This sheet is a summary. It may not cover all possible  information. If you have questions about this medicine, talk to your doctor, pharmacist, or health care provider.   2015, [employer] Standard. (2012-05-18 16:41:21)

## 2014-05-20 NOTE — Progress Notes (Signed)
[place]  [phone]) [phone] [phone]) [phone]  ID: Sheila Young OB: [birth year] MR#: [id number] QAS#:[id number] Patient Care Team: Beckie Salts, MD as PCP - General (Internal Medicine)  DIAGNOSIS:  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 Dysphagia 3. Malnutrition 4. Anemia 5. Alcoholism   INTERVAL HISTORY: Sheila Young is here today for follow-up and cycle 5 of treatment. She is still feeling good. She is eating well and not having any problems swallowing or getting choked. Her potassium is low. She has liquid KDUR at home but I am not sure she is taking it. We will give her some today while she is here.   She has had no problems with her G-tube. She denies fever, chills, n/v, cough, rash, headache, dizziness, SOB, chest pain, palpitations, abdominal pain, constipation, diarrhea, blood in urine or stool.  No swelling, tenderness, numbness or tingling in her extremities.  Her appetite is very good and she is staying hydrated. Her weight is stable at 139 lbs.   CURRENT TREATMENT: Paclitaxel/Carboplatin q7d Radiation   REVIEW OF SYSTEMS: All other 10 point review of systems is negative.   PAST MEDICAL HISTORY: Past Medical History  Diagnosis Date  . Stroke   . Hypercholesteremia   . Hypertension   . Hypokalemia   . History of noncompliance with medical treatment   . High aldosterone   . Anger   . Mammogram abnormal   . Esophagus cancer 04/07/2014  . Mechanical dysphagia 04/07/2014    PAST SURGICAL HISTORY: No past surgical history on file.  FAMILY HISTORY No family history on file.  GYNECOLOGIC HISTORY:  No LMP recorded. Patient is postmenopausal.   SOCIAL HISTORY: History   Social History  . Marital Status: Single    Spouse Name: N/A  . Number of Children: N/A  . Years of Education: N/A   Occupational History  . Not on file.   Social History Main Topics  . Smoking status: Current Every Day Smoker -- 0.50 packs/day for 45 years     Types: Cigarettes    Start date: 06/05/1968  . Smokeless tobacco: Never Used     Comment: 03-2014 still smoking  . Alcohol Use: No  . Drug Use: Not on file  . Sexual Activity: Not Currently   Other Topics Concern  . Not on file   Social History Narrative    ADVANCED DIRECTIVES:  <no information>  HEALTH MAINTENANCE: History  Substance Use Topics  . Smoking status: Current Every Day Smoker -- 0.50 packs/day for 45 years    Types: Cigarettes    Start date: 06/05/1968  . Smokeless tobacco: Never Used     Comment: 03-2014 still smoking  . Alcohol Use: No   Colonoscopy: PAP: Bone density: Lipid panel:  No Known Allergies  Current Outpatient Prescriptions  Medication Sig Dispense Refill  . amLODipine (NORVASC) 10 MG tablet Take 10 mg by mouth daily.    Marland Kitchen atorvastatin (LIPITOR) 40 MG tablet Take 40 mg by mouth every morning.   3  . clopidogrel (PLAVIX) 75 MG tablet Take 75 mg by mouth daily.    Marland Kitchen dexamethasone (DECADRON) 4 MG tablet Take 2 tablets (8 mg total) by mouth 2 (two) times daily with a meal. Start the day after chemotherapy for 3 days. 30 tablet 1  . fexofenadine (ALLEGRA) 180 MG tablet Take 180 mg by mouth.    Marland Kitchen FLUoxetine (PROZAC) 10 MG capsule Take 1 capsule (10 mg total) by mouth daily. 30 capsule 0  . folic acid (  FOLVITE) 1 MG tablet Take 1 tablet (1 mg total) by mouth daily. 30 tablet 4  . hyaluronate sodium (RADIAPLEXRX) GEL Apply 1 application topically 2 (two) times daily. Apply to chest twice a day - after treatment and at bedime.    . lidocaine-prilocaine (EMLA) cream Apply topically once. Apply to port a cath site 1 hour PRIOR to port access 30 g 0  . lisinopril (PRINIVIL,ZESTRIL) 40 MG tablet   3  . LORazepam (ATIVAN) 0.5 MG tablet Take 1 tablet (0.5 mg total) by mouth every 6 (six) hours as needed (nausea,  vomiting). 30 tablet 0  . Nutritional Supplements (OSMOLITE HN PLUS) LIQD Place contents of 1 can into feeding tube 4 times a day. 120 Can 3  .  ondansetron (ZOFRAN) 8 MG tablet Take 1 tablet (8 mg total) by mouth 2 (two) times daily. Start the day after chemo for 3 days. Then take as needed for nausea or vomiting. 30 tablet 1  . oxyCODONE (ROXICODONE INTENSOL) 20 MG/ML concentrated solution Take 0.3-0.5 mLs (6-10 mg total) by mouth every 4 (four) hours as needed for severe pain. 30 mL 0  . pantoprazole (PROTONIX) 40 MG tablet Take 1 tablet (40 mg total) by mouth daily. 30 tablet 3  . Potassium Chloride 40 MEQ/15ML (20%) SOLN Take 40 mEq by mouth daily. 15 ml daily 473 mL 0  . prochlorperazine (COMPAZINE) 10 MG tablet Take 1 tablet (10 mg total) by mouth every 6 (six) hours as needed for nausea or vomiting. 60 tablet 3   Current Facility-Administered Medications  Medication Dose Route Frequency Provider Last Rate Last Dose  . potassium chloride SA (K-DUR,KLOR-CON) CR tablet 40 mEq  40 mEq Oral BID Eliezer Bottom, NP       Facility-Administered Medications Ordered in Other Visits  Medication Dose Route Frequency Provider Last Rate Last Dose  . alteplase (CATHFLO ACTIVASE) injection 2 mg  2 mg Intracatheter Once PRN Volanda Napoleon, MD      . CARBOplatin (PARAPLATIN) 200 mg in sodium chloride 0.9 % 100 mL chemo infusion  200 mg Intravenous Once Volanda Napoleon, MD      . famotidine (PEPCID) IVPB 20 mg  20 mg Intravenous Once Volanda Napoleon, MD      . heparin lock flush 100 unit/mL  500 Units Intracatheter Once PRN Volanda Napoleon, MD      . heparin lock flush 100 unit/mL  250 Units Intracatheter Once PRN Volanda Napoleon, MD      . ondansetron (ZOFRAN) IVPB 16 mg  16 mg Intravenous Once Volanda Napoleon, MD   16 mg at 05/20/14 1025  . PACLitaxel (TAXOL) 78 mg in dextrose 5 % 250 mL chemo infusion (</= 80mg /m2)  45 mg/m2 (Treatment Plan Actual) Intravenous Once Volanda Napoleon, MD      . sodium chloride 0.9 % injection 10 mL  10 mL Intracatheter PRN Volanda Napoleon, MD      . sodium chloride 0.9 % injection 3 mL  3 mL Intravenous PRN  Volanda Napoleon, MD        OBJECTIVE: Filed Vitals:   05/20/14 0941  BP: 121/54  Pulse: 72  Temp: 98 F (36.7 C)    Filed Weights   05/20/14 0941  Weight: 139 lb (63.05 kg)   ECOG FS:0 - Asymptomatic Ocular: Sclerae unicteric, pupils equal, round and reactive to light Ear-nose-throat: Oropharynx clear, dentition fair Lymphatic: No cervical or supraclavicular adenopathy Lungs no rales or rhonchi, good  excursion bilaterally Heart regular rate and rhythm, no murmur appreciated Abd soft, nontender, positive bowel sounds MSK no focal spinal tenderness, no joint edema Neuro: non-focal, well-oriented, appropriate affect Breasts: Deferred  LAB RESULTS: CMP     Component Value Date/Time   NA 139 05/20/2014 0916   NA 137 04/19/2014 0645   K 2.7* 05/20/2014 0916   K 3.2* 04/19/2014 0645   CL 99 05/20/2014 0916   CL 100 04/19/2014 0645   CO2 30 05/20/2014 0916   CO2 28 04/19/2014 0645   GLUCOSE 130* 05/20/2014 0916   GLUCOSE 128* 04/19/2014 0645   BUN 14 05/20/2014 0916   BUN 15 04/19/2014 0645   CREATININE 0.8 05/20/2014 0916   CREATININE 0.63 04/19/2014 0645   CALCIUM 9.5 05/20/2014 0916   CALCIUM 9.8 04/19/2014 0645   PROT 6.8 05/20/2014 0916   PROT 6.3 04/19/2014 0645   ALBUMIN 2.8* 04/19/2014 0645   AST 20 05/20/2014 0916   AST 20 04/19/2014 0645   ALT 20 05/20/2014 0916   ALT 20 04/19/2014 0645   ALKPHOS 52 05/20/2014 0916   ALKPHOS 66 04/19/2014 0645   BILITOT 0.60 05/20/2014 0916   BILITOT 0.7 04/19/2014 0645   GFRNONAA >90 04/19/2014 0645   GFRAA >90 04/19/2014 0645   INo results found for: SPEP, UPEP Lab Results  Component Value Date   WBC 4.6 05/20/2014   NEUTROABS 4.0 05/20/2014   HGB 10.6* 05/20/2014   HCT 31.3* 05/20/2014   MCV 85 05/20/2014   PLT 119* 05/20/2014   No results found for: LABCA2 No components found for: [id number] No results for input(s): INR in the last 168 hours. Urinalysis No results found for: COLORURINE, APPEARANCEUR,  LABSPEC, PHURINE, GLUCOSEU, HGBUR, BILIRUBINUR, KETONESUR, PROTEINUR, UROBILINOGEN, NITRITE, LEUKOCYTESUR STUDIES:  ASSESSMENT/PLAN: Sheila Young is avery pleasant African American female with squamous cell carcinoma of the esophagus. She is feeling much better physically and emotionally. She is eating well and having no difficulty swallowing.  She is still doing well with her radiation treatments. She finishes these on February 19th.   We will proceed today with cycle 5 of Paclitaxel/Carboplatin today. We will give her KDUR 40 meq now and again before she leaves today. I reminded her that she needs to be taking her liquid potassium daily at home.  She has her treatment and appointment schedule.  She and her nieces know to call here with any questions or concerns and to go to the ED in the event of an emergency. We can certainly see her sooner if need be.   Eliezer Bottom, NP 05/20/2014 10:27 AM

## 2014-05-23 ENCOUNTER — Ambulatory Visit
Admission: RE | Admit: 2014-05-23 | Discharge: 2014-05-23 | Disposition: A | Discharge status: Home / Self Care | Payer: Medicare Other | Source: Ambulatory Visit | Attending: Radiation Oncology | Admitting: Radiation Oncology

## 2014-05-23 DIAGNOSIS — C159 Malignant neoplasm of esophagus, unspecified: Secondary | ICD-10-CM | POA: Diagnosis not present

## 2014-05-24 ENCOUNTER — Ambulatory Visit
Admission: RE | Admit: 2014-05-24 | Discharge: 2014-05-24 | Disposition: A | Discharge status: Home / Self Care | Payer: Medicare Other | Source: Ambulatory Visit | Attending: Radiation Oncology | Admitting: Radiation Oncology

## 2014-05-24 ENCOUNTER — Encounter: Payer: Self-pay | Admitting: Radiation Oncology

## 2014-05-24 ENCOUNTER — Ambulatory Visit
Admit: 2014-05-24 | Discharge: 2014-05-24 | Disposition: A | Discharge status: Home / Self Care | Payer: Medicare Other | Attending: Radiation Oncology | Admitting: Radiation Oncology

## 2014-05-24 VITALS — BP 106/57 | HR 90 | Temp 99.3°F | Resp 12 | Ht 66.0 in | Wt 138.8 lb

## 2014-05-24 DIAGNOSIS — C159 Malignant neoplasm of esophagus, unspecified: Secondary | ICD-10-CM

## 2014-05-24 NOTE — Progress Notes (Addendum)
Sheila Young has completed 25 fractions to her distal esophagus.  She denies pain and trouble swallowing.  Her niece reports that she sometimes has trouble swallowing but denies any pain or discomfort.  She does have oxycodone solution if needed.  She is eating a regular diet and is still putting 4 cans of Osmolite through her peg tube per day.  Her weight is down 1 lb from 2/12.  She denies any issues with her peg tube.  She reports her cough is better.  She is still bringing up white sputum.  Her temperature was 99.3 today.  She does not have any skin irritation on her abdomen.  She reports fatigue.  Her next chemotherapy is Friday.  She has been given a one month follow up card.  BP 106/57 mmHg  Pulse 90  Temp(Src) 99.3 F (37.4 C) (Oral)  Resp 12  Ht 5\' 6"  (1.676 m)  Wt 138 lb 12.8 oz (62.959 kg)  BMI 22.41 kg/m2  SpO2 100%  Wt Readings from Last 3 Encounters:  05/20/14 139 lb (63.05 kg)  05/13/14 141 lb 12.8 oz (64.32 kg)  05/10/14 139 lb 11.2 oz (63.368 kg)

## 2014-05-24 NOTE — Progress Notes (Signed)
  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 . . . . . . . The patient presents for routine under treatment assessment.                                   The patient is without complaint. She is scheduled for additional chemotherapy later this week. Patient is taking in a regular diet. She also takes in 4 cans of Osmolite through her PEG.                                 Set-up films were reviewed.                                 The chart was checked. Physical Findings. . .  height is 5\' 6"  (1.676 m) and weight is 138 lb 12.8 oz (62.959 kg). Her oral temperature is 99.3 F (37.4 C). Her blood pressure is 106/57 and her pulse is 90. Her respiration is 12 and oxygen saturation is 100%. . The lungs are clear. The heart has a regular rhythm and rate. The abdomen is soft and nontender with normal bowel sounds. Impression . . . . . . . The patient is tolerating radiation. Plan . . . . . . . . . . . . Continue treatment as planned.  ________________________________   Blair Promise, PhD, MD

## 2014-05-25 ENCOUNTER — Ambulatory Visit
Admit: 2014-05-25 | Discharge: 2014-05-25 | Disposition: A | Discharge status: Home / Self Care | Payer: Medicare Other | Attending: Radiation Oncology | Admitting: Radiation Oncology

## 2014-05-25 ENCOUNTER — Other Ambulatory Visit: Payer: Self-pay | Admitting: Nurse Practitioner

## 2014-05-25 DIAGNOSIS — C159 Malignant neoplasm of esophagus, unspecified: Secondary | ICD-10-CM

## 2014-05-25 MED ORDER — FLUOXETINE HCL 10 MG PO CAPS: 10.0000 mg | ORAL_CAPSULE | Freq: Every day | ORAL | Status: DC

## 2014-05-26 ENCOUNTER — Ambulatory Visit
Admit: 2014-05-26 | Discharge: 2014-05-26 | Disposition: A | Discharge status: Home / Self Care | Payer: Medicare Other | Attending: Radiation Oncology | Admitting: Radiation Oncology

## 2014-05-26 DIAGNOSIS — C159 Malignant neoplasm of esophagus, unspecified: Secondary | ICD-10-CM | POA: Diagnosis not present

## 2014-05-27 ENCOUNTER — Telehealth: Payer: Self-pay | Admitting: Hematology & Oncology

## 2014-05-27 ENCOUNTER — Other Ambulatory Visit: Payer: Self-pay | Admitting: Oncology

## 2014-05-27 ENCOUNTER — Other Ambulatory Visit (HOSPITAL_BASED_OUTPATIENT_CLINIC_OR_DEPARTMENT_OTHER): Payer: Medicare Other | Admitting: Lab

## 2014-05-27 ENCOUNTER — Ambulatory Visit (HOSPITAL_BASED_OUTPATIENT_CLINIC_OR_DEPARTMENT_OTHER): Payer: Medicare Other

## 2014-05-27 ENCOUNTER — Encounter: Payer: Self-pay | Admitting: *Deleted

## 2014-05-27 ENCOUNTER — Encounter: Payer: Self-pay | Admitting: Radiation Oncology

## 2014-05-27 ENCOUNTER — Encounter: Payer: Self-pay | Admitting: Hematology & Oncology

## 2014-05-27 ENCOUNTER — Ambulatory Visit (HOSPITAL_BASED_OUTPATIENT_CLINIC_OR_DEPARTMENT_OTHER): Payer: Medicare Other | Admitting: Hematology & Oncology

## 2014-05-27 ENCOUNTER — Ambulatory Visit
Admission: RE | Admit: 2014-05-27 | Discharge: 2014-05-27 | Disposition: A | Discharge status: Home / Self Care | Payer: Medicare Other | Source: Ambulatory Visit | Attending: Radiation Oncology | Admitting: Radiation Oncology

## 2014-05-27 VITALS — BP 85/59 | HR 101 | Temp 98.1°F | Resp 16 | Ht 66.0 in | Wt 137.0 lb

## 2014-05-27 VITALS — BP 106/93 | HR 90 | Temp 98.3°F | Resp 16 | Ht 66.0 in | Wt 136.8 lb

## 2014-05-27 DIAGNOSIS — C159 Malignant neoplasm of esophagus, unspecified: Secondary | ICD-10-CM

## 2014-05-27 DIAGNOSIS — E876 Hypokalemia: Secondary | ICD-10-CM

## 2014-05-27 DIAGNOSIS — Z5111 Encounter for antineoplastic chemotherapy: Secondary | ICD-10-CM

## 2014-05-27 LAB — CMP (CANCER CENTER ONLY)
ALBUMIN: 2.9 g/dL — AB (ref 3.3–5.5)
ALK PHOS: 51 U/L (ref 26–84)
ALT(SGPT): 14 U/L (ref 10–47)
AST: 16 U/L (ref 11–38)
BUN: 20 mg/dL (ref 7–22)
CO2: 32 mEq/L (ref 18–33)
Calcium: 10.1 mg/dL (ref 8.0–10.3)
Chloride: 96 mEq/L — ABNORMAL LOW (ref 98–108)
Creat: 0.9 mg/dl (ref 0.6–1.2)
Glucose, Bld: 157 mg/dL — ABNORMAL HIGH (ref 73–118)
Potassium: 2.8 mEq/L — CL (ref 3.3–4.7)
Sodium: 142 mEq/L (ref 128–145)
Total Bilirubin: 0.6 mg/dl (ref 0.20–1.60)
Total Protein: 6.7 g/dL (ref 6.4–8.1)

## 2014-05-27 LAB — CBC WITH DIFFERENTIAL (CANCER CENTER ONLY)
BASO#: 0 10*3/uL (ref 0.0–0.2)
BASO%: 0.8 % (ref 0.0–2.0)
EOS%: 0.5 % (ref 0.0–7.0)
Eosinophils Absolute: 0 10*3/uL (ref 0.0–0.5)
HEMATOCRIT: 30.1 % — AB (ref 34.8–46.6)
HGB: 10.2 g/dL — ABNORMAL LOW (ref 11.6–15.9)
LYMPH#: 0.2 10*3/uL — ABNORMAL LOW (ref 0.9–3.3)
LYMPH%: 3.8 % — ABNORMAL LOW (ref 14.0–48.0)
MCH: 29.1 pg (ref 26.0–34.0)
MCHC: 33.9 g/dL (ref 32.0–36.0)
MCV: 86 fL (ref 81–101)
MONO#: 0.4 10*3/uL (ref 0.1–0.9)
MONO%: 11 % (ref 0.0–13.0)
NEUT#: 3.4 10*3/uL (ref 1.5–6.5)
NEUT%: 83.9 % — ABNORMAL HIGH (ref 39.6–80.0)
Platelets: 158 10*3/uL (ref 145–400)
RBC: 3.5 10*6/uL — ABNORMAL LOW (ref 3.70–5.32)
RDW: 18.4 % — AB (ref 11.1–15.7)
WBC: 4 10*3/uL (ref 3.9–10.0)

## 2014-05-27 MED ORDER — HEPARIN SOD (PORK) LOCK FLUSH 100 UNIT/ML IV SOLN
500.0000 [IU] | Freq: Once | INTRAVENOUS | Status: AC | PRN
  Administered 2014-05-27: 500 [IU]
  Filled 2014-05-27: qty 5

## 2014-05-27 MED ORDER — AMLODIPINE BESYLATE 10 MG PO TABS: 5.0000 mg | ORAL_TABLET | Freq: Every day | ORAL | Status: DC

## 2014-05-27 MED ORDER — FAMOTIDINE IN NACL 20-0.9 MG/50ML-% IV SOLN
20.0000 mg | Freq: Once | INTRAVENOUS | Status: AC
  Administered 2014-05-27: 20 mg via INTRAVENOUS

## 2014-05-27 MED ORDER — POTASSIUM CHLORIDE CRYS ER 20 MEQ PO TBCR
40.0000 meq | EXTENDED_RELEASE_TABLET | Freq: Once | ORAL | Status: DC
  Filled 2014-05-27: qty 2

## 2014-05-27 MED ORDER — PACLITAXEL CHEMO INJECTION 300 MG/50ML
45.0000 mg/m2 | Freq: Once | INTRAVENOUS | Status: AC
  Administered 2014-05-27: 78 mg via INTRAVENOUS
  Filled 2014-05-27: qty 13

## 2014-05-27 MED ORDER — ONDANSETRON 16 MG/50ML IVPB (CHCC)
16.0000 mg | Freq: Once | INTRAVENOUS | Status: AC
  Administered 2014-05-27: 16 mg via INTRAVENOUS

## 2014-05-27 MED ORDER — DEXAMETHASONE SODIUM PHOSPHATE 20 MG/5ML IJ SOLN
INTRAMUSCULAR | Status: AC
  Filled 2014-05-27: qty 5

## 2014-05-27 MED ORDER — SODIUM CHLORIDE 0.9 % IJ SOLN
10.0000 mL | INTRAMUSCULAR | Status: DC | PRN
  Administered 2014-05-27: 10 mL
  Filled 2014-05-27: qty 10

## 2014-05-27 MED ORDER — SODIUM CHLORIDE 0.9 % IV SOLN
200.0000 mg | Freq: Once | INTRAVENOUS | Status: AC
  Administered 2014-05-27: 200 mg via INTRAVENOUS
  Filled 2014-05-27: qty 20

## 2014-05-27 MED ORDER — FAMOTIDINE IN NACL 20-0.9 MG/50ML-% IV SOLN
INTRAVENOUS | Status: AC
  Filled 2014-05-27: qty 50

## 2014-05-27 MED ORDER — SODIUM CHLORIDE 0.9 % IV SOLN
Freq: Once | INTRAVENOUS | Status: AC
  Administered 2014-05-27: 10:00:00 via INTRAVENOUS

## 2014-05-27 MED ORDER — POTASSIUM CHLORIDE CRYS ER 20 MEQ PO TBCR
40.0000 meq | EXTENDED_RELEASE_TABLET | Freq: Once | ORAL | Status: AC
  Administered 2014-05-27: 40 meq via ORAL
  Filled 2014-05-27: qty 2

## 2014-05-27 MED ORDER — DIPHENHYDRAMINE HCL 50 MG/ML IJ SOLN
INTRAMUSCULAR | Status: AC
  Filled 2014-05-27: qty 1

## 2014-05-27 MED ORDER — ONDANSETRON 16 MG/50ML IVPB (CHCC)
INTRAVENOUS | Status: AC
  Filled 2014-05-27: qty 16

## 2014-05-27 MED ORDER — DEXAMETHASONE SODIUM PHOSPHATE 20 MG/5ML IJ SOLN
20.0000 mg | Freq: Once | INTRAMUSCULAR | Status: AC
  Administered 2014-05-27: 20 mg via INTRAVENOUS

## 2014-05-27 MED ORDER — DIPHENHYDRAMINE HCL 50 MG/ML IJ SOLN
50.0000 mg | Freq: Once | INTRAMUSCULAR | Status: AC
Start: 2014-05-27 — End: 2014-05-27
  Administered 2014-05-27: 50 mg via INTRAVENOUS

## 2014-05-27 NOTE — Patient Instructions (Signed)
[facility] Discharge Instructions for Patients Receiving Chemotherapy  Today you received the following chemotherapy agents  Taxol and Carboplatin.  To help prevent nausea and vomiting after your treatment, we encourage you to take your nausea medication as prescribed.   Please STOP taking the lisinopril and DECREASE her Norvasc dose to 5mg  per day.    If you develop nausea and vomiting that is not controlled by your nausea medication, call the clinic.   BELOW ARE SYMPTOMS THAT SHOULD BE REPORTED IMMEDIATELY:  *FEVER GREATER THAN 100.5 F  *CHILLS WITH OR WITHOUT FEVER  NAUSEA AND VOMITING THAT IS NOT CONTROLLED WITH YOUR NAUSEA MEDICATION  *UNUSUAL SHORTNESS OF BREATH  *UNUSUAL BRUISING OR BLEEDING  TENDERNESS IN MOUTH AND THROAT WITH OR WITHOUT PRESENCE OF ULCERS  *URINARY PROBLEMS  *BOWEL PROBLEMS  UNUSUAL RASH Items with * indicate a potential emergency and should be followed up as soon as possible.  Feel free to call the clinic you have any questions or concerns. The clinic phone number is [phone].

## 2014-05-27 NOTE — Progress Notes (Signed)
  Radiation Oncology         ([phone] ________________________________  Name: Sheila Young MRN: [id number]  Date: 05/27/2014  DOB: 29-Nov-1950  Weekly Radiation Therapy Management    ICD-9-CM ICD-10-CM   1. Esophagus cancer 150.9 C15.9     Current Dose: 54 Gy     Planned Dose:  54 Gy  Narrative . . . . . . . . The patient presents for routine under treatment assessment.                                   Sheila Young has completed treatment with 28 fractions to her distal esophagus.  She denies pain and trouble swallowing.  Her niece reports that she has noticed that Sheila Young will take a few bites of food and then stop eating.  She is wondering if she is having pain with swallowing.  She is also taking in 4 cans of osmolite through her feeding tube daily.  Her peg tube is intact.  She has lost 1.5 lbs since Tuesday.  She continues to have an occasional cough.  She had her last chemotherapy today.  She denies fatigue.  Her skin is intact on her chest.                                   Set-up films were reviewed.                                 The chart was checked. Physical Findings. . .  height is 5\' 6"  (1.676 m) and weight is 136 lb 12.8 oz (62.052 kg). Her oral temperature is 98.3 F (36.8 C). Her blood pressure is 106/93 and her pulse is 90. Her respiration is 16 and oxygen saturation is 99%. . Weight essentially stable.  No significant changes. Impression . . . . . . . The patient is tolerating radiation. Plan . . . . . . . . . . . . Complete treatment as planned.  ________________________________  Sheral Apley. Tammi Klippel, M.D.

## 2014-05-27 NOTE — Progress Notes (Signed)
Dr. Marin Olp notified of K+ 2.8. No orders received. GGilley.  Patient given 40 meq of KDur.  Patient able to take with ice cream.

## 2014-05-27 NOTE — Progress Notes (Signed)
Sheila Young has completed treatment with 28 fractions to her distal esophagus.  She denies pain and trouble swallowing.  Her niece reports that she has noticed that Sheila Young will take a few bites of food and then stop eating.  She is wondering if she is having pain with swallowing.  She is also taking in 4 cans of osmolite through her feeding tube daily.  Her peg tube is intact.  She has lost 1.5 lbs since Tuesday.  She continues to have an occasional cough.  She had her last chemotherapy today.  She denies fatigue.  Her skin is intact on her chest.    BP 106/93 mmHg  Pulse 90  Temp(Src) 98.3 F (36.8 C) (Oral)  Resp 16  Ht 5\' 6"  (1.676 m)  Wt 137 lb (62.143 kg)  BMI 22.12 kg/m2  SpO2 99%

## 2014-05-27 NOTE — Telephone Encounter (Signed)
Per Peggy in scheduling Pt does not need to drink contrast for CT since she is getting PET first

## 2014-05-27 NOTE — Progress Notes (Signed)
Hematology and Oncology Follow Up Visit  Sheila Young [id number] [birth year] 64 y.o. 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  Young is is back for follow-up. She actually looks quite well. She has a feeding tube in place.  We first saw her, she was admitted. We did get a feeding tube put into her.  She has locally advanced disease.  She's been getting weekly chemotherapy. She has been on weekly Taxol/carboplatin. This be her sixth week. She'll finish up her radiation today.  She's not hurting. She's had no positive bowels or bladder. She's had no rashes. She's had no bleeding. She's had no leg swelling. Her potassium has been on the low side. I am not sure if she is taking potassium at home or not.  For now, her performance status is ECOG 1.  Medications:  Current outpatient prescriptions:  .  amLODipine (NORVASC) 10 MG tablet, Take 10 mg by mouth daily., Disp: , Rfl:  .  atorvastatin (LIPITOR) 40 MG tablet, Take 40 mg by mouth every morning. , Disp: , Rfl: 3 .  clopidogrel (PLAVIX) 75 MG tablet, Take 75 mg by mouth daily., Disp: , Rfl:  .  dexamethasone (DECADRON) 4 MG tablet, Take 2 tablets (8 mg total) by mouth 2 (two) times daily with a meal. Start the day after chemotherapy for 3 days., Disp: 30 tablet, Rfl: 1 .  fexofenadine (ALLEGRA) 180 MG tablet, Take 180 mg by mouth., Disp: , Rfl:  .  FLUoxetine (PROZAC) 10 MG capsule, Take 1 capsule (10 mg total) by mouth daily., Disp: 30 capsule, Rfl: 4 .  folic acid (FOLVITE) 1 MG tablet, Take 1 tablet (1 mg total) by mouth daily., Disp: 30 tablet, Rfl: 4 .  lidocaine-prilocaine (EMLA) cream, Apply topically once. Apply to port a cath site 1 hour PRIOR to port access, Disp: 30 g, Rfl: 0 .  lisinopril (PRINIVIL,ZESTRIL) 40 MG tablet, Take 40 mg by mouth daily. , Disp: , Rfl: 3 .   LORazepam (ATIVAN) 0.5 MG tablet, Take 1 tablet (0.5 mg total) by mouth every 6 (six) hours as needed (nausea,  vomiting)., Disp: 30 tablet, Rfl: 0 .  NON FORMULARY, Pt niece states, she is taking all her meds daily., Disp: , Rfl:  .  Nutritional Supplements (OSMOLITE HN PLUS) LIQD, Place contents of 1 can into feeding tube 4 times a day., Disp: 120 Can, Rfl: 3 .  ondansetron (ZOFRAN) 8 MG tablet, Take 1 tablet (8 mg total) by mouth 2 (two) times daily. Start the day after chemo for 3 days. Then take as needed for nausea or vomiting., Disp: 30 tablet, Rfl: 1 .  pantoprazole (PROTONIX) 40 MG tablet, Take 1 tablet (40 mg total) by mouth daily., Disp: 30 tablet, Rfl: 3 .  Potassium Chloride 40 MEQ/15ML (20%) SOLN, Take 40 mEq by mouth daily. 15 ml daily, Disp: 473 mL, Rfl: 0 .  prochlorperazine (COMPAZINE) 10 MG tablet, Take 1 tablet (10 mg total) by mouth every 6 (six) hours as needed for nausea or vomiting., Disp: 60 tablet, Rfl: 3 .  hyaluronate sodium (RADIAPLEXRX) GEL, Apply 1 application topically 2 (two) times daily. Apply to chest twice a day - after treatment and at bedime., Disp: , Rfl:  .  oxyCODONE (ROXICODONE INTENSOL) 20 MG/ML concentrated solution, Take 0.3-0.5 mLs (6-10 mg total) by mouth every 4 (four) hours  as needed for severe pain. (Patient not taking: Reported on 05/24/2014), Disp: 30 mL, Rfl: 0 No current facility-administered medications for this visit.  Facility-Administered Medications Ordered in Other Visits:  .  CARBOplatin (PARAPLATIN) 200 mg in sodium chloride 0.9 % 100 mL chemo infusion, 200 mg, Intravenous, Once, Volanda Napoleon, MD .  famotidine (PEPCID) IVPB 20 mg, 20 mg, Intravenous, Once, Volanda Napoleon, MD .  heparin lock flush 100 unit/mL, 500 Units, Intracatheter, Once PRN, Volanda Napoleon, MD .  ondansetron (ZOFRAN) IVPB 16 mg, 16 mg, Intravenous, Once, Volanda Napoleon, MD, 16 mg at 05/27/14 1011 .  PACLitaxel (TAXOL) 78 mg in dextrose 5 % 250 mL chemo infusion  (</= 80mg /m2), 45 mg/m2 (Treatment Plan Actual), Intravenous, Once, Volanda Napoleon, MD .  sodium chloride 0.9 % injection 10 mL, 10 mL, Intracatheter, PRN, Volanda Napoleon, MD  Allergies: No Known Allergies  Past Medical History, Surgical history, Social history, and Family History were reviewed and updated.  Review of Systems: As above  Physical Exam:  height is 5\' 6"  (1.676 m) and weight is 137 lb (62.143 kg). Her oral temperature is 98.1 F (36.7 C). Her blood pressure is 85/59 and her pulse is 101. Her respiration is 16.   Fairly well-developed and well-nourished African American female. Head and neck exam shows no ocular or oral lesions. There are no palpable cervical or supraclavicular lymph nodes. Lungs are clear. Cardiac exam regular rate and rhythm with no murmurs, rubs or bruits. Abdomen is soft. Shows a feeding tube in place. She has no fluid wave. There is no guarding or rebound tenderness. Is no palpable liver or spleen tip. Back exam shows no tenderness over the spine, ribs or hips. Extremities shows no clubbing, cyanosis or edema. She has good range of motion of her joints patient has good strength. Neurological exam shows no focal neurological deficits.  Lab Results  Component Value Date   WBC 4.0 05/27/2014   HGB 10.2* 05/27/2014   HCT 30.1* 05/27/2014   MCV 86 05/27/2014   PLT 158 05/27/2014     Chemistry      Component Value Date/Time   NA 142 05/27/2014 0859   NA 137 04/19/2014 0645   K 2.8* 05/27/2014 0859   K 3.2* 04/19/2014 0645   CL 96* 05/27/2014 0859   CL 100 04/19/2014 0645   CO2 32 05/27/2014 0859   CO2 28 04/19/2014 0645   BUN 20 05/27/2014 0859   BUN 15 04/19/2014 0645   CREATININE 0.9 05/27/2014 0859   CREATININE 0.63 04/19/2014 0645      Component Value Date/Time   CALCIUM 10.1 05/27/2014 0859   CALCIUM 9.8 04/19/2014 0645   ALKPHOS 51 05/27/2014 0859   ALKPHOS 66 04/19/2014 0645   AST 16 05/27/2014 0859   AST 20 04/19/2014 0645   ALT 14  05/27/2014 0859   ALT 20 04/19/2014 0645   BILITOT 0.60 05/27/2014 0859   BILITOT 0.7 04/19/2014 0645         Impression and Plan: Sheila Young is 64 year old African-American female. Shows locally advanced squamous cell carcinoma of the esophagus.  We will go ahead and complete her therapy.  I will wait about 6 weeks before we repeat her scans.  She also will need to have another upper endoscopy. Will have to arrange for this as an outpatient.  I spent about 30 minutes with she and her niece.  I told her niece to stop her lisinopril and to cut her  Norvasc dose in half. Her blood pressures is on the low side.  We will give her potassium in the office today. She give potassium now and then she will get some and she leaves.  We will set her up with her scans.  I will see her back after her scans are done.  I think the next step would be to see if she needed any chemosurgery but hopefully with radiation chemotherapy, she will be cured.   Volanda Napoleon, MD 2/19/201610:20 AM

## 2014-05-27 NOTE — Progress Notes (Signed)
[place] Work  Clinical Social Work was referred by patient's niece, Sheila Young who runs family care home where pt is residing during treatment. Per Ms. Battles message, she needed Pasrr submitted for pt. CSW attempted to access [place] [facility] system to see if pt had previous Pasrr, but CSW unable to log in. CSW account deactivated due to no recent use.   CSW spoke to niece who stated that the State had notified her Pasrr is not needed for pt at this time and they would assist with this process in the future. CSW explained to niece how this CSW pasrr account was deactivated, but is working to reinstate. CSW also explained CSW would need to see pt in order to submit. Last appt for pt at [facility] is today. Niece again felt no need for Pasrr at this time. Reports pt is doing well and getting good care. Niece aware to contact CSW as needed.   CSW contacted CSW asst director for guidance in re-activating [place] [employer] account.  Sheila Young, [place] Worker [place]  [place] Phone: [phone] Fax: [phone]

## 2014-06-08 ENCOUNTER — Telehealth: Payer: Self-pay | Admitting: Oncology

## 2014-06-08 NOTE — Telephone Encounter (Signed)
Sheila Young (patient's niece on [place] list) left a message asking about Sheila Young's TB test to verify the dates.  Called her back and left a message advising her the test was administered on 04/18/14 and was read on 04/20/14.

## 2014-06-14 ENCOUNTER — Telehealth: Payer: Self-pay | Admitting: *Deleted

## 2014-06-14 NOTE — Telephone Encounter (Signed)
Family member had many questions about nutritional needs regarding tube feeds. Reviewed the January hospital notes of nutritionist which outlined plan for home tube feeds. She appreciated the info.

## 2014-06-30 ENCOUNTER — Encounter: Payer: Self-pay | Admitting: Oncology

## 2014-07-01 ENCOUNTER — Ambulatory Visit (HOSPITAL_COMMUNITY)
Admission: RE | Admit: 2014-07-01 | Discharge: 2014-07-01 | Disposition: A | Discharge status: Home / Self Care | Payer: Medicare Other | Source: Ambulatory Visit | Attending: Hematology & Oncology | Admitting: Hematology & Oncology

## 2014-07-01 ENCOUNTER — Encounter (HOSPITAL_COMMUNITY)
Admission: RE | Admit: 2014-07-01 | Discharge: 2014-07-01 | Disposition: A | Discharge status: Home / Self Care | Payer: Medicare Other | Source: Ambulatory Visit | Attending: Hematology & Oncology | Admitting: Hematology & Oncology

## 2014-07-01 ENCOUNTER — Encounter (HOSPITAL_COMMUNITY): Payer: Self-pay

## 2014-07-01 ENCOUNTER — Other Ambulatory Visit: Payer: Self-pay | Admitting: *Deleted

## 2014-07-01 DIAGNOSIS — K769 Liver disease, unspecified: Secondary | ICD-10-CM | POA: Insufficient documentation

## 2014-07-01 DIAGNOSIS — Z9221 Personal history of antineoplastic chemotherapy: Secondary | ICD-10-CM | POA: Diagnosis not present

## 2014-07-01 DIAGNOSIS — C7972 Secondary malignant neoplasm of left adrenal gland: Secondary | ICD-10-CM | POA: Insufficient documentation

## 2014-07-01 DIAGNOSIS — C159 Malignant neoplasm of esophagus, unspecified: Secondary | ICD-10-CM

## 2014-07-01 DIAGNOSIS — Z923 Personal history of irradiation: Secondary | ICD-10-CM | POA: Diagnosis not present

## 2014-07-01 LAB — GLUCOSE, CAPILLARY: GLUCOSE-CAPILLARY: 113 mg/dL — AB (ref 70–99)

## 2014-07-01 MED ORDER — POTASSIUM CHLORIDE 40 MEQ/15ML (20%) PO SOLN: 40.0000 meq | Freq: Every day | ORAL | Status: DC

## 2014-07-01 MED ORDER — IOHEXOL 300 MG/ML  SOLN
100.0000 mL | Freq: Once | INTRAMUSCULAR | Status: AC | PRN
  Administered 2014-07-01: 80 mL via INTRAVENOUS

## 2014-07-01 MED ORDER — FLUDEOXYGLUCOSE F - 18 (FDG) INJECTION: 6.6000 | Freq: Once | INTRAVENOUS | Status: AC | PRN

## 2014-07-04 ENCOUNTER — Ambulatory Visit
Admission: RE | Admit: 2014-07-04 | Discharge: 2014-07-04 | Disposition: A | Discharge status: Home / Self Care | Payer: Medicare Other | Source: Ambulatory Visit | Attending: Radiation Oncology | Admitting: Radiation Oncology

## 2014-07-04 ENCOUNTER — Encounter: Payer: Self-pay | Admitting: Radiation Oncology

## 2014-07-04 VITALS — BP 150/72 | HR 94 | Temp 98.2°F | Resp 16 | Wt 126.0 lb

## 2014-07-04 DIAGNOSIS — C159 Malignant neoplasm of esophagus, unspecified: Secondary | ICD-10-CM

## 2014-07-04 NOTE — Progress Notes (Signed)
Radiation Oncology         [phone] ________________________________  Name: Sheila Young MRN: [id number]  Date: 07/04/2014  DOB: [birth year]  Follow-Up Visit Note  CC: Beckie Salts, MD  Volanda Napoleon, MD    ICD-9-CM ICD-10-CM   1. Esophagus cancer 150.9 C15.9     Diagnosis:  Advanced squamous cell carcinoma of the distal esophagus   Interval Since Last Radiation:  5  weeks  Narrative:  The patient returns today for routine follow-up.  According to family members she had just good and bad days. She overall seems to have had improvement in her fatigue. Patient can take in soft foods such as puddings and applesauce but no solid foods. She is taking an approximately 4 cans of nutritional supplement per day. Patient has lost some weight despite this and have recommended the family that she increase her supplements to at least 5 cans per day. She denies any pain with swallowing. Patient did undergo a scan on March 25 which showed interval decrease in size of the the esophageal lesion. There were no new problems noted on the PET scan.                              ALLERGIES:  has No Known Allergies.  Meds: Current Outpatient Prescriptions  Medication Sig Dispense Refill  . amLODipine (NORVASC) 10 MG tablet Take 0.5 tablets (5 mg total) by mouth daily. 30 tablet 3  . atorvastatin (LIPITOR) 40 MG tablet Take 40 mg by mouth every morning.   3  . clopidogrel (PLAVIX) 75 MG tablet Take 75 mg by mouth daily.    Marland Kitchen dexamethasone (DECADRON) 4 MG tablet Take 2 tablets (8 mg total) by mouth 2 (two) times daily with a meal. Start the day after chemotherapy for 3 days. 30 tablet 1  . fexofenadine (ALLEGRA) 180 MG tablet Take 180 mg by mouth.    Marland Kitchen FLUoxetine (PROZAC) 10 MG capsule Take 1 capsule (10 mg total) by mouth daily. 30 capsule 4  . folic acid (FOLVITE) 1 MG tablet Take 1 tablet (1 mg total) by mouth daily. 30 tablet 4  . hyaluronate sodium (RADIAPLEXRX) GEL Apply 1 application topically  2 (two) times daily. Apply to chest twice a day - after treatment and at bedime.    . lidocaine-prilocaine (EMLA) cream Apply topically once. Apply to port a cath site 1 hour PRIOR to port access 30 g 0  . lisinopril (PRINIVIL,ZESTRIL) 40 MG tablet Take 40 mg by mouth daily.   3  . LORazepam (ATIVAN) 0.5 MG tablet Take 1 tablet (0.5 mg total) by mouth every 6 (six) hours as needed (nausea,  vomiting). 30 tablet 0  . NON FORMULARY Pt niece states, she is taking all her meds daily.    . Nutritional Supplements (OSMOLITE HN PLUS) LIQD Place contents of 1 can into feeding tube 4 times a day. 120 Can 3  . ondansetron (ZOFRAN) 8 MG tablet Take 1 tablet (8 mg total) by mouth 2 (two) times daily. Start the day after chemo for 3 days. Then take as needed for nausea or vomiting. 30 tablet 1  . pantoprazole (PROTONIX) 40 MG tablet Take 1 tablet (40 mg total) by mouth daily. 30 tablet 3  . Potassium Chloride 40 MEQ/15ML (20%) SOLN Take 40 mEq by mouth daily. 15 ml daily 473 mL 3  . prochlorperazine (COMPAZINE) 10 MG tablet Take 1 tablet (10 mg total) by  mouth every 6 (six) hours as needed for nausea or vomiting. 60 tablet 3  . oxyCODONE (ROXICODONE INTENSOL) 20 MG/ML concentrated solution Take 0.3-0.5 mLs (6-10 mg total) by mouth every 4 (four) hours as needed for severe pain. (Patient not taking: Reported on 05/24/2014) 30 mL 0   No current facility-administered medications for this encounter.    Physical Findings: The patient is in no acute distress. Patient is alert and oriented.  weight is 126 lb (57.153 kg). Her oral temperature is 98.2 F (36.8 C). Her blood pressure is 150/72 and her pulse is 94. Her respiration is 16 and oxygen saturation is 97%. . No palpable subclavicular or axillary adenopathy. The lungs are clear to auscultation. The heart has a regular rhythm and rate. The abdomen is soft and nontender with normal bowel sounds. Lab Findings: Lab Results  Component Value Date   WBC 4.0  05/27/2014   HGB 10.2* 05/27/2014   HCT 30.1* 05/27/2014   MCV 86 05/27/2014   PLT 158 05/27/2014    Radiographic Findings: Ct Chest W Contrast  07/01/2014   CLINICAL DATA:  Subsequent treatment strategy for esophageal carcinoma. Status post radiation and chemotherapy. Initial diagnosis December 2015.  EXAM: CT CHEST, ABDOMEN, AND PELVIS WITH CONTRAST  TECHNIQUE: Multidetector CT imaging of the chest, abdomen and pelvis was performed following the standard protocol during bolus administration of intravenous contrast.  CONTRAST:  44mL OMNIPAQUE IOHEXOL 300 MG/ML  SOLN  COMPARISON:  PET-CT scan 325 1,016, chest CT 04/08/2014, MRI abdomen 04/11/2014  FINDINGS: CT CHEST FINDINGS  Mediastinum/Nodes: No axillary supraclavicular lymphadenopathy. Small a precarinal lymph node measures 6 mm not changed from prior. No pericardial fluid. No central pulmonary embolism.  There is considerable decrease in distal esophageal wall thickening. Previously the esophagus wall measured up to 28 mm and now the greatest thickness is 12 mm.  Lungs/Pleura: There is centrilobular emphysema in the upper lobes. No new or suspicious pulmonary nodules.  CT ABDOMEN AND PELVIS FINDINGS  Hepatobiliary: There is a new ill-defined low-attenuation region within the lateral left hepatic lobe. This corresponds to a hypermetabolic region on comparison PET-CT scan same day. There was no lesion on contrast enhanced CT of 04/08/2014 at this location. This is ill-defined region measure approximately 6.8 x 55 Cm. A smaller discrete hepatic hypodensity measuring 5 mm unchanged consistent with cyst.  Pancreas: Pancreas is normal. No ductal dilatation. No pancreatic inflammation.  Spleen: Normal spleen.  Adrenals/Urinary Tract: Left adrenal gland is enlarged to 2.8 x 2.2 cm not changed from 2.7 x 2.3 cm on prior. This lesion was hypermetabolic on comparison PET-CT scan. Mild thickening of the right adrenal gland is unchanged. Normal kidneys, ureters, and  bladder.  Stomach/Bowel: There is a percutaneous gastrostomy tube in the stomach. The small bowel and colon are unremarkable.  Vascular/Lymphatic: Abdominal aorta is normal caliber. There is no retroperitoneal or periportal lymphadenopathy. No pelvic lymphadenopathy.  Reproductive: Uterus and adnexa are normal.  Musculoskeletal: No aggressive osseous lesion.  IMPRESSION: Chest Impression:  1. Marked reduction in thickness of the distal esophagus. 2. No evidence of pulmonary metastasis.  Abdomen / Pelvis Impression:  1. New ill-defined region of low attenuation within the left lateral hepatic lobe compared to CT of 04/08/2014. This lesion is hypermetabolic on comparison PET-CT and favored to be radiation inflammation rather than liver metastasis although this cannot be completely excluded. 2. Stable left adrenal metastasis. 3. No nodal metastasis in the abdomen pelvis. 4. No skeletal metastasis.   Electronically Signed   By:  Suzy Bouchard M.D.   On: 07/01/2014 13:03   Ct Abdomen Pelvis W Contrast  07/01/2014   CLINICAL DATA:  Subsequent treatment strategy for esophageal carcinoma. Status post radiation and chemotherapy. Initial diagnosis December 2015.  EXAM: CT CHEST, ABDOMEN, AND PELVIS WITH CONTRAST  TECHNIQUE: Multidetector CT imaging of the chest, abdomen and pelvis was performed following the standard protocol during bolus administration of intravenous contrast.  CONTRAST:  56mL OMNIPAQUE IOHEXOL 300 MG/ML  SOLN  COMPARISON:  PET-CT scan 325 1,016, chest CT 04/08/2014, MRI abdomen 04/11/2014  FINDINGS: CT CHEST FINDINGS  Mediastinum/Nodes: No axillary supraclavicular lymphadenopathy. Small a precarinal lymph node measures 6 mm not changed from prior. No pericardial fluid. No central pulmonary embolism.  There is considerable decrease in distal esophageal wall thickening. Previously the esophagus wall measured up to 28 mm and now the greatest thickness is 12 mm.  Lungs/Pleura: There is centrilobular  emphysema in the upper lobes. No new or suspicious pulmonary nodules.  CT ABDOMEN AND PELVIS FINDINGS  Hepatobiliary: There is a new ill-defined low-attenuation region within the lateral left hepatic lobe. This corresponds to a hypermetabolic region on comparison PET-CT scan same day. There was no lesion on contrast enhanced CT of 04/08/2014 at this location. This is ill-defined region measure approximately 6.8 x 55 Cm. A smaller discrete hepatic hypodensity measuring 5 mm unchanged consistent with cyst.  Pancreas: Pancreas is normal. No ductal dilatation. No pancreatic inflammation.  Spleen: Normal spleen.  Adrenals/Urinary Tract: Left adrenal gland is enlarged to 2.8 x 2.2 cm not changed from 2.7 x 2.3 cm on prior. This lesion was hypermetabolic on comparison PET-CT scan. Mild thickening of the right adrenal gland is unchanged. Normal kidneys, ureters, and bladder.  Stomach/Bowel: There is a percutaneous gastrostomy tube in the stomach. The small bowel and colon are unremarkable.  Vascular/Lymphatic: Abdominal aorta is normal caliber. There is no retroperitoneal or periportal lymphadenopathy. No pelvic lymphadenopathy.  Reproductive: Uterus and adnexa are normal.  Musculoskeletal: No aggressive osseous lesion.  IMPRESSION: Chest Impression:  1. Marked reduction in thickness of the distal esophagus. 2. No evidence of pulmonary metastasis.  Abdomen / Pelvis Impression:  1. New ill-defined region of low attenuation within the left lateral hepatic lobe compared to CT of 04/08/2014. This lesion is hypermetabolic on comparison PET-CT and favored to be radiation inflammation rather than liver metastasis although this cannot be completely excluded. 2. Stable left adrenal metastasis. 3. No nodal metastasis in the abdomen pelvis. 4. No skeletal metastasis.   Electronically Signed   By: Suzy Bouchard M.D.   On: 07/01/2014 13:03   Nm Pet Image Initial (pi) Skull Base To Thigh  07/01/2014   CLINICAL DATA:  Initial  treatment strategy for esophageal cancer. Patient is undergoing chemotherapy and radiation therapy.  EXAM: NUCLEAR MEDICINE PET SKULL BASE TO THIGH  TECHNIQUE: 6.63 mCi F-18 FDG was injected intravenously. Full-ring PET imaging was performed from the skull base to thigh after the radiotracer. CT data was obtained and used for attenuation correction and anatomic localization.  FASTING BLOOD GLUCOSE:  Value: 113 mg/dl  COMPARISON:  CT scan 04/08/2014 and MRI 04/11/2014  FINDINGS: NECK  No hypermetabolic lymph nodes in the neck. 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 This area is FDG negative.  CHEST  There are radiation changes involving the paramediastinal the lungs bilaterally with FDG activity which is not unexpected. No pulmonary nodules are identified to suggest metastatic disease. There are biapical scarring  changes and advanced emphysematous changes.  No enlarged or metabolically active mediastinal or hilar lymph nodes.  The esophageal tumor demonstrates diffuse hypermetabolism with SUV max of 5.5. However, the tumor appears much smaller when compared to the prior chest CT. Stable extensive coronary artery calcifications.  ABDOMEN/PELVIS  Increased FDG uptake is noted in the left hepatic lobe with SUV max of 5.8. No discrete lesion is identified. Low-attenuation is noted on the unenhanced CT scan. Although infiltrating tumor is possible I think this is much more likely radiation change involving the liver.  The left adrenal gland is enlarged but demonstrates low-attenuation measuring 6 Hounsfield units. There is FDG uptake with SUV max of 4.0. This is still most likely a benign adenoma.  No metabolically active abdominal/ pelvic adenopathy.  The gastric feeding tube is noted with some activity noted along its tract.  SKELETON  No focal hypermetabolic activity to suggest skeletal metastasis.  IMPRESSION: 1. Interval decrease in size of the esophageal  neoplasm. Hypermetabolism as described above. No hypermetabolic mediastinal, hilar or upper abdominal lymph nodes. 2. Increased FDG uptake in the left hepatic lobe is most likely due to radiation change. Could not totally exclude the possibility of infiltrating tumor but I think it is unlikely. Surveillance is recommended. 3. Left adrenal gland lesion has CT characteristics of benign adenoma. There is slight increased FDG uptake which warrants surveillance. 4. Radiation changes involving both lungs but no findings for metastatic pulmonary disease.   Electronically Signed   By: Marijo Sanes M.D.   On: 07/01/2014 12:19    Impression:  The patient is recovering from the effects of radiation.  Favorable response to treatment based on PET scan results  Plan:  The patient will follow-up with medical oncology approximately 2 weeks for further planning of additional therapy. Routine follow-up in radiation oncology in 3 months.  ____________________________________ Blair Promise, MD

## 2014-07-04 NOTE — Progress Notes (Signed)
Reports decreased appetite. Ten pound weight loss noted over the last month. Denies difficulty swallowing. Denies cough or shortness of breath. Denies headache or dizziness. Patient denies nausea and vomiting but, family member confirms such. Takes two cans of ENSURE per day via PEG and nibbles on apple sauce. Family member confirms PO intake is very poor. Taken to ED several weeks ago for low potassium and weakness. Denies pain. Vitals stable.

## 2014-07-05 ENCOUNTER — Other Ambulatory Visit: Payer: Self-pay | Admitting: Nurse Practitioner

## 2014-07-05 ENCOUNTER — Telehealth: Payer: Self-pay | Admitting: Nurse Practitioner

## 2014-07-05 MED ORDER — OSMOLITE HN PO LIQD: 2.0000 | Freq: Three times a day (TID) | ORAL | Status: DC

## 2014-07-05 NOTE — Telephone Encounter (Addendum)
Family verbalized understanding and appreciation. Family also concerned that she may not be eating enough because she has lost 6lb. Instructed them to continue encouraging her to eat. Also they are currently feeding her twice a day with 2 cans of osmolite each feeding. Instructed per Judson Roch to encourage her to eat table food, however they may give her an additional 2 cans dor lunch if needed. Orders sent to [phone].   ----- Message from Volanda Napoleon, MD sent at 07/04/2014  3:59 PM EDT ----- Call her sister -- tumor has shrunk veery nicely!!!  We just need to follow this now!!!  Hopefully, she will be able to swallow better!!!  pete

## 2014-07-19 ENCOUNTER — Encounter: Payer: Self-pay | Admitting: Radiation Oncology

## 2014-07-19 NOTE — Progress Notes (Signed)
  Radiation Oncology         [phone] ________________________________  Name: Sheila Young MRN: [id number]  Date: 07/19/2014  DOB: 07/13/1950  End of Treatment Note  Diagnosis:  Advanced squamous cell carcinoma of the distal esophagus    Indication for treatment:  Definitive treatment along with radiosensitizing chemotherapy      Radiation treatment dates:   January 12 through 05/27/2014  Site/dose:   Distal esophageal area 50.4 gray in 28 fractions  Beams/energy:   Intensity modulated radiation therapy, helical, 6 megavoltage photons  Narrative: The patient tolerated radiation treatment relatively well.   She denies any significant fatigue during the course of treatment or significant pain within the chest.  Plan: The patient has completed radiation treatment. The patient will return to radiation oncology clinic for routine followup in one month. I advised them to call or return sooner if they have any questions or concerns related to their recovery or treatment.  -----------------------------------  Blair Promise, PhD, MD

## 2014-07-21 ENCOUNTER — Other Ambulatory Visit: Payer: Self-pay | Admitting: *Deleted

## 2014-07-21 DIAGNOSIS — C159 Malignant neoplasm of esophagus, unspecified: Secondary | ICD-10-CM

## 2014-07-22 ENCOUNTER — Ambulatory Visit (HOSPITAL_BASED_OUTPATIENT_CLINIC_OR_DEPARTMENT_OTHER): Payer: Medicare Other | Admitting: Hematology & Oncology

## 2014-07-22 ENCOUNTER — Encounter: Payer: Self-pay | Admitting: Hematology & Oncology

## 2014-07-22 ENCOUNTER — Other Ambulatory Visit: Payer: Self-pay | Admitting: Nurse Practitioner

## 2014-07-22 ENCOUNTER — Other Ambulatory Visit (HOSPITAL_BASED_OUTPATIENT_CLINIC_OR_DEPARTMENT_OTHER): Payer: Medicare Other

## 2014-07-22 VITALS — BP 135/82 | HR 81 | Temp 98.2°F | Resp 14 | Ht 66.0 in | Wt 129.0 lb

## 2014-07-22 DIAGNOSIS — C159 Malignant neoplasm of esophagus, unspecified: Secondary | ICD-10-CM

## 2014-07-22 LAB — CBC WITH DIFFERENTIAL (CANCER CENTER ONLY)
BASO#: 0.1 10*3/uL (ref 0.0–0.2)
BASO%: 0.5 % (ref 0.0–2.0)
EOS%: 3.8 % (ref 0.0–7.0)
Eosinophils Absolute: 0.4 10*3/uL (ref 0.0–0.5)
HCT: 34.3 % — ABNORMAL LOW (ref 34.8–46.6)
HGB: 11.2 g/dL — ABNORMAL LOW (ref 11.6–15.9)
LYMPH#: 1.9 10*3/uL (ref 0.9–3.3)
LYMPH%: 18.1 % (ref 14.0–48.0)
MCH: 30.9 pg (ref 26.0–34.0)
MCHC: 32.7 g/dL (ref 32.0–36.0)
MCV: 95 fL (ref 81–101)
MONO#: 1.2 10*3/uL — ABNORMAL HIGH (ref 0.1–0.9)
MONO%: 11.2 % (ref 0.0–13.0)
NEUT%: 66.4 % (ref 39.6–80.0)
NEUTROS ABS: 6.9 10*3/uL — AB (ref 1.5–6.5)
Platelets: 267 10*3/uL (ref 145–400)
RBC: 3.63 10*6/uL — ABNORMAL LOW (ref 3.70–5.32)
RDW: 16.3 % — ABNORMAL HIGH (ref 11.1–15.7)
WBC: 10.4 10*3/uL — ABNORMAL HIGH (ref 3.9–10.0)

## 2014-07-22 LAB — COMPREHENSIVE METABOLIC PANEL
ALBUMIN: 3.2 g/dL — AB (ref 3.5–5.2)
ALK PHOS: 79 U/L (ref 39–117)
ALT: 22 U/L (ref 0–35)
AST: 27 U/L (ref 0–37)
BILIRUBIN TOTAL: 0.4 mg/dL (ref 0.2–1.2)
BUN: 18 mg/dL (ref 6–23)
CO2: 32 mEq/L (ref 19–32)
Calcium: 10.1 mg/dL (ref 8.4–10.5)
Chloride: 101 mEq/L (ref 96–112)
Creatinine, Ser: 0.62 mg/dL (ref 0.50–1.10)
Glucose, Bld: 114 mg/dL — ABNORMAL HIGH (ref 70–99)
Potassium: 3.1 mEq/L — ABNORMAL LOW (ref 3.5–5.3)
SODIUM: 141 meq/L (ref 135–145)
Total Protein: 7.9 g/dL (ref 6.0–8.3)

## 2014-07-22 MED ORDER — OSMOLITE HN PO LIQD: 3.0000 | Freq: Two times a day (BID) | ORAL | Status: DC

## 2014-07-22 NOTE — Progress Notes (Signed)
Hematology and Oncology Follow Up Visit  Sheila Young [id number] 08/23/1950 64 y.o. 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  Young is is back for follow-up. She actually looks quite well. She has a feeding tube in place. She is getting this done 3 times a day area and we want try to get her down to 2 times a day that she will eat. She is not eating. She just is afraid to swallow. I recommended that she have an upper endoscopy but she refuses to have one done.  We did do recent scans on her. The scans show that she had a very nice response in the esophagus. No activity was noted on the PET scan. On the corresponding CT scan, she had considerable decrease in esophageal wall thickening. She had an ill-defined lesion in the left liver wasn't felt to be radiation. 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 This was metabolic on PET scan.  I really want to try to get out the feeding tube but again I just don't think that she is ready to have this taken out she is not eating.  She is still smoking.  She's had no pain issues. She's had no cough. She's had no bleeding.  Overall, her performance status is ECOG 2.   Medications:  Current outpatient prescriptions:  .  amLODipine (NORVASC) 10 MG tablet, Take 0.5 tablets (5 mg total) by mouth daily., Disp: 30 tablet, Rfl: 3 .  atorvastatin (LIPITOR) 40 MG tablet, Take 40 mg by mouth every morning. , Disp: , Rfl: 3 .  clopidogrel (PLAVIX) 75 MG tablet, Take 75 mg by mouth daily., Disp: , Rfl:  .  fexofenadine (ALLEGRA) 180 MG tablet, Take 180 mg by mouth as needed. , Disp: , Rfl:  .  FLUoxetine (PROZAC) 10 MG capsule, Take 1 capsule (10 mg total) by mouth daily., Disp: 30 capsule, Rfl: 4 .  folic acid (FOLVITE) 1 MG tablet, Take 1 tablet (1 mg total) by mouth daily.,  Disp: 30 tablet, Rfl: 4 .  lisinopril (PRINIVIL,ZESTRIL) 40 MG tablet, Take 40 mg by mouth daily. , Disp: , Rfl: 3 .  magnesium gluconate (MAGONATE) 500 MG tablet, Take 500 mg by mouth daily. 250 mg daily, Disp: , Rfl:  .  pantoprazole (PROTONIX) 40 MG tablet, Take 1 tablet (40 mg total) by mouth daily., Disp: 30 tablet, Rfl: 3 .  Potassium Chloride 40 MEQ/15ML (20%) SOLN, Take 40 mEq by mouth daily. 15 ml daily, Disp: 473 mL, Rfl: 3 .  dexamethasone (DECADRON) 4 MG tablet, Take 2 tablets (8 mg total) by mouth 2 (two) times daily with a meal. Start the day after chemotherapy for 3 days. (Patient not taking: Reported on 07/22/2014), Disp: 30 tablet, Rfl: 1 .  LORazepam (ATIVAN) 0.5 MG tablet, Take 1 tablet (0.5 mg total) by mouth every 6 (six) hours as needed (nausea,  vomiting). (Patient not taking: Reported on 07/22/2014), Disp: 30 tablet, Rfl: 0 .  Nutritional Supplements (OSMOLITE HN) LIQD, 3 Cans by PEG Tube route 2 (two) times daily. 3 cans per Peg tube every morning and 3 cans every evening., Disp: 180 Bottle, Rfl: 6 .  ondansetron (ZOFRAN) 8 MG tablet, Take 1 tablet (8 mg total) by mouth 2 (two) times daily. Start the day after chemo for 3 days.  Then take as needed for nausea or vomiting. (Patient not taking: Reported on 07/22/2014), Disp: 30 tablet, Rfl: 1 .  oxyCODONE (ROXICODONE INTENSOL) 20 MG/ML concentrated solution, Take 0.3-0.5 mLs (6-10 mg total) by mouth every 4 (four) hours as needed for severe pain. (Patient not taking: Reported on 05/24/2014), Disp: 30 mL, Rfl: 0  Allergies: No Known Allergies  Past Medical History, Surgical history, Social history, and Family History were reviewed and updated.  Review of Systems: As above  Physical Exam:  height is 5\' 6"  (1.676 m) and weight is 129 lb (58.514 kg). Her oral temperature is 98.2 F (36.8 C). Her blood pressure is 135/82 and her pulse is 81. Her respiration is 14.   Fairly well-developed and well-nourished African American female.  Head and neck exam shows no ocular or oral lesions. There are no palpable cervical or supraclavicular lymph nodes. Lungs are clear. Cardiac exam regular rate and rhythm with no murmurs, rubs or bruits. Abdomen is soft. She has a feeding tube in place. There is no erythema about the feeding tube insertion site. There is no swelling. There is no exudate. She has no fluid wave. There is no guarding or rebound tenderness. Is no palpable liver or spleen tip. Back exam shows no tenderness over the spine, ribs or hips. Extremities shows no clubbing, cyanosis or edema. She has good range of motion of her joints. She has h good strength. Neurological exam shows no focal neurological deficits.  Lab Results  Component Value Date   WBC 10.4* 07/22/2014   HGB 11.2* 07/22/2014   HCT 34.3* 07/22/2014   MCV 95 07/22/2014   PLT 267 07/22/2014     Chemistry      Component Value Date/Time   NA 142 05/27/2014 0859   NA 137 04/19/2014 0645   K 2.8* 05/27/2014 0859   K 3.2* 04/19/2014 0645   CL 96* 05/27/2014 0859   CL 100 04/19/2014 0645   CO2 32 05/27/2014 0859   CO2 28 04/19/2014 0645   BUN 20 05/27/2014 0859   BUN 15 04/19/2014 0645   CREATININE 0.9 05/27/2014 0859   CREATININE 0.63 04/19/2014 0645      Component Value Date/Time   CALCIUM 10.1 05/27/2014 0859   CALCIUM 9.8 04/19/2014 0645   ALKPHOS 51 05/27/2014 0859   ALKPHOS 66 04/19/2014 0645   AST 16 05/27/2014 0859   AST 20 04/19/2014 0645   ALT 14 05/27/2014 0859   ALT 20 04/19/2014 0645   BILITOT 0.60 05/27/2014 0859   BILITOT 0.7 04/19/2014 0645         Impression and Plan: Sheila Young is 64 year old African-American female. Shows locally advanced squamous cell carcinoma of the esophagus.  I'm not sure what to make of this adrenal lesion. I suppose that she may have metastatic disease but she is totally asymptomatic. Nothing has changed since her last scans.  Again, try to get her to eat and swallow is going to be the real  problem. I still think that she will do this. She would not agree to have a upper endoscopy done. I really think that this would be incredibly helpful.  I want to go ahead and repeat her CT and PET scan in about 2 months. I think this would be helpful to see if there is any changes. I told her that if she continues to smoke, then her cancer definitely is going to recur. I told her that if she does not eat, this also will help the cancer  come back. I'm not sure she really understands this.  I will plan to see her back about a week after her scans.  I spent about 40 minutes with her today.   Volanda Napoleon, MD 4/15/201612:22 PM

## 2014-08-11 ENCOUNTER — Other Ambulatory Visit: Payer: Self-pay | Admitting: Nurse Practitioner

## 2014-08-11 DIAGNOSIS — C159 Malignant neoplasm of esophagus, unspecified: Secondary | ICD-10-CM

## 2014-08-11 MED ORDER — PANTOPRAZOLE SODIUM 40 MG PO TBEC: 40.0000 mg | DELAYED_RELEASE_TABLET | Freq: Every day | ORAL | Status: DC

## 2014-08-11 MED ORDER — CLOPIDOGREL BISULFATE 75 MG PO TABS: 75.0000 mg | ORAL_TABLET | Freq: Every day | ORAL | Status: DC

## 2014-09-12 ENCOUNTER — Ambulatory Visit (HOSPITAL_COMMUNITY): Payer: Medicare Other

## 2014-09-12 ENCOUNTER — Ambulatory Visit (HOSPITAL_COMMUNITY)
Admission: RE | Admit: 2014-09-12 | Discharge: 2014-09-12 | Disposition: A | Discharge status: Home / Self Care | Payer: Medicare Other | Source: Ambulatory Visit | Attending: Hematology & Oncology | Admitting: Hematology & Oncology

## 2014-09-12 ENCOUNTER — Telehealth: Payer: Self-pay | Admitting: *Deleted

## 2014-09-12 ENCOUNTER — Encounter (HOSPITAL_COMMUNITY)
Admission: RE | Admit: 2014-09-12 | Discharge: 2014-09-12 | Disposition: A | Discharge status: Home / Self Care | Payer: Medicare Other | Source: Ambulatory Visit | Attending: Hematology & Oncology | Admitting: Hematology & Oncology

## 2014-09-12 ENCOUNTER — Other Ambulatory Visit (HOSPITAL_BASED_OUTPATIENT_CLINIC_OR_DEPARTMENT_OTHER): Payer: Medicare Other

## 2014-09-12 ENCOUNTER — Encounter (HOSPITAL_COMMUNITY): Payer: Self-pay

## 2014-09-12 DIAGNOSIS — D259 Leiomyoma of uterus, unspecified: Secondary | ICD-10-CM | POA: Diagnosis not present

## 2014-09-12 DIAGNOSIS — J439 Emphysema, unspecified: Secondary | ICD-10-CM | POA: Diagnosis not present

## 2014-09-12 DIAGNOSIS — I2510 Atherosclerotic heart disease of native coronary artery without angina pectoris: Secondary | ICD-10-CM | POA: Diagnosis not present

## 2014-09-12 DIAGNOSIS — N200 Calculus of kidney: Secondary | ICD-10-CM | POA: Insufficient documentation

## 2014-09-12 DIAGNOSIS — Z79899 Other long term (current) drug therapy: Secondary | ICD-10-CM | POA: Insufficient documentation

## 2014-09-12 DIAGNOSIS — R918 Other nonspecific abnormal finding of lung field: Secondary | ICD-10-CM | POA: Diagnosis not present

## 2014-09-12 DIAGNOSIS — E279 Disorder of adrenal gland, unspecified: Secondary | ICD-10-CM | POA: Diagnosis not present

## 2014-09-12 DIAGNOSIS — C159 Malignant neoplasm of esophagus, unspecified: Secondary | ICD-10-CM | POA: Insufficient documentation

## 2014-09-12 DIAGNOSIS — I6523 Occlusion and stenosis of bilateral carotid arteries: Secondary | ICD-10-CM | POA: Insufficient documentation

## 2014-09-12 LAB — CBC WITH DIFFERENTIAL/PLATELET
BASO%: 0.5 % (ref 0.0–2.0)
Basophils Absolute: 0 10*3/uL (ref 0.0–0.1)
EOS ABS: 0.2 10*3/uL (ref 0.0–0.5)
EOS%: 2 % (ref 0.0–7.0)
HEMATOCRIT: 38.4 % (ref 34.8–46.6)
HGB: 13.2 g/dL (ref 11.6–15.9)
LYMPH#: 1.3 10*3/uL (ref 0.9–3.3)
LYMPH%: 13.3 % — ABNORMAL LOW (ref 14.0–49.7)
MCH: 29.7 pg (ref 25.1–34.0)
MCHC: 34.4 g/dL (ref 31.5–36.0)
MCV: 86.3 fL (ref 79.5–101.0)
MONO#: 0.8 10*3/uL (ref 0.1–0.9)
MONO%: 8.4 % (ref 0.0–14.0)
NEUT#: 7.2 10*3/uL — ABNORMAL HIGH (ref 1.5–6.5)
NEUT%: 75.8 % (ref 38.4–76.8)
Platelets: 235 10*3/uL (ref 145–400)
RBC: 4.45 10*6/uL (ref 3.70–5.45)
RDW: 15.8 % — ABNORMAL HIGH (ref 11.2–14.5)
WBC: 9.5 10*3/uL (ref 3.9–10.3)

## 2014-09-12 LAB — COMPREHENSIVE METABOLIC PANEL (CC13)
ALT: 15 U/L (ref 0–55)
AST: 36 U/L — ABNORMAL HIGH (ref 5–34)
Albumin: 3.3 g/dL — ABNORMAL LOW (ref 3.5–5.0)
Alkaline Phosphatase: 79 U/L (ref 40–150)
Anion Gap: 14 mEq/L — ABNORMAL HIGH (ref 3–11)
BUN: 14.8 mg/dL (ref 7.0–26.0)
CO2: 36 mEq/L — ABNORMAL HIGH (ref 22–29)
Calcium: 10.3 mg/dL (ref 8.4–10.4)
Chloride: 93 mEq/L — ABNORMAL LOW (ref 98–109)
Creatinine: 1.1 mg/dL (ref 0.6–1.1)
EGFR: 62 mL/min/{1.73_m2} — ABNORMAL LOW (ref >90)
Glucose: 103 mg/dl (ref 70–140)
Potassium: 2 mEq/L — CL (ref 3.5–5.1)
Sodium: 144 mEq/L (ref 136–145)
Total Bilirubin: 0.59 mg/dL (ref 0.20–1.20)
Total Protein: 7.9 g/dL (ref 6.4–8.3)

## 2014-09-12 LAB — GLUCOSE, CAPILLARY: Glucose-Capillary: 81 mg/dL (ref 65–99)

## 2014-09-12 LAB — LACTATE DEHYDROGENASE (CC13): LDH: 202 U/L (ref 125–245)

## 2014-09-12 MED ORDER — FLUDEOXYGLUCOSE F - 18 (FDG) INJECTION: 6.2000 | Freq: Once | INTRAVENOUS | Status: AC | PRN

## 2014-09-12 MED ORDER — POTASSIUM CHLORIDE 40 MEQ/15ML (20%) PO SOLN: 40.0000 meq | Freq: Two times a day (BID) | ORAL | Status: DC

## 2014-09-12 MED ORDER — IOHEXOL 300 MG/ML  SOLN
100.0000 mL | Freq: Once | INTRAMUSCULAR | Status: AC | PRN
  Administered 2014-09-12: 80 mL via INTRAVENOUS

## 2014-09-12 MED ORDER — FLUDEOXYGLUCOSE F - 18 (FDG) INJECTION
6.2100 | Freq: Once | INTRAVENOUS | Status: AC | PRN
  Administered 2014-09-12: 6.21 via INTRAVENOUS

## 2014-09-12 NOTE — Telephone Encounter (Signed)
Critical Lab Value - Potassium 2.0 Dr Marin Olp notified and orders received.    Spoke with [facility], the individual who runs the assisted living where [facility] resides and verified that patient is taking 49meq of potassium daily. Dr Marin Olp would like potassium increased to 75meq BID for 10 days then return to daily dosing. Prescription sent in and [facility] aware of change.

## 2014-09-13 ENCOUNTER — Telehealth: Payer: Self-pay | Admitting: *Deleted

## 2014-09-13 NOTE — Telephone Encounter (Addendum)
Sister aware of results. Thankful of results.   ----- Message from Volanda Napoleon, MD sent at 09/12/2014  7:12 PM EDT ----- Please call her sister and tell her that we don't see anything that shows obvious metastatic disease. Esophagus wall is still getting smaller which is a good thing. We don't see any evidence of disease elsewhere in the chest or in the abdomen. Thanks

## 2014-09-19 ENCOUNTER — Encounter: Payer: Self-pay | Admitting: Hematology & Oncology

## 2014-09-19 ENCOUNTER — Ambulatory Visit (HOSPITAL_BASED_OUTPATIENT_CLINIC_OR_DEPARTMENT_OTHER): Payer: Medicare Other | Admitting: Hematology & Oncology

## 2014-09-19 VITALS — BP 136/71 | HR 67 | Temp 97.4°F | Resp 16 | Wt 123.0 lb

## 2014-09-19 DIAGNOSIS — C159 Malignant neoplasm of esophagus, unspecified: Secondary | ICD-10-CM | POA: Diagnosis present

## 2014-09-19 NOTE — Progress Notes (Signed)
Hematology and Oncology Follow Up Visit  Sheila Young [id number] June 28, 1950 64 y.o. 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  Sheila Young is is back for follow-up. She actually looks quite well. She has a feeding tube in place. She is swallowing fairly well. As such, I think we get the PEG tube removed.   She is still smoking. She says that she was smoking 2 or 3 cigarettes a day.  We did go ahead and get CT scans a PET scan on her. This was done about a week ago. The scans were not definitive in any type of recurrent or metastatic disease. On the PET scan, there is some hypermetabolism in the distal esophagus. No obvious masses noted. The left adrenal nodule has not changed in size or density. It is felt that this is an adenoma. On CT scan, this measured 2.7 x 2.4 cm.  She's had no pain issues. She's had no cough. She's had no bleeding.  Overall, her performance status is ECOG 1.   Medications:  Current outpatient prescriptions:  .  amLODipine (NORVASC) 10 MG tablet, Take 0.5 tablets (5 mg total) by mouth daily., Disp: 30 tablet, Rfl: 3 .  atorvastatin (LIPITOR) 40 MG tablet, Take 40 mg by mouth every morning. , Disp: , Rfl: 3 .  clopidogrel (PLAVIX) 75 MG tablet, Take 1 tablet (75 mg total) by mouth daily., Disp: 30 tablet, Rfl: 4 .  dexamethasone (DECADRON) 4 MG tablet, Take 2 tablets (8 mg total) by mouth 2 (two) times daily with a meal. Start the day after chemotherapy for 3 days. (Patient not taking: Reported on 07/22/2014), Disp: 30 tablet, Rfl: 1 .  fexofenadine (ALLEGRA) 180 MG tablet, Take 180 mg by mouth as needed. , Disp: , Rfl:  .  FLUoxetine (PROZAC) 10 MG capsule, Take 1 capsule (10 mg total) by mouth daily., Disp: 30 capsule, Rfl: 4 .  folic acid (FOLVITE) 1 MG tablet, Take 1 tablet (1 mg total) by mouth daily.,  Disp: 30 tablet, Rfl: 4 .  lisinopril (PRINIVIL,ZESTRIL) 40 MG tablet, Take 40 mg by mouth daily. , Disp: , Rfl: 3 .  LORazepam (ATIVAN) 0.5 MG tablet, Take 1 tablet (0.5 mg total) by mouth every 6 (six) hours as needed (nausea,  vomiting). (Patient not taking: Reported on 07/22/2014), Disp: 30 tablet, Rfl: 0 .  magnesium gluconate (MAGONATE) 500 MG tablet, Take 500 mg by mouth daily. 250 mg daily, Disp: , Rfl:  .  Nutritional Supplements (OSMOLITE HN) LIQD, 3 Cans by PEG Tube route 2 (two) times daily. 3 cans per Peg tube every morning and 3 cans every evening., Disp: 180 Bottle, Rfl: 6 .  ondansetron (ZOFRAN) 8 MG tablet, Take 1 tablet (8 mg total) by mouth 2 (two) times daily. Start the day after chemo for 3 days. Then take as needed for nausea or vomiting. (Patient not taking: Reported on 07/22/2014), Disp: 30 tablet, Rfl: 1 .  oxyCODONE (ROXICODONE INTENSOL) 20 MG/ML concentrated solution, Take 0.3-0.5 mLs (6-10 mg total) by mouth every 4 (four) hours as needed for severe pain. (Patient not taking: Reported on 05/24/2014), Disp: 30 mL, Rfl: 0 .  pantoprazole (PROTONIX) 40 MG tablet, Take 1 tablet (40 mg total) by mouth daily., Disp: 30 tablet, Rfl: 4 .  Potassium Chloride 40 MEQ/15ML (20%) SOLN, Take 40 mEq by mouth 2 (two)  times daily. For 10 days then return to once daily., Disp: 473 mL, Rfl: 6  Allergies: No Known Allergies  Past Medical History, Surgical history, Social history, and Family History were reviewed and updated.  Review of Systems: As above  Physical Exam:  weight is 123 lb (55.792 kg). Her oral temperature is 97.4 F (36.3 C). Her blood pressure is 136/71 and her pulse is 67. Her respiration is 16 and oxygen saturation is 96%.   Fairly well-developed and well-nourished African American female. Head and neck exam shows no ocular or oral lesions. There are no palpable cervical or supraclavicular lymph nodes. Lungs are clear. Cardiac exam regular rate and rhythm with no murmurs,  rubs or bruits. Abdomen is soft. She has a feeding tube in place. There is no erythema about the feeding tube insertion site. There is no swelling. There is no exudate. She has no fluid wave. There is no guarding or rebound tenderness. Is no palpable liver or spleen tip. Back exam shows no tenderness over the spine, ribs or hips. Extremities shows no clubbing, cyanosis or edema. She has good range of motion of her joints. She has h good strength. Neurological exam shows no focal neurological deficits.  Lab Results  Component Value Date   WBC 9.5 09/12/2014   HGB 13.2 09/12/2014   HCT 38.4 09/12/2014   MCV 86.3 09/12/2014   PLT 235 09/12/2014     Chemistry      Component Value Date/Time   NA 144 09/12/2014 0920   NA 141 07/22/2014 1114   NA 142 05/27/2014 0859   K 2.0* 09/12/2014 0920   K 3.1* 07/22/2014 1114   K 2.8* 05/27/2014 0859   CL 101 07/22/2014 1114   CL 96* 05/27/2014 0859   CO2 36* 09/12/2014 0920   CO2 32 07/22/2014 1114   CO2 32 05/27/2014 0859   BUN 14.8 09/12/2014 0920   BUN 18 07/22/2014 1114   BUN 20 05/27/2014 0859   CREATININE 1.1 09/12/2014 0920   CREATININE 0.62 07/22/2014 1114   CREATININE 0.9 05/27/2014 0859      Component Value Date/Time   CALCIUM 10.3 09/12/2014 0920   CALCIUM 10.1 07/22/2014 1114   CALCIUM 10.1 05/27/2014 0859   ALKPHOS 79 09/12/2014 0920   ALKPHOS 79 07/22/2014 1114   ALKPHOS 51 05/27/2014 0859   AST 36* 09/12/2014 0920   AST 27 07/22/2014 1114   AST 16 05/27/2014 0859   ALT 15 09/12/2014 0920   ALT 22 07/22/2014 1114   ALT 14 05/27/2014 0859   BILITOT 0.59 09/12/2014 0920   BILITOT 0.4 07/22/2014 1114   BILITOT 0.60 05/27/2014 0859         Impression and Plan: Ms. Sheila Young is 64 year old African-American female. Shows locally advanced squamous cell carcinoma of the esophagus.  I think that the scans are reassuring.  We will go ahead and plan to get her PEG tube removed this week. I want to get this out before she  goes up to [place] for a graduation ceremony.  We do not have to get any other scans on her until after Labor Day. As such, she will be off all summer. I told her that she was can back if there is a problems in between visits.   I spent about 40 minutes with her today.   Volanda Napoleon, MD 6/13/201611:41 AM

## 2014-09-22 ENCOUNTER — Ambulatory Visit (HOSPITAL_COMMUNITY)
Admission: RE | Admit: 2014-09-22 | Discharge: 2014-09-22 | Disposition: A | Discharge status: Home / Self Care | Payer: Medicare Other | Source: Ambulatory Visit | Attending: Hematology & Oncology | Admitting: Hematology & Oncology

## 2014-09-22 DIAGNOSIS — Z431 Encounter for attention to gastrostomy: Secondary | ICD-10-CM | POA: Insufficient documentation

## 2014-09-22 DIAGNOSIS — C159 Malignant neoplasm of esophagus, unspecified: Secondary | ICD-10-CM

## 2014-09-22 MED ORDER — LIDOCAINE VISCOUS 2 % MT SOLN
OROMUCOSAL | Status: AC
  Filled 2014-09-22: qty 15

## 2014-09-22 MED ORDER — LIDOCAINE VISCOUS 2 % MT SOLN: 15.0000 mL | Freq: Once | OROMUCOSAL | Status: DC

## 2014-10-11 ENCOUNTER — Other Ambulatory Visit: Payer: Self-pay | Admitting: *Deleted

## 2014-10-11 DIAGNOSIS — C159 Malignant neoplasm of esophagus, unspecified: Secondary | ICD-10-CM

## 2014-10-11 MED ORDER — FLUOXETINE HCL 10 MG PO CAPS: 10.0000 mg | ORAL_CAPSULE | Freq: Every day | ORAL | Status: DC

## 2014-11-23 ENCOUNTER — Telehealth: Payer: Self-pay | Admitting: Hematology & Oncology

## 2014-11-23 NOTE — Telephone Encounter (Signed)
Faxed record request to [place]

## 2014-11-23 NOTE — Telephone Encounter (Signed)
Faxed over records per request for [facility]

## 2014-12-07 ENCOUNTER — Other Ambulatory Visit: Payer: Self-pay | Admitting: Hematology & Oncology

## 2014-12-08 ENCOUNTER — Ambulatory Visit (HOSPITAL_COMMUNITY): Payer: Medicare Other

## 2014-12-15 ENCOUNTER — Telehealth: Payer: Self-pay | Admitting: Hematology & Oncology

## 2014-12-15 ENCOUNTER — Encounter (HOSPITAL_COMMUNITY)
Admission: RE | Admit: 2014-12-15 | Discharge: 2014-12-15 | Disposition: A | Discharge status: Home / Self Care | Payer: Medicare Other | Source: Ambulatory Visit | Attending: Hematology & Oncology | Admitting: Hematology & Oncology

## 2014-12-15 ENCOUNTER — Other Ambulatory Visit: Payer: Self-pay | Admitting: *Deleted

## 2014-12-15 DIAGNOSIS — C159 Malignant neoplasm of esophagus, unspecified: Secondary | ICD-10-CM

## 2014-12-15 DIAGNOSIS — R911 Solitary pulmonary nodule: Secondary | ICD-10-CM | POA: Insufficient documentation

## 2014-12-15 DIAGNOSIS — Z79899 Other long term (current) drug therapy: Secondary | ICD-10-CM | POA: Diagnosis not present

## 2014-12-15 LAB — GLUCOSE, CAPILLARY: Glucose-Capillary: 86 mg/dL (ref 65–99)

## 2014-12-15 MED ORDER — AMLODIPINE BESYLATE 10 MG PO TABS: 5.0000 mg | ORAL_TABLET | Freq: Every day | ORAL | Status: DC

## 2014-12-15 MED ORDER — FLUDEOXYGLUCOSE F - 18 (FDG) INJECTION
6.1000 | Freq: Once | INTRAVENOUS | Status: DC | PRN
  Administered 2014-12-15: 6.1 via INTRAVENOUS
  Filled 2014-12-15: qty 6.1

## 2014-12-15 NOTE — Telephone Encounter (Signed)
Patient called and cx 12/16/14 and resch for 12/19/14

## 2014-12-16 ENCOUNTER — Ambulatory Visit: Payer: Medicare Other | Admitting: Family

## 2014-12-16 ENCOUNTER — Other Ambulatory Visit: Payer: Medicare Other

## 2014-12-19 ENCOUNTER — Other Ambulatory Visit (HOSPITAL_BASED_OUTPATIENT_CLINIC_OR_DEPARTMENT_OTHER): Payer: Medicare Other

## 2014-12-19 ENCOUNTER — Ambulatory Visit (HOSPITAL_BASED_OUTPATIENT_CLINIC_OR_DEPARTMENT_OTHER): Payer: Medicare Other | Admitting: Family

## 2014-12-19 ENCOUNTER — Encounter: Payer: Self-pay | Admitting: Family

## 2014-12-19 VITALS — BP 129/69 | HR 71 | Temp 98.2°F | Resp 18 | Ht 66.0 in | Wt 129.0 lb

## 2014-12-19 DIAGNOSIS — C159 Malignant neoplasm of esophagus, unspecified: Secondary | ICD-10-CM

## 2014-12-19 LAB — CBC WITH DIFFERENTIAL (CANCER CENTER ONLY)
BASO#: 0 10*3/uL (ref 0.0–0.2)
BASO%: 0.4 % (ref 0.0–2.0)
EOS%: 2.4 % (ref 0.0–7.0)
Eosinophils Absolute: 0.2 10*3/uL (ref 0.0–0.5)
HCT: 32 % — ABNORMAL LOW (ref 34.8–46.6)
HEMOGLOBIN: 10.8 g/dL — AB (ref 11.6–15.9)
LYMPH#: 1.7 10*3/uL (ref 0.9–3.3)
LYMPH%: 22.5 % (ref 14.0–48.0)
MCH: 31.2 pg (ref 26.0–34.0)
MCHC: 33.8 g/dL (ref 32.0–36.0)
MCV: 93 fL (ref 81–101)
MONO#: 0.7 10*3/uL (ref 0.1–0.9)
MONO%: 9.6 % (ref 0.0–13.0)
NEUT#: 4.9 10*3/uL (ref 1.5–6.5)
NEUT%: 65.1 % (ref 39.6–80.0)
Platelets: 228 10*3/uL (ref 145–400)
RBC: 3.46 10*6/uL — ABNORMAL LOW (ref 3.70–5.32)
RDW: 14.9 % (ref 11.1–15.7)
WBC: 7.5 10*3/uL (ref 3.9–10.0)

## 2014-12-19 LAB — COMPREHENSIVE METABOLIC PANEL
ALT: 18 U/L (ref 6–29)
AST: 21 U/L (ref 10–35)
Albumin: 3.3 g/dL — ABNORMAL LOW (ref 3.6–5.1)
Alkaline Phosphatase: 93 U/L (ref 33–130)
BUN: 18 mg/dL (ref 7–25)
CO2: 31 mmol/L (ref 20–31)
CREATININE: 1.02 mg/dL — AB (ref 0.50–0.99)
Calcium: 9.3 mg/dL (ref 8.6–10.4)
Chloride: 100 mmol/L (ref 98–110)
Glucose, Bld: 108 mg/dL — ABNORMAL HIGH (ref 65–99)
Potassium: 3.2 mmol/L — ABNORMAL LOW (ref 3.5–5.3)
SODIUM: 139 mmol/L (ref 135–146)
TOTAL PROTEIN: 6.5 g/dL (ref 6.1–8.1)
Total Bilirubin: 0.3 mg/dL (ref 0.2–1.2)

## 2014-12-19 LAB — LACTATE DEHYDROGENASE: LDH: 133 U/L (ref 94–250)

## 2014-12-19 MED ORDER — POTASSIUM CHLORIDE 40 MEQ/15ML (20%) PO SOLN: 40.0000 meq | Freq: Every day | ORAL | Status: DC

## 2014-12-19 NOTE — Progress Notes (Signed)
Hematology and Oncology Follow Up Visit  Sheila Young [id number] October 21, 1950 64 y.o. 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 She is doing well and has no complaints at this time. She is eating and not having much difficulty swallowing. Her niece states that she had an EGD recently and it was recommended she have another esophageal dilation. This is fine from our standpoint.  She was hospitalized recently for elevated magnesium and potassium. We did recheck these levels today. She is taking supplements for both right now.  She did have increased size and metabolic activity in her left upper lobe nodule. We will repeat a CT scan in 6 weeks to re-evaluate.  She has had no issue with infections. No fever, chills, n/v, cough, rash, dizziness, SOB, chest pain, palpitations, abdominal pain, changes in bowel or bladder habits. She has had no blood in her urine or stool.  She had her PEG tube removed and the area is healing nicely. No signs of infection.  She is still smoking 2-3 cigarettes a day. She is trying to quit but not sure if she can completely.  She has had no swelling, tenderness, numbness or tingling in her extremities. No c/o pain.  She is eating well and staying hydrated. She is up 6 lbs since her last visit.   Medications:    Medication List       This list is accurate as of: 12/19/14  3:36 PM.  Always use your most recent med list.               amLODipine 10 MG tablet  Commonly known as:  NORVASC  Take 0.5 tablets (5 mg total) by mouth daily.     atorvastatin 40 MG tablet  Commonly known as:  LIPITOR  Take 40 mg by mouth every morning.     clopidogrel 75 MG tablet  Commonly known as:  PLAVIX  Take 1 tablet (75 mg total) by mouth daily.     fexofenadine 180 MG  tablet  Commonly known as:  ALLEGRA  Take 180 mg by mouth as needed.     FLUoxetine 10 MG capsule  Commonly known as:  PROZAC  Take 1 capsule (10 mg total) by mouth daily.     folic acid 1 MG tablet  Commonly known as:  FOLVITE  TAKE ONE TABLET BY MOUTH ONCE DAILY. (ROUND YELLOW TAB WITH V 31/62)     LORazepam 0.5 MG tablet  Commonly known as:  ATIVAN  Take 1 tablet (0.5 mg total) by mouth every 6 (six) hours as needed (nausea,  vomiting).     magnesium gluconate 500 MG tablet  Commonly known as:  MAGONATE  Take 500 mg by mouth daily. 250 mg daily     pantoprazole 40 MG tablet  Commonly known as:  PROTONIX  Take 1 tablet (40 mg total) by mouth daily.     Potassium Chloride 40 MEQ/15ML (20%) Soln  Take 40 mEq by mouth 2 (two) times daily. For 10 days then return to once daily.        Allergies: No Known Allergies  Past Medical History, Surgical history, Social history, and Family History were reviewed and updated.  Review of Systems: All other 10 point review of systems is negative.   Physical Exam:  height is  5\' 6"  (1.676 m) and weight is 129 lb (58.514 kg). Her oral temperature is 98.2 F (36.8 C). Her blood pressure is 129/69 and her pulse is 71. Her respiration is 18.   Wt Readings from Last 3 Encounters:  12/19/14 129 lb (58.514 kg)  09/19/14 123 lb (55.792 kg)  07/22/14 129 lb (58.514 kg)    Ocular: Sclerae unicteric, pupils equal, round and reactive to light Ear-nose-throat: Oropharynx clear, dentition fair Lymphatic: No cervical or supraclavicular adenopathy Lungs no rales or rhonchi, good excursion bilaterally Heart regular rate and rhythm, no murmur appreciated Abd soft, nontender, positive bowel sounds MSK no focal spinal tenderness, no joint edema Neuro: non-focal, well-oriented, appropriate affect Breasts: Deferred  Lab Results  Component Value Date   WBC 7.5 12/19/2014   HGB 10.8* 12/19/2014   HCT 32.0* 12/19/2014   MCV 93 12/19/2014   PLT  228 12/19/2014   Lab Results  Component Value Date   FERRITIN 345* 04/07/2014   IRON 33* 04/07/2014   TIBC 235* 04/07/2014   UIBC 202 04/07/2014   IRONPCTSAT 14* 04/07/2014   Lab Results  Component Value Date   RETICCTPCT 1.1 04/07/2014   RBC 3.46* 12/19/2014   RETICCTABS 43.3 04/07/2014   No results found for: KPAFRELGTCHN, LAMBDASER, KAPLAMBRATIO No results found for: Kandis Cocking, IGMSERUM No results found for: Odetta Pink, SPEI   Chemistry      Component Value Date/Time   NA 144 09/12/2014 0920   NA 141 07/22/2014 1114   NA 142 05/27/2014 0859   K 2.0* 09/12/2014 0920   K 3.1* 07/22/2014 1114   K 2.8* 05/27/2014 0859   CL 101 07/22/2014 1114   CL 96* 05/27/2014 0859   CO2 36* 09/12/2014 0920   CO2 32 07/22/2014 1114   CO2 32 05/27/2014 0859   BUN 14.8 09/12/2014 0920   BUN 18 07/22/2014 1114   BUN 20 05/27/2014 0859   CREATININE 1.1 09/12/2014 0920   CREATININE 0.62 07/22/2014 1114   CREATININE 0.9 05/27/2014 0859      Component Value Date/Time   CALCIUM 10.3 09/12/2014 0920   CALCIUM 10.1 07/22/2014 1114   CALCIUM 10.1 05/27/2014 0859   ALKPHOS 79 09/12/2014 0920   ALKPHOS 79 07/22/2014 1114   ALKPHOS 51 05/27/2014 0859   AST 36* 09/12/2014 0920   AST 27 07/22/2014 1114   AST 16 05/27/2014 0859   ALT 15 09/12/2014 0920   ALT 22 07/22/2014 1114   ALT 14 05/27/2014 0859   BILITOT 0.59 09/12/2014 0920   BILITOT 0.4 07/22/2014 1114   BILITOT 0.60 05/27/2014 0859     Impression and Plan: Sheila Young is a pleasant 64 yo African American female with locally advanced squamous cell carcinoma of the esophagus. She is doing well and enjoying being able to eat again. She is asymptomatic at this time.  We will see what her potassium and magnesium are and adjust her supplements accordingly.  We will repeat a CT of the chest to re-evaluate her left upper lobe nodule at her next follow-up It is ok from our  standpoint for her to have another esophageal dilation if needed.  We will plan to see her back in 6 weeks for labs and follow-up.  Both she and her niece know to contact us with any questions or concerns.  We can certainly see her sooner if need be.   Eliezer Bottom, NP 9/12/20163:36 PM

## 2014-12-20 ENCOUNTER — Other Ambulatory Visit (HOSPITAL_BASED_OUTPATIENT_CLINIC_OR_DEPARTMENT_OTHER): Payer: Medicare Other | Admitting: Lab

## 2014-12-20 ENCOUNTER — Telehealth: Payer: Self-pay | Admitting: Hematology & Oncology

## 2014-12-20 DIAGNOSIS — C159 Malignant neoplasm of esophagus, unspecified: Secondary | ICD-10-CM

## 2014-12-20 LAB — MAGNESIUM (CC13): Magnesium: 1.7 mg/dl (ref 1.5–2.5)

## 2014-12-20 NOTE — Telephone Encounter (Signed)
Called Patient's Niece Marya Fossa to let her know that we were able to have patient's CT SCAN scheduled for 1:30pm on 02/06/2015 here at the [facility] [place] [facility]. Patient will be seen in our office after the CT SCAN at 2:30pm. Thank You!!!       [facility].

## 2015-01-11 ENCOUNTER — Other Ambulatory Visit: Payer: Self-pay | Admitting: *Deleted

## 2015-01-11 DIAGNOSIS — C159 Malignant neoplasm of esophagus, unspecified: Secondary | ICD-10-CM

## 2015-01-11 MED ORDER — PANTOPRAZOLE SODIUM 40 MG PO TBEC: 40.0000 mg | DELAYED_RELEASE_TABLET | Freq: Every day | ORAL | Status: DC

## 2015-01-11 MED ORDER — POTASSIUM CHLORIDE 40 MEQ/15ML (20%) PO SOLN: 40.0000 meq | Freq: Every day | ORAL | Status: DC

## 2015-01-11 MED ORDER — CLOPIDOGREL BISULFATE 75 MG PO TABS: 75.0000 mg | ORAL_TABLET | Freq: Every day | ORAL | Status: DC

## 2015-01-24 ENCOUNTER — Other Ambulatory Visit: Payer: Self-pay | Admitting: *Deleted

## 2015-01-30 ENCOUNTER — Ambulatory Visit (HOSPITAL_BASED_OUTPATIENT_CLINIC_OR_DEPARTMENT_OTHER)
Admission: RE | Admit: 2015-01-30 | Discharge: 2015-01-30 | Disposition: A | Discharge status: Home / Self Care | Payer: Medicare Other | Source: Ambulatory Visit | Attending: Family | Admitting: Family

## 2015-01-30 ENCOUNTER — Encounter (HOSPITAL_BASED_OUTPATIENT_CLINIC_OR_DEPARTMENT_OTHER): Payer: Self-pay

## 2015-01-30 DIAGNOSIS — R911 Solitary pulmonary nodule: Secondary | ICD-10-CM | POA: Diagnosis not present

## 2015-01-30 DIAGNOSIS — C159 Malignant neoplasm of esophagus, unspecified: Secondary | ICD-10-CM | POA: Diagnosis present

## 2015-01-30 MED ORDER — IOHEXOL 300 MG/ML  SOLN
75.0000 mL | Freq: Once | INTRAMUSCULAR | Status: AC | PRN
  Administered 2015-01-30: 75 mL via INTRAVENOUS

## 2015-02-01 ENCOUNTER — Telehealth: Payer: Self-pay | Admitting: Family

## 2015-02-01 NOTE — Telephone Encounter (Signed)
I spoke with Ms. Sheila Young's niece and went over the results of her CT scan from Monday. Al questions were answered. Ms. Sheila Young has a follow-up with Dr. Marin Olp on Monday October 31st. We will plan to see her then.

## 2015-02-02 NOTE — Telephone Encounter (Signed)
error 

## 2015-02-06 ENCOUNTER — Other Ambulatory Visit (HOSPITAL_BASED_OUTPATIENT_CLINIC_OR_DEPARTMENT_OTHER): Payer: Medicare Other

## 2015-02-06 ENCOUNTER — Other Ambulatory Visit: Payer: Medicare Other

## 2015-02-06 ENCOUNTER — Ambulatory Visit: Payer: Medicare Other | Admitting: Hematology & Oncology

## 2015-02-09 ENCOUNTER — Other Ambulatory Visit: Payer: Self-pay | Admitting: *Deleted

## 2015-03-03 ENCOUNTER — Other Ambulatory Visit: Payer: Medicare Other

## 2015-03-03 ENCOUNTER — Ambulatory Visit: Payer: Medicare Other | Admitting: Hematology & Oncology

## 2015-03-14 ENCOUNTER — Other Ambulatory Visit: Payer: Medicare Other

## 2015-03-14 ENCOUNTER — Ambulatory Visit: Payer: Medicare Other | Admitting: Hematology & Oncology

## 2015-03-16 ENCOUNTER — Other Ambulatory Visit: Payer: Self-pay | Admitting: *Deleted

## 2015-03-16 DIAGNOSIS — C159 Malignant neoplasm of esophagus, unspecified: Secondary | ICD-10-CM

## 2015-03-16 MED ORDER — FLUOXETINE HCL 10 MG PO CAPS: 10.0000 mg | ORAL_CAPSULE | Freq: Every day | ORAL | Status: DC

## 2015-03-27 ENCOUNTER — Telehealth: Payer: Self-pay | Admitting: *Deleted

## 2015-03-27 ENCOUNTER — Encounter: Payer: Self-pay | Admitting: Hematology & Oncology

## 2015-03-27 ENCOUNTER — Ambulatory Visit (HOSPITAL_BASED_OUTPATIENT_CLINIC_OR_DEPARTMENT_OTHER): Payer: Medicare Other | Admitting: Hematology & Oncology

## 2015-03-27 ENCOUNTER — Other Ambulatory Visit (HOSPITAL_BASED_OUTPATIENT_CLINIC_OR_DEPARTMENT_OTHER): Payer: Medicare Other

## 2015-03-27 VITALS — BP 178/80 | HR 67 | Temp 97.9°F | Resp 16 | Ht 66.0 in | Wt 126.0 lb

## 2015-03-27 DIAGNOSIS — C154 Malignant neoplasm of middle third of esophagus: Secondary | ICD-10-CM

## 2015-03-27 DIAGNOSIS — Z720 Tobacco use: Secondary | ICD-10-CM

## 2015-03-27 DIAGNOSIS — C159 Malignant neoplasm of esophagus, unspecified: Secondary | ICD-10-CM

## 2015-03-27 DIAGNOSIS — E876 Hypokalemia: Secondary | ICD-10-CM

## 2015-03-27 LAB — COMPREHENSIVE METABOLIC PANEL
ALBUMIN: 3.7 g/dL (ref 3.5–5.0)
ALK PHOS: 129 U/L (ref 40–150)
ALT: 13 U/L (ref 0–55)
AST: 15 U/L (ref 5–34)
Anion Gap: 9 mEq/L (ref 3–11)
BILIRUBIN TOTAL: 0.58 mg/dL (ref 0.20–1.20)
BUN: 20.2 mg/dL (ref 7.0–26.0)
CO2: 32 mEq/L — ABNORMAL HIGH (ref 22–29)
Calcium: 10.2 mg/dL (ref 8.4–10.4)
Chloride: 104 mEq/L (ref 98–109)
Creatinine: 1 mg/dL (ref 0.6–1.1)
EGFR: 67 mL/min/{1.73_m2} — ABNORMAL LOW (ref >90)
GLUCOSE: 107 mg/dL (ref 70–140)
POTASSIUM: 2.8 meq/L — AB (ref 3.5–5.1)
SODIUM: 145 meq/L (ref 136–145)
TOTAL PROTEIN: 8.1 g/dL (ref 6.4–8.3)

## 2015-03-27 LAB — CBC WITH DIFFERENTIAL (CANCER CENTER ONLY)
BASO#: 0 10*3/uL (ref 0.0–0.2)
BASO%: 0.3 % (ref 0.0–2.0)
EOS%: 1.3 % (ref 0.0–7.0)
Eosinophils Absolute: 0.2 10*3/uL (ref 0.0–0.5)
HCT: 37.9 % (ref 34.8–46.6)
HEMOGLOBIN: 12.5 g/dL (ref 11.6–15.9)
LYMPH#: 1.8 10*3/uL (ref 0.9–3.3)
LYMPH%: 15.8 % (ref 14.0–48.0)
MCH: 30 pg (ref 26.0–34.0)
MCHC: 33 g/dL (ref 32.0–36.0)
MCV: 91 fL (ref 81–101)
MONO#: 0.9 10*3/uL (ref 0.1–0.9)
MONO%: 7.9 % (ref 0.0–13.0)
NEUT%: 74.7 % (ref 39.6–80.0)
NEUTROS ABS: 8.4 10*3/uL — AB (ref 1.5–6.5)
Platelets: 240 10*3/uL (ref 145–400)
RBC: 4.16 10*6/uL (ref 3.70–5.32)
RDW: 14.4 % (ref 11.1–15.7)
WBC: 11.2 10*3/uL — AB (ref 3.9–10.0)

## 2015-03-27 LAB — MAGNESIUM: Magnesium: 2.1 mg/dl (ref 1.5–2.5)

## 2015-03-27 LAB — LACTATE DEHYDROGENASE: LDH: 174 U/L (ref 125–245)

## 2015-03-27 MED ORDER — POTASSIUM CHLORIDE CRYS ER 20 MEQ PO TBCR: 40.0000 meq | EXTENDED_RELEASE_TABLET | Freq: Once | ORAL | Status: DC

## 2015-03-27 NOTE — Progress Notes (Signed)
Hematology and Oncology Follow Up Visit  Dynah Lehne [id number] [birth year] 64 y.o. 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 Kalkwarf is here today with her niece for a follow-up. She seems be doing fairly well. It is hard to say which she really is eating. I'm unsure how well she is taking her medications. She does not like the liquid potassium. We have to give this to her because potassium is always so low.   Her PEG tube is out. She is able to swallow. She is not complaining much in the way of dysphagia or odynophagia.  She is still smoking a little bit. She may smoke 3 cigarettes a day.  Her last scans were done back in October. There is no evidence of obvious metastatic disease.  She is not too fond of having scans done. As such, is been difficult to get her to get the scans done.  She's had no bleeding. There's been no change in bowel or bladder habits.  She's had no leg swelling. She's had no rashes.  It has probably been about a year since we first saw her. She is, a long way. tenderness, numbness or tingling in her extremities. No c/o pain.  She is eating well and staying hydrated. She is up 6 lbs since her last visit.   Medications:    Medication List       This list is accurate as of: 03/27/15  4:36 PM.  Always use your most recent med list.               amLODipine 10 MG tablet  Commonly known as:  NORVASC  Take 0.5 tablets (5 mg total) by mouth daily.     atorvastatin 40 MG tablet  Commonly known as:  LIPITOR  Take 40 mg by mouth every morning.     clopidogrel 75 MG tablet  Commonly known as:  PLAVIX  Take 1 tablet (75 mg total) by mouth daily.     fexofenadine 180 MG tablet  Commonly known as:  ALLEGRA  Take 180 mg by mouth as needed.     FLUoxetine 10 MG capsule    Commonly known as:  PROZAC  Take 1 capsule (10 mg total) by mouth daily.     folic acid 1 MG tablet  Commonly known as:  FOLVITE  TAKE ONE TABLET BY MOUTH ONCE DAILY. (ROUND YELLOW TAB WITH V 31/62)     LORazepam 0.5 MG tablet  Commonly known as:  ATIVAN  Take 1 tablet (0.5 mg total) by mouth every 6 (six) hours as needed (nausea,  vomiting).     magnesium gluconate 500 MG tablet  Commonly known as:  MAGONATE  Take 500 mg by mouth daily. 250 mg daily     pantoprazole 40 MG tablet  Commonly known as:  PROTONIX  Take 1 tablet (40 mg total) by mouth daily.     potassium chloride SA 20 MEQ tablet  Commonly known as:  K-DUR,KLOR-CON  Take 2 tablets (40 mEq total) by mouth once.        Allergies: No Known Allergies  Past Medical History, Surgical history, Social history, and Family History were reviewed and updated.  Review of Systems: All other 10 point review of systems is negative.   Physical Exam:  height is 5\' 6"  (1.676 m) and  weight is 126 lb (57.153 kg). Her oral temperature is 97.9 F (36.6 C). Her blood pressure is 178/80 and her pulse is 67. Her respiration is 16.   Wt Readings from Last 3 Encounters:  03/27/15 126 lb (57.153 kg)  12/19/14 129 lb (58.514 kg)  09/19/14 123 lb (55.792 kg)    Ocular: Sclerae unicteric, pupils equal, round and reactive to light Ear-nose-throat: Oropharynx clear, dentition fair Lymphatic: No cervical or supraclavicular adenopathy Lungs no rales or rhonchi, good excursion bilaterally Heart regular rate and rhythm, no murmur appreciated Abd soft, nontender, positive bowel sounds MSK no focal spinal tenderness, no joint edema Neuro: non-focal, well-oriented, appropriate affect Breasts: Deferred  Lab Results  Component Value Date   WBC 11.2* 03/27/2015   HGB 12.5 03/27/2015   HCT 37.9 03/27/2015   MCV 91 03/27/2015   PLT 240 03/27/2015   Lab Results  Component Value Date   FERRITIN 345* 04/07/2014   IRON 33* 04/07/2014    TIBC 235* 04/07/2014   UIBC 202 04/07/2014   IRONPCTSAT 14* 04/07/2014   Lab Results  Component Value Date   RETICCTPCT 1.1 04/07/2014   RBC 4.16 03/27/2015   RETICCTABS 43.3 04/07/2014   No results found for: KPAFRELGTCHN, LAMBDASER, KAPLAMBRATIO No results found for: IGGSERUM, IGA, IGMSERUM No results found for: Odetta Pink, SPEI   Chemistry      Component Value Date/Time   NA 145 03/27/2015 0844   NA 139 12/19/2014 1457   NA 142 05/27/2014 0859   K 2.8* 03/27/2015 0844   K 3.2* 12/19/2014 1457   K 2.8* 05/27/2014 0859   CL 100 12/19/2014 1457   CL 96* 05/27/2014 0859   CO2 32* 03/27/2015 0844   CO2 31 12/19/2014 1457   CO2 32 05/27/2014 0859   BUN 20.2 03/27/2015 0844   BUN 18 12/19/2014 1457   BUN 20 05/27/2014 0859   CREATININE 1.0 03/27/2015 0844   CREATININE 1.02* 12/19/2014 1457   CREATININE 0.9 05/27/2014 0859      Component Value Date/Time   CALCIUM 10.2 03/27/2015 0844   CALCIUM 9.3 12/19/2014 1457   CALCIUM 10.1 05/27/2014 0859   ALKPHOS 129 03/27/2015 0844   ALKPHOS 93 12/19/2014 1457   ALKPHOS 51 05/27/2014 0859   AST 15 03/27/2015 0844   AST 21 12/19/2014 1457   AST 16 05/27/2014 0859   ALT 13 03/27/2015 0844   ALT 18 12/19/2014 1457   ALT 14 05/27/2014 0859   BILITOT 0.58 03/27/2015 0844   BILITOT 0.3 12/19/2014 1457   BILITOT 0.60 05/27/2014 0859     Impression and Plan: Ms. Szuch is a pleasant 64 yo African American female with locally advanced squamous cell carcinoma of the esophagus. She was treated with radiation and concurrent chemotherapy. She completed all this back in mid February 2016.  She certainly is at risk for recurrence. However, her quality of life really is what's important.  The fact that we don't need to do any scans on her right now I think is helpful for her. She just does not like to have scans done.  I think we probably get her back in 4 months. We can see how  she's doing. At some point, he probably needs to get some scans on her next year.  Volanda Napoleon, MD 12/19/20164:36 PM

## 2015-03-27 NOTE — Telephone Encounter (Signed)
Critical Value Potassium 2.8 Dr Marin Olp notified. He gave patient oral potassium

## 2015-06-23 ENCOUNTER — Other Ambulatory Visit: Payer: Self-pay | Admitting: *Deleted

## 2015-06-23 DIAGNOSIS — C159 Malignant neoplasm of esophagus, unspecified: Secondary | ICD-10-CM

## 2015-06-23 MED ORDER — CLOPIDOGREL BISULFATE 75 MG PO TABS: 75.0000 mg | ORAL_TABLET | Freq: Every day | ORAL | Status: DC

## 2015-07-24 ENCOUNTER — Other Ambulatory Visit (HOSPITAL_BASED_OUTPATIENT_CLINIC_OR_DEPARTMENT_OTHER): Payer: Medicare Other

## 2015-07-24 ENCOUNTER — Ambulatory Visit (HOSPITAL_BASED_OUTPATIENT_CLINIC_OR_DEPARTMENT_OTHER): Payer: Medicare Other | Admitting: Family

## 2015-07-24 ENCOUNTER — Encounter: Payer: Self-pay | Admitting: Family

## 2015-07-24 VITALS — BP 145/62 | HR 74 | Temp 98.2°F | Resp 16 | Wt 143.0 lb

## 2015-07-24 DIAGNOSIS — C154 Malignant neoplasm of middle third of esophagus: Secondary | ICD-10-CM

## 2015-07-24 DIAGNOSIS — E876 Hypokalemia: Secondary | ICD-10-CM

## 2015-07-24 LAB — CBC WITH DIFFERENTIAL (CANCER CENTER ONLY)
BASO#: 0 10*3/uL (ref 0.0–0.2)
BASO%: 0.3 % (ref 0.0–2.0)
EOS%: 2.6 % (ref 0.0–7.0)
Eosinophils Absolute: 0.3 10*3/uL (ref 0.0–0.5)
HCT: 38.4 % (ref 34.8–46.6)
HGB: 13 g/dL (ref 11.6–15.9)
LYMPH#: 1.8 10*3/uL (ref 0.9–3.3)
LYMPH%: 18.4 % (ref 14.0–48.0)
MCH: 30.7 pg (ref 26.0–34.0)
MCHC: 33.9 g/dL (ref 32.0–36.0)
MCV: 91 fL (ref 81–101)
MONO#: 0.8 10*3/uL (ref 0.1–0.9)
MONO%: 8 % (ref 0.0–13.0)
NEUT#: 6.8 10*3/uL — ABNORMAL HIGH (ref 1.5–6.5)
NEUT%: 70.7 % (ref 39.6–80.0)
PLATELETS: 234 10*3/uL (ref 145–400)
RBC: 4.23 10*6/uL (ref 3.70–5.32)
RDW: 14.5 % (ref 11.1–15.7)
WBC: 9.6 10*3/uL (ref 3.9–10.0)

## 2015-07-24 LAB — CMP (CANCER CENTER ONLY)
ALK PHOS: 106 U/L — AB (ref 26–84)
ALT: 27 U/L (ref 10–47)
AST: 22 U/L (ref 11–38)
Albumin: 3.4 g/dL (ref 3.3–5.5)
BILIRUBIN TOTAL: 0.6 mg/dL (ref 0.20–1.60)
BUN: 16 mg/dL (ref 7–22)
CO2: 31 meq/L (ref 18–33)
CREATININE: 0.9 mg/dL (ref 0.6–1.2)
Calcium: 9.9 mg/dL (ref 8.0–10.3)
Chloride: 99 mEq/L (ref 98–108)
GLUCOSE: 117 mg/dL (ref 73–118)
Potassium: 3.2 mEq/L — ABNORMAL LOW (ref 3.3–4.7)
SODIUM: 141 meq/L (ref 128–145)
Total Protein: 7.6 g/dL (ref 6.4–8.1)

## 2015-07-24 LAB — LACTATE DEHYDROGENASE: LDH: 178 U/L (ref 125–245)

## 2015-07-24 NOTE — Progress Notes (Signed)
Hematology and Oncology Follow Up Visit  Sheila Young [id number] 11-03-1950 65 y.o. 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 She is doing well and has no complaints at this time. Her energy has improved.  No fever, chills, n/v, cough, rash, dizziness, SOB, chest pain, palpitations, abdominal pain or changes in bowel or bladder habits. [name] is now healing. She has some clear discharge from it at times but there is no odor, erythema or sign of infection. She is keeping it clean and dry,  She is still smoking 3 cigarettes a day. She is trying to quit.  She has had no swelling, tenderness, numbness or tingling in her extremities. No c/o pain.  She is eating well and staying hydrated. She is careful when eating and has not had much difficulty swallowing. No episodes of choking. She is up another 17 lbs since her last visit.   Medications:    Medication List       This list is accurate as of: 07/24/15 11:58 AM.  Always use your most recent med list.               amLODipine 10 MG tablet  Commonly known as:  NORVASC  Take 0.5 tablets (5 mg total) by mouth daily.     atorvastatin 40 MG tablet  Commonly known as:  LIPITOR  Take 40 mg by mouth every morning.     clopidogrel 75 MG tablet  Commonly known as:  PLAVIX  Take 1 tablet (75 mg total) by mouth daily.     fexofenadine 180 MG tablet  Commonly known as:  ALLEGRA  Take 180 mg by mouth as needed.     FLUoxetine 10 MG capsule  Commonly known as:  PROZAC  Take 1 capsule (10 mg total) by mouth daily.     folic acid 1 MG tablet  Commonly known as:  FOLVITE  TAKE ONE TABLET BY MOUTH ONCE DAILY. (ROUND YELLOW TAB WITH V 31/62)     losartan 50 MG tablet  Commonly known as:  COZAAR  Take  50 mg by mouth daily.     magnesium gluconate 500 MG tablet  Commonly known as:  MAGONATE  Take 500 mg by mouth daily. 250 mg daily     pantoprazole 40 MG tablet  Commonly known as:  PROTONIX  Take 1 tablet (40 mg total) by mouth daily.     potassium chloride SA 20 MEQ tablet  Commonly known as:  K-DUR,KLOR-CON  Take 2 tablets (40 mEq total) by mouth once.        Allergies: No Known Allergies  Past Medical History, Surgical history, Social history, and Family History were reviewed and updated.  Review of Systems: All other 10 point review of systems is negative.   Physical Exam:  weight is 143 lb (64.864 kg). Her oral temperature is 98.2 F (36.8 C). Her blood pressure is 145/62 and her pulse is 74. Her respiration is 16.   Wt Readings from Last 3 Encounters:  07/24/15 143 lb (64.864 kg)  03/27/15 126 lb (57.153 kg)  12/19/14 129 lb (58.514 kg)    Ocular: Sclerae unicteric, pupils equal, round and reactive to light Ear-nose-throat: Oropharynx clear, dentition fair Lymphatic: No cervical supraclavicular or axillary adenopathy Lungs no rales  or rhonchi, good excursion bilaterally Heart regular rate and rhythm, no murmur appreciated Abd soft, nontender, positive bowel sounds, no liver or spleen tip palpated on exam, no fluid wave MSK no focal spinal tenderness, no joint edema Neuro: non-focal, well-oriented, appropriate affect Breasts: Deferred  Lab Results  Component Value Date   WBC 9.6 07/24/2015   HGB 13.0 07/24/2015   HCT 38.4 07/24/2015   MCV 91 07/24/2015   PLT 234 07/24/2015   Lab Results  Component Value Date   FERRITIN 345* 04/07/2014   IRON 33* 04/07/2014   TIBC 235* 04/07/2014   UIBC 202 04/07/2014   IRONPCTSAT 14* 04/07/2014   Lab Results  Component Value Date   RETICCTPCT 1.1 04/07/2014   RBC 4.23 07/24/2015   RETICCTABS 43.3 04/07/2014   No results found for: KPAFRELGTCHN, LAMBDASER, KAPLAMBRATIO No results found for: IGGSERUM, IGA,  IGMSERUM No results found for: Odetta Pink, SPEI   Chemistry      Component Value Date/Time   NA 141 07/24/2015 0933   NA 145 03/27/2015 0844   NA 139 12/19/2014 1457   K 3.2* 07/24/2015 0933   K 2.8* 03/27/2015 0844   K 3.2* 12/19/2014 1457   CL 99 07/24/2015 0933   CL 100 12/19/2014 1457   CO2 31 07/24/2015 0933   CO2 32* 03/27/2015 0844   CO2 31 12/19/2014 1457   BUN 16 07/24/2015 0933   BUN 20.2 03/27/2015 0844   BUN 18 12/19/2014 1457   CREATININE 0.9 07/24/2015 0933   CREATININE 1.0 03/27/2015 0844   CREATININE 1.02* 12/19/2014 1457      Component Value Date/Time   CALCIUM 9.9 07/24/2015 0933   CALCIUM 10.2 03/27/2015 0844   CALCIUM 9.3 12/19/2014 1457   ALKPHOS 106* 07/24/2015 0933   ALKPHOS 129 03/27/2015 0844   ALKPHOS 93 12/19/2014 1457   AST 22 07/24/2015 0933   AST 15 03/27/2015 0844   AST 21 12/19/2014 1457   ALT 27 07/24/2015 0933   ALT 13 03/27/2015 0844   ALT 18 12/19/2014 1457   BILITOT 0.60 07/24/2015 0933   BILITOT 0.58 03/27/2015 0844   BILITOT 0.3 12/19/2014 1457     Impression and Plan: Sheila Young is [name] with locally advanced squamous cell carcinoma of the esophagus. She completed treatment with radiation and concurrent chemotherapy in February 2016. So far she has done well and is asymptomatic at this time.   She does not like to have scans done so we will hold off for now unless she becomes symptomatic.  We will plan to see her back in 4 months for labs and follow-up.  Both she and her niece know to contact us with any questions or concerns.  We can certainly see her sooner if need be.   Eliezer Bottom, NP 4/17/201711:58 AM

## 2015-08-11 ENCOUNTER — Other Ambulatory Visit: Payer: Self-pay | Admitting: Nurse Practitioner

## 2015-08-11 DIAGNOSIS — C159 Malignant neoplasm of esophagus, unspecified: Secondary | ICD-10-CM

## 2015-08-11 MED ORDER — FLUOXETINE HCL 10 MG PO CAPS: 10.0000 mg | ORAL_CAPSULE | Freq: Every day | ORAL | Status: DC

## 2015-11-09 ENCOUNTER — Other Ambulatory Visit: Payer: Self-pay | Admitting: *Deleted

## 2015-11-09 DIAGNOSIS — C159 Malignant neoplasm of esophagus, unspecified: Secondary | ICD-10-CM

## 2015-11-09 MED ORDER — CLOPIDOGREL BISULFATE 75 MG PO TABS: 75.0000 mg | ORAL_TABLET | Freq: Every day | ORAL | 4 refills | Status: DC

## 2015-11-09 MED ORDER — MAGNESIUM GLUCONATE 500 MG PO TABS: 500.0000 mg | ORAL_TABLET | Freq: Every day | ORAL | 6 refills | Status: DC

## 2015-11-09 MED ORDER — FOLIC ACID 1 MG PO TABS: ORAL_TABLET | ORAL | 12 refills | Status: DC

## 2015-11-30 ENCOUNTER — Other Ambulatory Visit (HOSPITAL_BASED_OUTPATIENT_CLINIC_OR_DEPARTMENT_OTHER): Payer: Medicare Other

## 2015-11-30 ENCOUNTER — Ambulatory Visit (HOSPITAL_BASED_OUTPATIENT_CLINIC_OR_DEPARTMENT_OTHER): Payer: Medicare Other | Admitting: Family

## 2015-11-30 ENCOUNTER — Encounter: Payer: Self-pay | Admitting: Family

## 2015-11-30 VITALS — BP 148/78 | HR 56 | Temp 98.0°F | Resp 16 | Ht 66.0 in | Wt 150.0 lb

## 2015-11-30 DIAGNOSIS — Z8501 Personal history of malignant neoplasm of esophagus: Secondary | ICD-10-CM | POA: Diagnosis not present

## 2015-11-30 DIAGNOSIS — C154 Malignant neoplasm of middle third of esophagus: Secondary | ICD-10-CM

## 2015-11-30 LAB — CBC WITH DIFFERENTIAL (CANCER CENTER ONLY)
BASO#: 0 10*3/uL (ref 0.0–0.2)
BASO%: 0.4 % (ref 0.0–2.0)
EOS ABS: 0.3 10*3/uL (ref 0.0–0.5)
EOS%: 2.3 % (ref 0.0–7.0)
HEMATOCRIT: 38.3 % (ref 34.8–46.6)
HEMOGLOBIN: 13.1 g/dL (ref 11.6–15.9)
LYMPH#: 2.4 10*3/uL (ref 0.9–3.3)
LYMPH%: 21.8 % (ref 14.0–48.0)
MCH: 30.9 pg (ref 26.0–34.0)
MCHC: 34.2 g/dL (ref 32.0–36.0)
MCV: 90 fL (ref 81–101)
MONO#: 0.7 10*3/uL (ref 0.1–0.9)
MONO%: 6.3 % (ref 0.0–13.0)
NEUT#: 7.4 10*3/uL — ABNORMAL HIGH (ref 1.5–6.5)
NEUT%: 69.2 % (ref 39.6–80.0)
Platelets: 229 10*3/uL (ref 145–400)
RBC: 4.24 10*6/uL (ref 3.70–5.32)
RDW: 14.6 % (ref 11.1–15.7)
WBC: 10.8 10*3/uL — ABNORMAL HIGH (ref 3.9–10.0)

## 2015-11-30 LAB — COMPREHENSIVE METABOLIC PANEL
ALBUMIN: 3.7 g/dL (ref 3.5–5.0)
ALK PHOS: 129 U/L (ref 40–150)
ALT: 14 U/L (ref 0–55)
ANION GAP: 8 meq/L (ref 3–11)
AST: 18 U/L (ref 5–34)
BUN: 29.6 mg/dL — ABNORMAL HIGH (ref 7.0–26.0)
CALCIUM: 10.2 mg/dL (ref 8.4–10.4)
CO2: 29 mEq/L (ref 22–29)
Chloride: 107 mEq/L (ref 98–109)
Creatinine: 1.2 mg/dL — ABNORMAL HIGH (ref 0.6–1.1)
EGFR: 53 mL/min/{1.73_m2} — AB (ref >90)
Glucose: 104 mg/dl (ref 70–140)
Potassium: 3.5 mEq/L (ref 3.5–5.1)
Sodium: 144 mEq/L (ref 136–145)
Total Bilirubin: 0.45 mg/dL (ref 0.20–1.20)
Total Protein: 7.9 g/dL (ref 6.4–8.3)

## 2015-11-30 LAB — LACTATE DEHYDROGENASE: LDH: 160 U/L (ref 125–245)

## 2015-11-30 NOTE — Progress Notes (Signed)
Hematology and Oncology Follow Up Visit  Sheila Young [id number] April 07, 1951 65 y.o. 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 She continues to get along nicely and has no complaints at this time.  No fever, chills, n/v, cough, rash, dizziness, SOB, chest pain, palpitations, abdominal pain or changes in bowel or bladder habits. [name] is still healing. She has some blood around the site where it appears she has scratched it. I cleaned the area with chlorhexidine and covered with a clean bandage. We will have her keep this area clean and dry and put neosporin on BID. She will follow-up with radiology who removed the tube if the area becomes inflamed and shows s/s of infection.  She is still smoking 4-5 cigarettes a day but is trying to quit.  She has had no swelling, tenderness, numbness or tingling in her extremities. No c/o pain.  She is eating well, no problems swallowing, and staying hydrated. No episodes of choking.  Medications:    Medication List       Accurate as of 11/30/15  9:36 AM. Always use your most recent med list.          amLODipine 10 MG tablet Commonly known as:  NORVASC Take 0.5 tablets (5 mg total) by mouth daily.   atorvastatin 40 MG tablet Commonly known as:  LIPITOR Take 40 mg by mouth every morning.   clopidogrel 75 MG tablet Commonly known as:  PLAVIX Take 1 tablet (75 mg total) by mouth daily.   fexofenadine 180 MG tablet Commonly known as:  ALLEGRA Take 180 mg by mouth as needed.   FLUoxetine 10 MG capsule Commonly known as:  PROZAC Take 1 capsule (10 mg total) by mouth daily.   folic acid 1 MG tablet Commonly known as:  FOLVITE TAKE ONE TABLET BY MOUTH ONCE DAILY. (ROUND YELLOW TAB WITH V 31/62)     losartan 50 MG tablet Commonly known as:  COZAAR Take 50 mg by mouth daily.   magnesium gluconate 500 MG tablet Commonly known as:  MAGONATE Take 1 tablet (500 mg total) by mouth daily. 250 mg daily   pantoprazole 40 MG tablet Commonly known as:  PROTONIX Take 1 tablet (40 mg total) by mouth daily.   potassium chloride SA 20 MEQ tablet Commonly known as:  K-DUR,KLOR-CON Take 2 tablets (40 mEq total) by mouth once.       Allergies: No Known Allergies  Past Medical History, Surgical history, Social history, and Family History were reviewed and updated.  Review of Systems: All other 10 point review of systems is negative.   Physical Exam:  vitals were not taken for this visit.  Wt Readings from Last 3 Encounters:  07/24/15 143 lb (64.9 kg)  03/27/15 126 lb (57.2 kg)  12/19/14 129 lb (58.5 kg)    Ocular: Sclerae unicteric, pupils equal, round and reactive to light Ear-nose-throat: Oropharynx clear, dentition fair Lymphatic: No cervical supraclavicular or axillary adenopathy Lungs no rales or rhonchi, good excursion bilaterally Heart regular rate and rhythm, no murmur appreciated Abd soft, nontender, positive bowel sounds, no liver or spleen tip palpated on exam, no fluid wave MSK no focal spinal tenderness, no joint edema Neuro: non-focal, well-oriented, appropriate affect Breasts: Deferred  Lab Results  Component Value Date  WBC 10.8 (H) 11/30/2015   HGB 13.1 11/30/2015   HCT 38.3 11/30/2015   MCV 90 11/30/2015   PLT 229 11/30/2015   Lab Results  Component Value Date   FERRITIN 345 (H) 04/07/2014   IRON 33 (L) 04/07/2014   TIBC 235 (L) 04/07/2014   UIBC 202 04/07/2014   IRONPCTSAT 14 (L) 04/07/2014   Lab Results  Component Value Date   RETICCTPCT 1.1 04/07/2014   RBC 4.24 11/30/2015   RETICCTABS 43.3 04/07/2014   No results found for: KPAFRELGTCHN, LAMBDASER, KAPLAMBRATIO No results found for: IGGSERUM, IGA, IGMSERUM No results found for:  Odetta Pink, SPEI   Chemistry      Component Value Date/Time   NA 141 07/24/2015 0933   NA 145 03/27/2015 0844   K 3.2 (L) 07/24/2015 0933   K 2.8 (LL) 03/27/2015 0844   CL 99 07/24/2015 0933   CO2 31 07/24/2015 0933   CO2 32 (H) 03/27/2015 0844   BUN 16 07/24/2015 0933   BUN 20.2 03/27/2015 0844   CREATININE 0.9 07/24/2015 0933   CREATININE 1.0 03/27/2015 0844      Component Value Date/Time   CALCIUM 9.9 07/24/2015 0933   CALCIUM 10.2 03/27/2015 0844   ALKPHOS 106 (H) 07/24/2015 0933   ALKPHOS 129 03/27/2015 0844   AST 22 07/24/2015 0933   AST 15 03/27/2015 0844   ALT 27 07/24/2015 0933   ALT 13 03/27/2015 0844   BILITOT 0.60 07/24/2015 0933   BILITOT 0.58 03/27/2015 0844     Impression and Plan: Sheila Young is [name] with locally advanced squamous cell carcinoma of the esophagus. She completed treatment with radiation and concurrent chemotherapy in February 2016. She continues to do well and so far there has been no evidence of recurrence. She does not like having scans done so we will hold off for now unless she becomes symptomatic. We will continue to follow along with her and plan to see her back in 4 months for labs and follow-up.  Both she and her niece know to contact us with any questions or concerns.  We can certainly see her sooner if need be.   Eliezer Bottom, NP 8/24/20179:36 AM

## 2016-01-11 ENCOUNTER — Other Ambulatory Visit: Payer: Self-pay | Admitting: *Deleted

## 2016-01-11 DIAGNOSIS — C159 Malignant neoplasm of esophagus, unspecified: Secondary | ICD-10-CM

## 2016-01-11 MED ORDER — FLUOXETINE HCL 10 MG PO CAPS: 10.0000 mg | ORAL_CAPSULE | Freq: Every day | ORAL | 4 refills | Status: DC

## 2016-03-12 ENCOUNTER — Other Ambulatory Visit: Payer: Self-pay | Admitting: *Deleted

## 2016-03-12 DIAGNOSIS — E876 Hypokalemia: Secondary | ICD-10-CM

## 2016-03-12 DIAGNOSIS — C154 Malignant neoplasm of middle third of esophagus: Secondary | ICD-10-CM

## 2016-03-12 MED ORDER — POTASSIUM CHLORIDE CRYS ER 20 MEQ PO TBCR: 40.0000 meq | EXTENDED_RELEASE_TABLET | Freq: Every day | ORAL | 12 refills | Status: DC

## 2016-04-03 ENCOUNTER — Ambulatory Visit: Payer: Medicare Other | Admitting: Family

## 2016-04-03 ENCOUNTER — Other Ambulatory Visit: Payer: Self-pay | Admitting: Nurse Practitioner

## 2016-04-03 ENCOUNTER — Other Ambulatory Visit: Payer: Medicare Other

## 2016-06-17 ENCOUNTER — Other Ambulatory Visit: Payer: Medicare Other

## 2016-06-17 ENCOUNTER — Ambulatory Visit: Payer: Medicare Other | Admitting: Family

## 2016-06-18 ENCOUNTER — Telehealth: Payer: Self-pay | Admitting: *Deleted

## 2016-06-18 ENCOUNTER — Ambulatory Visit (HOSPITAL_BASED_OUTPATIENT_CLINIC_OR_DEPARTMENT_OTHER): Payer: 59 | Admitting: Family

## 2016-06-18 ENCOUNTER — Other Ambulatory Visit (HOSPITAL_BASED_OUTPATIENT_CLINIC_OR_DEPARTMENT_OTHER): Payer: 59

## 2016-06-18 ENCOUNTER — Encounter: Payer: Self-pay | Admitting: Family

## 2016-06-18 VITALS — BP 173/73 | HR 64 | Temp 98.0°F | Resp 20 | Wt 161.0 lb

## 2016-06-18 DIAGNOSIS — Z8501 Personal history of malignant neoplasm of esophagus: Secondary | ICD-10-CM | POA: Diagnosis not present

## 2016-06-18 DIAGNOSIS — C154 Malignant neoplasm of middle third of esophagus: Secondary | ICD-10-CM

## 2016-06-18 LAB — COMPREHENSIVE METABOLIC PANEL
ALBUMIN: 3.7 g/dL (ref 3.5–5.0)
ALK PHOS: 115 U/L (ref 40–150)
ALT: 14 U/L (ref 0–55)
ANION GAP: 10 meq/L (ref 3–11)
AST: 17 U/L (ref 5–34)
BILIRUBIN TOTAL: 0.44 mg/dL (ref 0.20–1.20)
BUN: 23.4 mg/dL (ref 7.0–26.0)
CALCIUM: 9.7 mg/dL (ref 8.4–10.4)
CHLORIDE: 104 meq/L (ref 98–109)
CO2: 29 mEq/L (ref 22–29)
CREATININE: 1.1 mg/dL (ref 0.6–1.1)
EGFR: 61 mL/min/{1.73_m2} — ABNORMAL LOW (ref >90)
Glucose: 89 mg/dl (ref 70–140)
Potassium: 2.9 mEq/L — CL (ref 3.5–5.1)
Sodium: 143 mEq/L (ref 136–145)
TOTAL PROTEIN: 7.6 g/dL (ref 6.4–8.3)

## 2016-06-18 LAB — CBC WITH DIFFERENTIAL (CANCER CENTER ONLY)
BASO#: 0 10*3/uL (ref 0.0–0.2)
BASO%: 0.4 % (ref 0.0–2.0)
EOS ABS: 0.3 10*3/uL (ref 0.0–0.5)
EOS%: 2.7 % (ref 0.0–7.0)
HCT: 35.8 % (ref 34.8–46.6)
HGB: 12.2 g/dL (ref 11.6–15.9)
LYMPH#: 2 10*3/uL (ref 0.9–3.3)
LYMPH%: 22 % (ref 14.0–48.0)
MCH: 30.6 pg (ref 26.0–34.0)
MCHC: 34.1 g/dL (ref 32.0–36.0)
MCV: 90 fL (ref 81–101)
MONO#: 0.8 10*3/uL (ref 0.1–0.9)
MONO%: 8.2 % (ref 0.0–13.0)
NEUT#: 6.1 10*3/uL (ref 1.5–6.5)
NEUT%: 66.7 % (ref 39.6–80.0)
PLATELETS: 200 10*3/uL (ref 145–400)
RBC: 3.99 10*6/uL (ref 3.70–5.32)
RDW: 14.9 % (ref 11.1–15.7)
WBC: 9.2 10*3/uL (ref 3.9–10.0)

## 2016-06-18 LAB — LACTATE DEHYDROGENASE: LDH: 195 U/L (ref 125–245)

## 2016-06-18 NOTE — Progress Notes (Signed)
Hematology and Oncology Follow Up Visit  Gaila Engebretsen [id number] 1951/01/14 66 y.o. 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 Veneziano is here today for a follow-up. She continues to do well and has no complaints at this time. She states that she "feels good." No fever, chills, n/v, cough, rash, dizziness, SOB, chest pain, palpitations, abdominal pain or changes in bowel or bladder habits. [name] has finally healed. She is no longer having drainage.  She is still smoking 4 cigarettes a day but is trying to quit. She would like to get back into bowling or the gym for exercise. She states that this may help her quit.  No lymphadenopathy found on exam. No episodes of bleeding, bruising or petechiae.  No swelling, tenderness, numbness or tingling in her extremities. No c/o joint aches or bone pain.  She has maintained a good appetite and has had no problems swallowing. She is staying well hydrated. No episodes of choking.  Medications:  Allergies as of 06/18/2016   No Known Allergies     Medication List       Accurate as of 06/18/16  1:02 PM. Always use your most recent med list.          amLODipine 10 MG tablet Commonly known as:  NORVASC Take 0.5 tablets (5 mg total) by mouth daily.   atorvastatin 40 MG tablet Commonly known as:  LIPITOR Take 40 mg by mouth every morning.   clopidogrel 75 MG tablet Commonly known as:  PLAVIX Take 1 tablet (75 mg total) by mouth daily.   fexofenadine 180 MG tablet Commonly known as:  ALLEGRA Take 180 mg by mouth as needed.   FLUoxetine 10 MG capsule Commonly known as:  PROZAC Take 1 capsule (10 mg total) by mouth daily.   folic acid 1 MG tablet Commonly known as:  FOLVITE TAKE ONE TABLET BY MOUTH ONCE DAILY. (ROUND YELLOW TAB WITH V 31/62)   GOODSENSE  ASPIRIN 325 MG tablet Generic drug:  aspirin Take 325 mg by mouth.   levETIRAcetam 500 MG tablet Commonly known as:  KEPPRA Take 500 mg by mouth.   losartan 50 MG tablet Commonly known as:  COZAAR Take 50 mg by mouth daily.   magnesium gluconate 500 MG tablet Commonly known as:  MAGONATE Take 1 tablet (500 mg total) by mouth daily. 250 mg daily   pantoprazole 40 MG tablet Commonly known as:  PROTONIX Take 1 tablet (40 mg total) by mouth daily.   potassium chloride SA 20 MEQ tablet Commonly known as:  K-DUR,KLOR-CON Take 2 tablets (40 mEq total) by mouth daily.       Allergies: No Known Allergies  Past Medical History, Surgical history, Social history, and Family History were reviewed and updated.  Review of Systems: All other 10 point review of systems is negative.   Physical Exam:  weight is 161 lb (73 kg). Her oral temperature is 98 F (36.7 C). Her blood pressure is 173/73 (abnormal) and her pulse is 64. Her respiration is 20 and oxygen saturation is 100%.   Wt Readings from Last 3 Encounters:  06/18/16 161 lb (73 kg)  11/30/15 150 lb (68 kg)  07/24/15 143 lb (64.9 kg)    Ocular: Sclerae unicteric, pupils equal, round and reactive to light Ear-nose-throat: Oropharynx clear, dentition fair Lymphatic: No cervical supraclavicular  or axillary adenopathy Lungs no rales or rhonchi, good excursion bilaterally Heart regular rate and rhythm, no murmur appreciated Abd soft, nontender, positive bowel sounds, no liver or spleen tip palpated on exam, no fluid wave MSK no focal spinal tenderness, no joint edema Neuro: non-focal, well-oriented, appropriate affect Breasts: Deferred  Lab Results  Component Value Date   WBC 9.2 06/18/2016   HGB 12.2 06/18/2016   HCT 35.8 06/18/2016   MCV 90 06/18/2016   PLT 200 06/18/2016   Lab Results  Component Value Date   FERRITIN 345 (H) 04/07/2014   IRON 33 (L) 04/07/2014   TIBC 235 (L) 04/07/2014   UIBC 202 04/07/2014    IRONPCTSAT 14 (L) 04/07/2014   Lab Results  Component Value Date   RETICCTPCT 1.1 04/07/2014   RBC 3.99 06/18/2016   RETICCTABS 43.3 04/07/2014   No results found for: KPAFRELGTCHN, LAMBDASER, KAPLAMBRATIO No results found for: IGGSERUM, IGA, IGMSERUM No results found for: Ronnald Ramp, A1GS, Nelida Meuse, SPEI   Chemistry      Component Value Date/Time   NA 144 11/30/2015 0919   K 3.5 11/30/2015 0919   CL 99 07/24/2015 0933   CO2 29 11/30/2015 0919   BUN 29.6 (H) 11/30/2015 0919   CREATININE 1.2 (H) 11/30/2015 0919      Component Value Date/Time   CALCIUM 10.2 11/30/2015 0919   ALKPHOS 129 11/30/2015 0919   AST 18 11/30/2015 0919   ALT 14 11/30/2015 0919   BILITOT 0.45 11/30/2015 0919     Impression and Plan: Ms. Coakley is a pleasant 66 yo African American female with locally advanced squamous cell carcinoma of the esophagus. She completed treatment with radiation and concurrent chemotherapy in February 2016. She is in good spirits today and has no complaints. She is asymptomatic at this time and CBC is stable.  She does not like having scans done so we will continues to hold off for now unless she becomes symptomatic. We will plan to see her back in 6 months for repeat lab work and follow-up.  Both she and her family are good to get in touch with our office with any questions or concerns. We can certainly see her sooner if need be.   Eliezer Bottom, NP 3/13/20181:02 PM

## 2016-06-18 NOTE — Telephone Encounter (Signed)
Critical Value Potassium 2.9 Laverna Peace NP Notified. No orders at this time

## 2016-08-13 ENCOUNTER — Other Ambulatory Visit: Payer: Self-pay | Admitting: *Deleted

## 2016-08-13 DIAGNOSIS — C159 Malignant neoplasm of esophagus, unspecified: Secondary | ICD-10-CM

## 2016-08-13 MED ORDER — FLUOXETINE HCL 10 MG PO CAPS: 10.0000 mg | ORAL_CAPSULE | Freq: Every day | ORAL | 4 refills | Status: DC

## 2016-12-11 ENCOUNTER — Other Ambulatory Visit: Payer: Self-pay | Admitting: *Deleted

## 2016-12-11 MED ORDER — FOLIC ACID 1 MG PO TABS: ORAL_TABLET | ORAL | 12 refills | Status: DC

## 2016-12-20 ENCOUNTER — Ambulatory Visit: Payer: Medicare Other | Admitting: Hematology & Oncology

## 2016-12-20 ENCOUNTER — Other Ambulatory Visit: Payer: Medicare Other

## 2016-12-30 ENCOUNTER — Ambulatory Visit: Payer: Medicare Other | Admitting: Family

## 2016-12-30 ENCOUNTER — Other Ambulatory Visit: Payer: Medicare Other

## 2017-02-04 ENCOUNTER — Other Ambulatory Visit (HOSPITAL_BASED_OUTPATIENT_CLINIC_OR_DEPARTMENT_OTHER): Payer: Medicare Other

## 2017-02-04 ENCOUNTER — Ambulatory Visit (HOSPITAL_BASED_OUTPATIENT_CLINIC_OR_DEPARTMENT_OTHER): Payer: Medicare Other | Admitting: Family

## 2017-02-04 VITALS — BP 140/78 | HR 72 | Temp 97.9°F | Resp 16 | Wt 156.0 lb

## 2017-02-04 DIAGNOSIS — D649 Anemia, unspecified: Secondary | ICD-10-CM

## 2017-02-04 DIAGNOSIS — C154 Malignant neoplasm of middle third of esophagus: Secondary | ICD-10-CM

## 2017-02-04 DIAGNOSIS — K219 Gastro-esophageal reflux disease without esophagitis: Secondary | ICD-10-CM | POA: Diagnosis not present

## 2017-02-04 DIAGNOSIS — Z720 Tobacco use: Secondary | ICD-10-CM | POA: Diagnosis not present

## 2017-02-04 DIAGNOSIS — Z8501 Personal history of malignant neoplasm of esophagus: Secondary | ICD-10-CM

## 2017-02-04 LAB — COMPREHENSIVE METABOLIC PANEL (CC13)
A/G RATIO: 1 — AB (ref 1.2–2.2)
ALK PHOS: 166 IU/L — AB (ref 39–117)
ALT: 26 IU/L (ref 0–32)
AST: 23 IU/L (ref 0–40)
Albumin, Serum: 3.9 g/dL (ref 3.6–4.8)
BUN/Creatinine Ratio: 18 (ref 12–28)
BUN: 22 mg/dL (ref 8–27)
Bilirubin Total: 0.2 mg/dL (ref 0.0–1.2)
CO2: 27 mmol/L (ref 20–29)
Calcium, Ser: 10.4 mg/dL — ABNORMAL HIGH (ref 8.7–10.3)
Chloride, Ser: 103 mmol/L (ref 96–106)
Creatinine, Ser: 1.19 mg/dL — ABNORMAL HIGH (ref 0.57–1.00)
GFR calc Af Amer: 55 mL/min/{1.73_m2} — ABNORMAL LOW (ref >59)
GFR calc non Af Amer: 48 mL/min/{1.73_m2} — ABNORMAL LOW (ref >59)
GLOBULIN, TOTAL: 4.1 g/dL (ref 1.5–4.5)
Glucose: 85 mg/dL (ref 65–99)
POTASSIUM: 3.9 mmol/L (ref 3.5–5.2)
SODIUM: 142 mmol/L (ref 134–144)
Total Protein: 8 g/dL (ref 6.0–8.5)

## 2017-02-04 LAB — LACTATE DEHYDROGENASE: LDH: 186 U/L (ref 125–245)

## 2017-02-04 LAB — CBC WITH DIFFERENTIAL (CANCER CENTER ONLY)
BASO#: 0 10*3/uL (ref 0.0–0.2)
BASO%: 0.4 % (ref 0.0–2.0)
EOS%: 5.3 % (ref 0.0–7.0)
Eosinophils Absolute: 0.5 10*3/uL (ref 0.0–0.5)
HCT: 33.4 % — ABNORMAL LOW (ref 34.8–46.6)
HGB: 10.8 g/dL — ABNORMAL LOW (ref 11.6–15.9)
LYMPH#: 2.3 10*3/uL (ref 0.9–3.3)
LYMPH%: 24 % (ref 14.0–48.0)
MCH: 29.1 pg (ref 26.0–34.0)
MCHC: 32.3 g/dL (ref 32.0–36.0)
MCV: 90 fL (ref 81–101)
MONO#: 0.9 10*3/uL (ref 0.1–0.9)
MONO%: 9 % (ref 0.0–13.0)
NEUT#: 5.8 10*3/uL (ref 1.5–6.5)
NEUT%: 61.3 % (ref 39.6–80.0)
PLATELETS: 358 10*3/uL (ref 145–400)
RBC: 3.71 10*6/uL (ref 3.70–5.32)
RDW: 15.9 % — ABNORMAL HIGH (ref 11.1–15.7)
WBC: 9.5 10*3/uL (ref 3.9–10.0)

## 2017-02-04 LAB — IRON AND TIBC
%SAT: 22 % (ref 21–57)
IRON: 61 ug/dL (ref 41–142)
TIBC: 278 ug/dL (ref 236–444)
UIBC: 217 ug/dL (ref 120–384)

## 2017-02-04 NOTE — Progress Notes (Signed)
Hematology and Oncology Follow Up Visit  Sheila Young [id number] Aug 16, 1950 66 y.o. 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 She is doing well and is now living with her sister. She has had some recent fatigue and Hgb is 10.8 with MCV of 90. I have added iron studies to her lab work today.  She is on Protonix for GERD which soul certainly block the absorption of iron.  She denies having any episodes of bleeding, no bruising or petechiae.  She recently had oral thrush develop and her ENT placed her on Nystatin swish and swallow. This appears to have resolved on exam today.  She still has her port in and this has not been flushed in quite a while. We will get her set up with IR for removal.  She has home health, PT and speech therapy working with her weekly and is doing well.  No fever, chills, n/v, cough, rash, dizziness, SOB, chest pain, palpitations, abdominal pain or changes in bowel or bladder habits.  She is still smoking 3-4 cigarettes a day.  She sees a urologist on November 3rd for incontinence.  No swelling, tenderness, numbness or tingling in her extremities. No c/o pain.  She has maintained a good appetite and is staying well hydrated. She denies having difficult swallowing. Her weight is stable.   ECOG Performance Status: 0 - Asymptomatic  Medications:  Allergies as of 02/04/2017   No Known Allergies     Medication List       Accurate as of 02/04/17  2:04 PM. Always use your most recent med list.          amLODipine 10 MG tablet Commonly known as:  NORVASC Take 0.5 tablets (5 mg total) by mouth daily.   atorvastatin 40 MG tablet Commonly known as:  LIPITOR Take 40 mg by mouth every morning.   clopidogrel 75 MG tablet Commonly known as:   PLAVIX Take 1 tablet (75 mg total) by mouth daily.   fexofenadine 180 MG tablet Commonly known as:  ALLEGRA Take 180 mg by mouth as needed.   FLUoxetine 10 MG capsule Commonly known as:  PROZAC Take 1 capsule (10 mg total) by mouth daily.   folic acid 1 MG tablet Commonly known as:  FOLVITE TAKE ONE TABLET BY MOUTH ONCE DAILY. (ROUND YELLOW TAB WITH V 31/62)   levETIRAcetam 500 MG tablet Commonly known as:  KEPPRA Take 500 mg by mouth.   losartan 50 MG tablet Commonly known as:  COZAAR Take 50 mg by mouth daily.   magnesium gluconate 500 MG tablet Commonly known as:  MAGONATE Take 1 tablet (500 mg total) by mouth daily. 250 mg daily   pantoprazole 40 MG tablet Commonly known as:  PROTONIX Take 1 tablet (40 mg total) by mouth daily.   potassium chloride SA 20 MEQ tablet Commonly known as:  K-DUR,KLOR-CON Take 2 tablets (40 mEq total) by mouth daily.       Allergies: No Known Allergies  Past Medical History, Surgical history, Social history, and Family History were reviewed and updated.  Review of Systems: All other 10 point review of systems is negative.   Physical Exam:  vitals were not taken for this visit.  Wt Readings from Last 3 Encounters:  06/18/16 161 lb (73 kg)  11/30/15 150 lb (  68 kg)  07/24/15 143 lb (64.9 kg)    Ocular: Sclerae unicteric, pupils equal, round and reactive to light Ear-nose-throat: Oropharynx clear, dentition fair Lymphatic: No cervical, supraclavicular or axillary adenopathy Lungs no rales or rhonchi, good excursion bilaterally Heart regular rate and rhythm, no murmur appreciated Abd soft, nontender, positive bowel sounds, no liver or spleen tip palpated on exam, no fluid wave  MSK no focal spinal tenderness, no joint edema Neuro: non-focal, well-oriented, appropriate affect Breasts:Deferred    Lab Results  Component Value Date   WBC 9.2 06/18/2016   HGB 12.2 06/18/2016   HCT 35.8 06/18/2016   MCV 90 06/18/2016   PLT 200  06/18/2016   Lab Results  Component Value Date   FERRITIN 345 (H) 04/07/2014   IRON 33 (L) 04/07/2014   TIBC 235 (L) 04/07/2014   UIBC 202 04/07/2014   IRONPCTSAT 14 (L) 04/07/2014   Lab Results  Component Value Date   RETICCTPCT 1.1 04/07/2014   RBC 3.99 06/18/2016   RETICCTABS 43.3 04/07/2014   No results found for: KPAFRELGTCHN, LAMBDASER, KAPLAMBRATIO No results found for: IGGSERUM, IGA, IGMSERUM No results found for: Odetta Pink, SPEI   Chemistry      Component Value Date/Time   NA 143 06/18/2016 1118   K 2.9 (LL) 06/18/2016 1118   CL 99 07/24/2015 0933   CO2 29 06/18/2016 1118   BUN 23.4 06/18/2016 1118   CREATININE 1.1 06/18/2016 1118      Component Value Date/Time   CALCIUM 9.7 06/18/2016 1118   ALKPHOS 115 06/18/2016 1118   AST 17 06/18/2016 1118   ALT 14 06/18/2016 1118   BILITOT 0.44 06/18/2016 1118      Impression and Plan: Sheila Young is a very pleasant 66 yo African American female with history of locally advanced squamous cell carcinoma of the esophagus. She completed treatment with radiation and concurrent chemo in February 2016. She is now living with her sister and doing quite well. She has no complaints at this time.  We will set her up with IR to have her port removed in the next week.  We will repeat scans if she develops any symptoms. She really does not like to have these done.  We will plan to see her back again in another 6 months for follow-up and lab.  Both she and her family both know to contact our office with any questions or concerns. We can certainly see her sooner if need be.    Eliezer Bottom, NP 10/30/20182:04 PM

## 2017-02-05 LAB — FERRITIN: Ferritin: 417 ng/ml — ABNORMAL HIGH (ref 9–269)

## 2017-02-14 ENCOUNTER — Other Ambulatory Visit: Payer: Self-pay | Admitting: Radiology

## 2017-02-18 ENCOUNTER — Encounter (HOSPITAL_COMMUNITY): Payer: Self-pay

## 2017-02-18 ENCOUNTER — Ambulatory Visit (HOSPITAL_COMMUNITY)
Admission: RE | Admit: 2017-02-18 | Discharge: 2017-02-18 | Disposition: A | Discharge status: Home / Self Care | Payer: Medicare Other | Source: Ambulatory Visit | Attending: Family | Admitting: Family

## 2017-02-18 DIAGNOSIS — E7800 Pure hypercholesterolemia, unspecified: Secondary | ICD-10-CM | POA: Insufficient documentation

## 2017-02-18 DIAGNOSIS — F17210 Nicotine dependence, cigarettes, uncomplicated: Secondary | ICD-10-CM | POA: Diagnosis not present

## 2017-02-18 DIAGNOSIS — I10 Essential (primary) hypertension: Secondary | ICD-10-CM | POA: Insufficient documentation

## 2017-02-18 DIAGNOSIS — Z7902 Long term (current) use of antithrombotics/antiplatelets: Secondary | ICD-10-CM | POA: Diagnosis not present

## 2017-02-18 DIAGNOSIS — C154 Malignant neoplasm of middle third of esophagus: Secondary | ICD-10-CM

## 2017-02-18 DIAGNOSIS — Z8501 Personal history of malignant neoplasm of esophagus: Secondary | ICD-10-CM | POA: Insufficient documentation

## 2017-02-18 DIAGNOSIS — Z923 Personal history of irradiation: Secondary | ICD-10-CM | POA: Diagnosis not present

## 2017-02-18 DIAGNOSIS — D649 Anemia, unspecified: Secondary | ICD-10-CM

## 2017-02-18 DIAGNOSIS — Z8673 Personal history of transient ischemic attack (TIA), and cerebral infarction without residual deficits: Secondary | ICD-10-CM | POA: Insufficient documentation

## 2017-02-18 DIAGNOSIS — Z452 Encounter for adjustment and management of vascular access device: Secondary | ICD-10-CM | POA: Insufficient documentation

## 2017-02-18 HISTORY — PX: IR REMOVAL TUN ACCESS W/ PORT W/O FL MOD SED: IMG2290

## 2017-02-18 LAB — CBC WITH DIFFERENTIAL/PLATELET
BASOS ABS: 0.1 10*3/uL (ref 0.0–0.1)
BASOS PCT: 1 %
Eosinophils Absolute: 0.4 10*3/uL (ref 0.0–0.7)
Eosinophils Relative: 4 %
HEMATOCRIT: 33.1 % — AB (ref 36.0–46.0)
HEMOGLOBIN: 10.7 g/dL — AB (ref 12.0–15.0)
Lymphocytes Relative: 27 %
Lymphs Abs: 2.4 10*3/uL (ref 0.7–4.0)
MCH: 28.3 pg (ref 26.0–34.0)
MCHC: 32.3 g/dL (ref 30.0–36.0)
MCV: 87.6 fL (ref 78.0–100.0)
MONOS PCT: 6 %
Monocytes Absolute: 0.6 10*3/uL (ref 0.1–1.0)
NEUTROS ABS: 5.5 10*3/uL (ref 1.7–7.7)
NEUTROS PCT: 62 %
Platelets: 223 10*3/uL (ref 150–400)
RBC: 3.78 MIL/uL — ABNORMAL LOW (ref 3.87–5.11)
RDW: 16.2 % — AB (ref 11.5–15.5)
WBC: 8.8 10*3/uL (ref 4.0–10.5)

## 2017-02-18 LAB — BASIC METABOLIC PANEL
ANION GAP: 8 (ref 5–15)
BUN: 23 mg/dL — ABNORMAL HIGH (ref 6–20)
CALCIUM: 9.4 mg/dL (ref 8.9–10.3)
CO2: 28 mmol/L (ref 22–32)
Chloride: 107 mmol/L (ref 101–111)
Creatinine, Ser: 1.18 mg/dL — ABNORMAL HIGH (ref 0.44–1.00)
GFR calc non Af Amer: 47 mL/min — ABNORMAL LOW (ref >60)
GFR, EST AFRICAN AMERICAN: 55 mL/min — AB (ref >60)
Glucose, Bld: 100 mg/dL — ABNORMAL HIGH (ref 65–99)
Potassium: 3 mmol/L — ABNORMAL LOW (ref 3.5–5.1)
Sodium: 143 mmol/L (ref 135–145)

## 2017-02-18 LAB — PROTIME-INR
INR: 1.03
Prothrombin Time: 13.4 seconds (ref 11.4–15.2)

## 2017-02-18 MED ORDER — FENTANYL CITRATE (PF) 100 MCG/2ML IJ SOLN
INTRAMUSCULAR | Status: AC | PRN
  Administered 2017-02-18 (×2): 50 ug via INTRAVENOUS

## 2017-02-18 MED ORDER — FENTANYL CITRATE (PF) 100 MCG/2ML IJ SOLN
INTRAMUSCULAR | Status: AC
  Filled 2017-02-18: qty 2

## 2017-02-18 MED ORDER — LIDOCAINE-EPINEPHRINE (PF) 2 %-1:200000 IJ SOLN
INTRAMUSCULAR | Status: AC
  Filled 2017-02-18: qty 20

## 2017-02-18 MED ORDER — SODIUM CHLORIDE 0.9 % IV SOLN
INTRAVENOUS | Status: DC
  Administered 2017-02-18: 10:00:00 via INTRAVENOUS

## 2017-02-18 MED ORDER — LIDOCAINE-EPINEPHRINE (PF) 1 %-1:200000 IJ SOLN: INTRAMUSCULAR | Status: AC | PRN

## 2017-02-18 MED ORDER — LIDOCAINE-EPINEPHRINE (PF) 2 %-1:200000 IJ SOLN
INTRAMUSCULAR | Status: AC | PRN
  Administered 2017-02-18: 10 mL

## 2017-02-18 MED ORDER — CEFAZOLIN SODIUM-DEXTROSE 2-4 GM/100ML-% IV SOLN
INTRAVENOUS | Status: AC
  Administered 2017-02-18: 2 g via INTRAVENOUS
  Filled 2017-02-18: qty 100

## 2017-02-18 MED ORDER — MIDAZOLAM HCL 2 MG/2ML IJ SOLN
INTRAMUSCULAR | Status: AC | PRN
  Administered 2017-02-18 (×2): 1 mg via INTRAVENOUS

## 2017-02-18 MED ORDER — CEFAZOLIN SODIUM-DEXTROSE 2-4 GM/100ML-% IV SOLN
2.0000 g | INTRAVENOUS | Status: AC
  Administered 2017-02-18: 2 g via INTRAVENOUS

## 2017-02-18 MED ORDER — MIDAZOLAM HCL 2 MG/2ML IJ SOLN
INTRAMUSCULAR | Status: AC
  Filled 2017-02-18: qty 2

## 2017-02-18 NOTE — H&P (Signed)
Chief Complaint: Patient was seen in consultation today for port removal at the request of [place] M  Referring Physician(s): [place] M  Supervising Physician: Sandi Mariscal  Patient Status: [place]  History of Present Illness: Sheila Young is a 66 y.o. female with hx of esophageal cancer. Sheila Young had port and G-tube placed on 04/12/2014 by Dr. Earleen Newport and has undergone treatment. Sheila Young has had Sheila Young G-tube removed in the interim. Sheila Young is here for port removal. Sheila Young reports Sheila Young never had any issues with Sheila Young port. PMHx, meds, labs, meds, allergies reviewed. Sheila Young stopped taking Sheila Young Plavix about a week ago as instructed. Feels well this am, has been NPO.   Past Medical History:  Diagnosis Date  . Anger   . Esophagus cancer ([place]) 04/07/2014  . 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 History of noncompliance with medical treatment   . Hypercholesteremia   . Hypertension   . Hypokalemia   . Mammogram abnormal   . Mechanical dysphagia 04/07/2014  . Radiation    54 Gy to distal esophagus  . Stroke [facility])     History reviewed. No pertinent surgical history.  Allergies: Patient has no known allergies.  Medications: Prior to Admission medications   Medication Sig Start Date End Date Taking? Authorizing Provider  amLODipine (NORVASC) 10 MG tablet Take 0.5 tablets (5 mg total) by mouth daily. 12/15/14  Yes Volanda Napoleon, MD  atorvastatin (LIPITOR) 40 MG tablet Take 40 mg by mouth every morning.  03/16/14  Yes [provider]  FLUoxetine (PROZAC) 10 MG capsule Take 1 capsule (10 mg total) by mouth daily. 08/13/16  Yes Ennever, Rudell Cobb, MD  folic acid (FOLVITE) 1 MG tablet TAKE ONE TABLET BY MOUTH ONCE DAILY. (ROUND YELLOW TAB WITH V 31/62) 12/11/16  Yes Ennever, Rudell Cobb, MD  levETIRAcetam (KEPPRA) 500 MG tablet Take 500 mg by mouth. 11/22/15 02/18/17 Yes [provider]  losartan (COZAAR) 50 MG tablet Take 50 mg by mouth daily.   Yes [provider]    magnesium gluconate (MAGONATE) 500 MG tablet Take 1 tablet (500 mg total) by mouth daily. 250 mg daily 11/09/15  Yes Ennever, Rudell Cobb, MD  pantoprazole (PROTONIX) 40 MG tablet Take 1 tablet (40 mg total) by mouth daily. 01/11/15  Yes Ennever, Rudell Cobb, MD  potassium chloride SA (K-DUR,KLOR-CON) 20 MEQ tablet Take 2 tablets (40 mEq total) by mouth daily. 03/12/16  Yes Volanda Napoleon, MD  clopidogrel (PLAVIX) 75 MG tablet Take 1 tablet (75 mg total) by mouth daily. 11/09/15   Volanda Napoleon, MD  fexofenadine (ALLEGRA) 180 MG tablet Take 180 mg by mouth as needed.  04/27/14 04/27/15  [provider]     History reviewed. No pertinent family history.  Social History   Socioeconomic History  . Marital status: Single    Spouse name: None  . Number of children: None  . Years of education: None  . Highest education level: None  Social Needs  . Financial resource strain: None  . Food insecurity - worry: None  . Food insecurity - inability: None  . Transportation needs - medical: None  . Transportation needs - non-medical: None  Occupational History  . None  Tobacco Use  . Smoking status: Current Every Day Smoker    Packs/day: 0.25    Years: 45.00    Pack years: 11.25    Types: Cigarettes    Start date: 06/05/1968  . Smokeless tobacco: Never Used  . Tobacco comment: 06/18/16 still  smoking  Substance and Sexual Activity  . Alcohol use: No    Alcohol/week: 0.0 oz  . Drug use: None  . Sexual activity: Not Currently  Other Topics Concern  . None  Social History Narrative  . None     Review of Systems: A 12 point ROS discussed and pertinent positives are indicated in the HPI above.  All other systems are negative.  Review of Systems  Vital Signs: BP (!) 169/70 (BP Location: Right Arm)   Pulse 65   Temp (!) 97.5 F (36.4 C) (Oral)   Resp 16   SpO2 99%   Physical Exam  Constitutional: Sheila Young is oriented to person, place, and time. Sheila Young appears well-developed. No distress.   HENT:  Head: Normocephalic.  Mouth/Throat: Oropharynx is clear and moist.  Neck: Normal range of motion. No JVD present. No tracheal deviation present.  Cardiovascular: Normal rate, regular rhythm and normal heart sounds.  Pulmonary/Chest: Effort normal and breath sounds normal. No respiratory distress.  Abdominal: Soft.  Neurological: Sheila Young is alert and oriented to person, place, and time.  Psychiatric: Sheila Young has a normal mood and affect.    Imaging: No results found.  Labs:  CBC: Recent Labs    06/18/16 1118 02/04/17 1358  WBC 9.2 9.5  HGB 12.2 10.8*  HCT 35.8 33.4*  PLT 200 358    COAGS: No results for input(s): INR, APTT in the last 8760 hours.  BMP: Recent Labs    06/18/16 1118 02/04/17 1358  NA 143 142  K 2.9* 3.9  CL  --  103  CO2 29 27  GLUCOSE 89 85  BUN 23.4 22  CALCIUM 9.7 10.4*  CREATININE 1.1 1.19*  GFRNONAA  --  48*  GFRAA  --  55*    LIVER FUNCTION TESTS: Recent Labs    06/18/16 1118 02/04/17 1358  BILITOT 0.44 0.2  AST 17 23  ALT 14 26  ALKPHOS 115 166*  PROT 7.6 8.0  ALBUMIN 3.7 3.9    TUMOR MARKERS: No results for input(s): AFPTM, CEA, CA199, CHROMGRNA in the last 8760 hours.  Assessment and Plan: Hx of esophageal cancer For port removal. Labs ok Risks and benefits discussed with the patient including, but not limited to bleeding, infection. All of the patient's questions were answered, patient is agreeable to proceed. Consent signed and in chart.    Thank you for this interesting consult.  I greatly enjoyed meeting Anavi Branscum and look forward to participating in their care.  A copy of this report was sent to the requesting provider on this date.  Electronically Signed: Ascencion Dike, PA-C 02/18/2017, 10:14 AM   I spent a total of 20 minutes in face to face in clinical consultation, greater than 50% of which was counseling/coordinating care for port removal.

## 2017-02-18 NOTE — Procedures (Signed)
Pre Procedural Dx: Poor venous access Post Procedural Dx: Same  Successful removal of anterior chest wall port-a-cath.  EBL: Minimal  No immediate post procedural complications.   Jay Eleana Tocco, MD Pager #: [phone]   

## 2017-02-18 NOTE — Discharge Instructions (Signed)
Implanted Port Removal, Care After °Refer to this sheet in the next few weeks. These instructions provide you with information about caring for yourself after your procedure. Your health care provider may also give you more specific instructions. Your treatment has been planned according to current medical practices, but problems sometimes occur. Call your health care provider if you have any problems or questions after your procedure. °What can I expect after the procedure? °After the procedure, it is common to have: °· Soreness or pain near your incision. °· Some swelling or bruising near your incision. ° °Follow these instructions at home: °Medicines °· Take over-the-counter and prescription medicines only as told by your health care provider. °· If you were prescribed an antibiotic medicine, take it as told by your health care provider. Do not stop taking the antibiotic even if you start to feel better. °Bathing °· Do not take baths, swim, or use a hot tub until your health care provider approves. Ask your health care provider if you can take showers. You may only be allowed to take sponge baths for bathing. °Incision care °· Follow instructions from your health care provider about how to take care of your incision. Make sure you: °? Wash your hands with soap and water before you change your bandage (dressing). If soap and water are not available, use hand sanitizer. °? Change your dressing as told by your health care provider. °? Keep your dressing dry. °? Leave stitches (sutures), skin glue, or adhesive strips in place. These skin closures may need to stay in place for 2 weeks or longer. If adhesive strip edges start to loosen and curl up, you may trim the loose edges. Do not remove adhesive strips completely unless your health care provider tells you to do that. °· Check your incision area every day for signs of infection. Check for: °? More redness, swelling, or pain. °? More fluid or  blood. °? Warmth. °? Pus or a bad smell. °Driving °· If you received a sedative, do not drive for 24 hours after the procedure. °· If you did not receive a sedative, ask your health care provider when it is safe to drive. °Activity °· Return to your normal activities as told by your health care provider. Ask your health care provider what activities are safe for you. °· Until your health care provider says it is safe: °? Do not lift anything that is heavier than 10 lb (4.5 kg). °? Do not do activities that involve lifting your arms over your head. °General instructions °· Do not use any tobacco products, such as cigarettes, chewing tobacco, and e-cigarettes. Tobacco can delay healing. If you need help quitting, ask your health care provider. °· Keep all follow-up visits as told by your health care provider. This is important. °Contact a health care provider if: °· You have more redness, swelling, or pain around your incision. °· You have more fluid or blood coming from your incision. °· Your incision feels warm to the touch. °· You have pus or a bad smell coming from your incision. °· You have a fever. °· You have pain that is not relieved by your pain medicine. °Get help right away if: °· You have chest pain. °· You have difficulty breathing. °This information is not intended to replace advice given to you by your health care provider. Make sure you discuss any questions you have with your health care provider. °Document Released: 03/06/2015 Document Revised: 08/31/2015 Document Reviewed: 12/28/2014 °Elsevier Interactive Patient   Education © 2018 [employer] °Moderate Conscious Sedation, Adult, Care After °These instructions provide you with information about caring for yourself after your procedure. Your health care provider may also give you more specific instructions. Your treatment has been planned according to current medical practices, but problems sometimes occur. Call your health care provider if you have  any problems or questions after your procedure. °What can I expect after the procedure? °After your procedure, it is common: °· To feel sleepy for several hours. °· To feel clumsy and have poor balance for several hours. °· To have poor judgment for several hours. °· To vomit if you eat too soon. ° °Follow these instructions at home: °For at least 24 hours after the procedure: ° °· Do not: °? Participate in activities where you could fall or become injured. °? Drive. °? Use heavy machinery. °? Drink alcohol. °? Take sleeping pills or medicines that cause drowsiness. °? Make important decisions or sign legal documents. °? Take care of children on your own. °· Rest. °Eating and drinking °· Follow the diet recommended by your health care provider. °· If you vomit: °? Drink water, juice, or soup when you can drink without vomiting. °? Make sure you have little or no nausea before eating solid foods. °General instructions °· Have a responsible adult stay with you until you are awake and alert. °· Take over-the-counter and prescription medicines only as told by your health care provider. °· If you smoke, do not smoke without supervision. °· Keep all follow-up visits as told by your health care provider. This is important. °Contact a health care provider if: °· You keep feeling nauseous or you keep vomiting. °· You feel light-headed. °· You develop a rash. °· You have a fever. °Get help right away if: °· You have trouble breathing. °This information is not intended to replace advice given to you by your health care provider. Make sure you discuss any questions you have with your health care provider. °Document Released: 01/13/2013 Document Revised: 08/28/2015 Document Reviewed: 07/15/2015 °Elsevier Interactive Patient Education © 2018 [employer] ° °

## 2017-06-09 ENCOUNTER — Emergency Department (HOSPITAL_BASED_OUTPATIENT_CLINIC_OR_DEPARTMENT_OTHER)
Admission: EM | Admit: 2017-06-09 | Discharge: 2017-06-09 | Disposition: A | Discharge status: Home / Self Care | Payer: Medicare Other | Attending: Emergency Medicine | Admitting: Emergency Medicine

## 2017-06-09 ENCOUNTER — Other Ambulatory Visit: Payer: Self-pay

## 2017-06-09 ENCOUNTER — Emergency Department (HOSPITAL_BASED_OUTPATIENT_CLINIC_OR_DEPARTMENT_OTHER): Payer: Medicare Other

## 2017-06-09 ENCOUNTER — Encounter (HOSPITAL_BASED_OUTPATIENT_CLINIC_OR_DEPARTMENT_OTHER): Payer: Self-pay | Admitting: Emergency Medicine

## 2017-06-09 DIAGNOSIS — Z7902 Long term (current) use of antithrombotics/antiplatelets: Secondary | ICD-10-CM | POA: Diagnosis not present

## 2017-06-09 DIAGNOSIS — R69 Illness, unspecified: Secondary | ICD-10-CM

## 2017-06-09 DIAGNOSIS — E876 Hypokalemia: Secondary | ICD-10-CM

## 2017-06-09 DIAGNOSIS — R112 Nausea with vomiting, unspecified: Secondary | ICD-10-CM | POA: Diagnosis not present

## 2017-06-09 DIAGNOSIS — R1111 Vomiting without nausea: Secondary | ICD-10-CM

## 2017-06-09 DIAGNOSIS — Z79899 Other long term (current) drug therapy: Secondary | ICD-10-CM | POA: Diagnosis not present

## 2017-06-09 DIAGNOSIS — I10 Essential (primary) hypertension: Secondary | ICD-10-CM | POA: Diagnosis not present

## 2017-06-09 DIAGNOSIS — F17210 Nicotine dependence, cigarettes, uncomplicated: Secondary | ICD-10-CM | POA: Diagnosis not present

## 2017-06-09 DIAGNOSIS — Z8501 Personal history of malignant neoplasm of esophagus: Secondary | ICD-10-CM | POA: Insufficient documentation

## 2017-06-09 LAB — COMPREHENSIVE METABOLIC PANEL
ALBUMIN: 3.9 g/dL (ref 3.5–5.0)
ALK PHOS: 89 U/L (ref 38–126)
ALT: 16 U/L (ref 14–54)
AST: 18 U/L (ref 15–41)
Anion gap: 10 (ref 5–15)
BUN: 28 mg/dL — ABNORMAL HIGH (ref 6–20)
CALCIUM: 9.5 mg/dL (ref 8.9–10.3)
CO2: 29 mmol/L (ref 22–32)
CREATININE: 1.32 mg/dL — AB (ref 0.44–1.00)
Chloride: 100 mmol/L — ABNORMAL LOW (ref 101–111)
GFR calc Af Amer: 48 mL/min — ABNORMAL LOW (ref >60)
GFR calc non Af Amer: 41 mL/min — ABNORMAL LOW (ref >60)
GLUCOSE: 110 mg/dL — AB (ref 65–99)
Potassium: 2.6 mmol/L — CL (ref 3.5–5.1)
SODIUM: 139 mmol/L (ref 135–145)
Total Bilirubin: 0.6 mg/dL (ref 0.3–1.2)
Total Protein: 7.9 g/dL (ref 6.5–8.1)

## 2017-06-09 LAB — CBC
HCT: 38.2 % (ref 36.0–46.0)
Hemoglobin: 12.6 g/dL (ref 12.0–15.0)
MCH: 29.1 pg (ref 26.0–34.0)
MCHC: 33 g/dL (ref 30.0–36.0)
MCV: 88.2 fL (ref 78.0–100.0)
PLATELETS: 285 10*3/uL (ref 150–400)
RBC: 4.33 MIL/uL (ref 3.87–5.11)
RDW: 15.1 % (ref 11.5–15.5)
WBC: 11.8 10*3/uL — ABNORMAL HIGH (ref 4.0–10.5)

## 2017-06-09 LAB — URINALYSIS, ROUTINE W REFLEX MICROSCOPIC
BILIRUBIN URINE: NEGATIVE
Glucose, UA: NEGATIVE mg/dL
HGB URINE DIPSTICK: NEGATIVE
Ketones, ur: NEGATIVE mg/dL
Leukocytes, UA: NEGATIVE
Nitrite: NEGATIVE
PROTEIN: NEGATIVE mg/dL
Specific Gravity, Urine: 1.015 (ref 1.005–1.030)
pH: 6 (ref 5.0–8.0)

## 2017-06-09 LAB — LIPASE, BLOOD: Lipase: 22 U/L (ref 11–51)

## 2017-06-09 MED ORDER — POTASSIUM CHLORIDE 20 MEQ/15ML (10%) PO SOLN
ORAL | Status: AC
  Filled 2017-06-09: qty 15

## 2017-06-09 MED ORDER — POTASSIUM CHLORIDE 20 MEQ/15ML (10%) PO SOLN
40.0000 meq | Freq: Once | ORAL | Status: AC
  Administered 2017-06-09: 40 meq via ORAL

## 2017-06-09 MED ORDER — POTASSIUM CHLORIDE 20 MEQ PO PACK: 40.0000 meq | PACK | Freq: Two times a day (BID) | ORAL | 0 refills | Status: DC

## 2017-06-09 MED ORDER — METOCLOPRAMIDE HCL 10 MG PO TABS: 10.0000 mg | ORAL_TABLET | Freq: Three times a day (TID) | ORAL | 0 refills | Status: DC

## 2017-06-09 MED ORDER — FAMOTIDINE 20 MG PO TABS: 20.0000 mg | ORAL_TABLET | Freq: Two times a day (BID) | ORAL | 0 refills | Status: DC

## 2017-06-09 MED ORDER — POTASSIUM CHLORIDE 20 MEQ PO PACK
40.0000 meq | PACK | Freq: Once | ORAL | Status: DC
  Filled 2017-06-09: qty 2

## 2017-06-09 MED FILL — METOCLOPRAMIDE 10 MG TABLET: 10 | 20 days supply | Qty: 60 | Fill #0

## 2017-06-09 MED FILL — FAMOTIDINE 20 MG TABLET: 20 | 15 days supply | Qty: 30 | Fill #0

## 2017-06-09 NOTE — ED Triage Notes (Signed)
Patient c/o lack of appetite, vomiting x 2 days.  Denies abdominal pain.

## 2017-06-09 NOTE — Discharge Instructions (Signed)
1.  Call your family doctor tomorrow morning to schedule a recheck this week.  You will need your potassium level rechecked within the next 2-3 days.  As discussed in the emergency department, high potassium can be a dangerous condition and because you also have kidney problems, you may be more likely to suddenly get high potassium. This must be closely monitored. 2.  Because of your prior history of esophageal cancer, you may need additional testing for your symptoms.  Though your symptoms may simply be a stomach virus, early recurrences of esophageal cancer can be difficult to detect.  Call your doctor and discuss follow-up swallowing studies or other imaging tests if needed. 3.  Return to the emergency department if you are developing worsening symptoms or other concerns.

## 2017-06-09 NOTE — ED Provider Notes (Signed)
[facility] Provider Note   CSN: [id number] Arrival date & time: 06/09/17  1225     History   Chief Complaint Chief Complaint  Patient presents with  . Emesis    HPI Sheila Young is a 67 y.o. female.  HPI Patient has had vomiting intermittently for a couple of days.  Patient and her daughter seem to disagree on her history.  Reports that the symptoms have been minimal.  She reports sometimes she has thrown up after eating but is only been for a couple of days.  Denies she has any associated pain.  Patient's daughter is concerned because patient has history of esophageal cancer.  She reports her sister continues to smoke and drinks too much Pepsi.  Patient and sister agree that if she is drinking Pepsi that that does not cause her to vomit and she tolerates that.  Sister was concerned today because, the patient goes to the adult day center and they called her this afternoon to say that she vomited after lunch.  Denies that she has any ongoing diarrhea.  She reports he has occasional loose stool.  She reports she has had an "cold" for a while.  She however qualifies that she always has a little bit of a cold symptoms.  The patient reports he feels fine and she is only here because her sister is making her come.  Patient is seen by oncology, Dr. Marin Olp as well as nephrology. Past Medical History:  Diagnosis Date  . Anger   . Esophagus cancer ([place]) 04/07/2014  . 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 History of noncompliance with medical treatment   . Hypercholesteremia   . Hypertension   . Hypokalemia   . Mammogram abnormal   . Mechanical dysphagia 04/07/2014  . Radiation    54 Gy to distal esophagus  . Stroke [facility])     Patient Active Problem List   Diagnosis Date Noted  . Hypokalemia 05/20/2014  . Dysphagia   . Protein-calorie malnutrition, severe ([place]) 04/09/2014  . Esophagus cancer ([place]) 04/07/2014  . Mechanical dysphagia 04/07/2014  . Esophageal  cancer ([place]) 04/07/2014    Past Surgical History:  Procedure Laterality Date  . IR REMOVAL TUN ACCESS W/ PORT W/O FL MOD SED  02/18/2017    OB History    No data available       Home Medications    Prior to Admission medications   Medication Sig Start Date End Date Taking? Authorizing Provider  amLODipine (NORVASC) 10 MG tablet Take 0.5 tablets (5 mg total) by mouth daily. 12/15/14   Volanda Napoleon, MD  atorvastatin (LIPITOR) 40 MG tablet Take 40 mg by mouth every morning.  03/16/14   [provider]  clopidogrel (PLAVIX) 75 MG tablet Take 1 tablet (75 mg total) by mouth daily. 11/09/15   Volanda Napoleon, MD  famotidine (PEPCID) 20 MG tablet Take 1 tablet (20 mg total) by mouth 2 (two) times daily. 06/09/17   Charlesetta Shanks, MD  fexofenadine (ALLEGRA) 180 MG tablet Take 180 mg by mouth as needed.  04/27/14 04/27/15  [provider]  FLUoxetine (PROZAC) 10 MG capsule Take 1 capsule (10 mg total) by mouth daily. 08/13/16   Volanda Napoleon, MD  folic acid (FOLVITE) 1 MG tablet TAKE ONE TABLET BY MOUTH ONCE DAILY. (ROUND YELLOW TAB WITH V 31/62) 12/11/16   Volanda Napoleon, MD  levETIRAcetam (KEPPRA) 500 MG tablet Take 500 mg by mouth. 11/22/15 02/18/17  [provider]  losartan (COZAAR) 50 MG tablet Take 50 mg by mouth daily.    [provider]  magnesium gluconate (MAGONATE) 500 MG tablet Take 1 tablet (500 mg total) by mouth daily. 250 mg daily 11/09/15   Volanda Napoleon, MD  metoCLOPramide (REGLAN) 10 MG tablet Take 1 tablet (10 mg total) by mouth 3 (three) times daily before meals. 06/09/17   Charlesetta Shanks, MD  pantoprazole (PROTONIX) 40 MG tablet Take 1 tablet (40 mg total) by mouth daily. 01/11/15   Volanda Napoleon, MD  potassium chloride (KLOR-CON) 20 MEQ packet Take 40 mEq by mouth 2 (two) times daily. Take 2 packets by mouth 2 times daily for the next 3 days.  Then go back to your regular dose of 2 packets once daily. 06/09/17   Charlesetta Shanks, MD    potassium chloride SA (K-DUR,KLOR-CON) 20 MEQ tablet Take 2 tablets (40 mEq total) by mouth daily. 03/12/16   Volanda Napoleon, MD    Family History History reviewed. No pertinent family history.  Social History Social History   Tobacco Use  . Smoking status: Current Every Day Smoker    Packs/day: 0.25    Years: 45.00    Pack years: 11.25    Types: Cigarettes    Start date: 06/05/1968  . Smokeless tobacco: Never Used  . Tobacco comment: 06/18/16 still smoking  Substance Use Topics  . Alcohol use: No    Alcohol/week: 0.0 oz  . Drug use: No     Allergies   Patient has no known allergies.   Review of Systems Review of Systems 10 Systems reviewed and are negative for acute change except as noted in the HPI.   Physical Exam Updated Vital Signs BP 140/75 (BP Location: Right Arm)   Pulse (!) 57   Temp 97.8 F (36.6 C) (Oral)   Resp 18   Ht 5\' 6"  (1.676 m)   Wt 72.6 kg (160 lb)   SpO2 98%   BMI 25.82 kg/m   Physical Exam  Constitutional: She is oriented to person, place, and time. She appears well-developed and well-nourished. No distress.  HENT:  Head: Normocephalic and atraumatic.  Nose: Nose normal.  Oropharynx clear and moist.  Oropharynx widely patent.  Poor dentition.  Eyes: Conjunctivae and EOM are normal.  Neck: Neck supple.  Cardiovascular: Normal rate, regular rhythm, normal heart sounds and intact distal pulses.  No murmur heard. Pulmonary/Chest: Effort normal and breath sounds normal. No respiratory distress.  Abdominal: Soft. Bowel sounds are normal. She exhibits no distension. There is no tenderness. There is no guarding.  Musculoskeletal: Normal range of motion. She exhibits no edema or tenderness.  Neurological: She is alert and oriented to person, place, and time. No cranial nerve deficit. She exhibits normal muscle tone. Coordination normal.  Skin: Skin is warm and dry.  Psychiatric: She has a normal mood and affect.  Nursing note and vitals  reviewed.    ED Treatments / Results  Labs (all labs ordered are listed, but only abnormal results are displayed) Labs Reviewed  COMPREHENSIVE METABOLIC PANEL - Abnormal; Notable for the following components:      Result Value   Potassium 2.6 (*)    Chloride 100 (*)    Glucose, Bld 110 (*)    BUN 28 (*)    Creatinine, Ser 1.32 (*)    GFR calc non Af Amer 41 (*)    GFR calc Af Amer 48 (*)    All other components within normal  limits  CBC - Abnormal; Notable for the following components:   WBC 11.8 (*)    All other components within normal limits  LIPASE, BLOOD  URINALYSIS, ROUTINE W REFLEX MICROSCOPIC    EKG  EKG Interpretation None       Radiology Dg Abd Acute W/chest  Result Date: 06/09/2017 CLINICAL DATA:  Vomiting diarrhea EXAM: DG ABDOMEN ACUTE W/ 1V CHEST COMPARISON:  Chest 12/20/2016 FINDINGS: Mild cardiac enlargement. Negative for heart failure. Small right pleural effusion improved from the prior study. Improved aeration in the bases since prior study. Port-A-Cath has been removed since the prior study. Normal bowel gas pattern. No bowel obstruction or free air. No urinary tract calculi. IMPRESSION: Cardiac enlargement without heart failure. Small right effusion improved from the prior study Normal bowel gas pattern. Electronically Signed   By: Franchot Gallo M.D.   On: 06/09/2017 15:29    Procedures Procedures (including critical care time)  Medications Ordered in ED Medications  potassium chloride 20 MEQ/15ML (10%) solution 40 mEq (40 mEq Oral Given 06/09/17 1550)     Initial Impression / Assessment and Plan / ED Course  I have reviewed the triage vital signs and the nursing notes.  Pertinent labs & imaging results that were available during my care of the patient were reviewed by me and considered in my medical decision making (see chart for details).     Final Clinical Impressions(s) / ED Diagnoses   Final diagnoses:  Non-intractable vomiting without  nausea, unspecified vomiting type  Severe comorbid illness  Hypokalemia   Patient is clinically well in appearance.  Patient's perspective is symptoms are minimal.  She however has significant comorbid illness of prior esophageal cancer.  At this time, there are no signs of dehydration or obstruction.  Patient has normal orthostatic vital signs, stable renal insufficiency and chronic, recurrent hypokalemia.  She did not take her potassium yesterday or today per her report.  So, with reported vomiting, albeit described as minimal amounts if she may have had decreased potassium absorption as well.  Acute abdominal series does not show any evidence of obstruction or other concerning, new findings in the chest.  Lung exam are normal.  Abdominal examination is nontender.  Patient and her sister however are counseled on necessity for follow-up this week for several reasons.  One, we reviewed hypo-and hyperkalemia and the seriousness of hyperkalemia with possibility of developing this in the face of pre-existing renal insufficiency.  They are aware that this needs to be monitored this week by recheck potassium levels.  Also, although patient's vomiting symptoms seem quite mild, with known history of esophageal cancer she may need a swallowing study or possibly other imaging studies.  They are aware of the insidious nature of recurrence of cancer and the importance of close follow-up.  Return precautions reviewed. ED Discharge Orders        Ordered    metoCLOPramide (REGLAN) 10 MG tablet  3 times daily before meals     06/09/17 1559    famotidine (PEPCID) 20 MG tablet  2 times daily     06/09/17 1559    potassium chloride (KLOR-CON) 20 MEQ packet  2 times daily     06/09/17 1559       Charlesetta Shanks, MD 06/09/17 1606

## 2017-06-10 ENCOUNTER — Other Ambulatory Visit: Payer: Self-pay | Admitting: Family

## 2017-06-10 DIAGNOSIS — C154 Malignant neoplasm of middle third of esophagus: Secondary | ICD-10-CM

## 2017-06-11 ENCOUNTER — Inpatient Hospital Stay: Payer: Medicare Other

## 2017-06-11 ENCOUNTER — Inpatient Hospital Stay: Payer: Medicare Other | Attending: Family | Admitting: Family

## 2017-06-11 VITALS — BP 153/63 | HR 58 | Temp 98.0°F | Resp 17 | Wt 162.5 lb

## 2017-06-11 DIAGNOSIS — R1111 Vomiting without nausea: Secondary | ICD-10-CM

## 2017-06-11 DIAGNOSIS — Z9221 Personal history of antineoplastic chemotherapy: Secondary | ICD-10-CM | POA: Insufficient documentation

## 2017-06-11 DIAGNOSIS — C154 Malignant neoplasm of middle third of esophagus: Secondary | ICD-10-CM

## 2017-06-11 DIAGNOSIS — Z923 Personal history of irradiation: Secondary | ICD-10-CM | POA: Diagnosis not present

## 2017-06-11 LAB — CBC WITH DIFFERENTIAL (CANCER CENTER ONLY)
Basophils Absolute: 0 10*3/uL (ref 0.0–0.1)
Basophils Relative: 0 %
EOS ABS: 0.3 10*3/uL (ref 0.0–0.5)
EOS PCT: 2 %
HCT: 39.7 % (ref 34.8–46.6)
Hemoglobin: 12.9 g/dL (ref 11.6–15.9)
LYMPHS ABS: 3.1 10*3/uL (ref 0.9–3.3)
Lymphocytes Relative: 24 %
MCH: 29.1 pg (ref 26.0–34.0)
MCHC: 32.5 g/dL (ref 32.0–36.0)
MCV: 89.6 fL (ref 81.0–101.0)
MONO ABS: 1.2 10*3/uL — AB (ref 0.1–0.9)
MONOS PCT: 9 %
Neutro Abs: 8.3 10*3/uL — ABNORMAL HIGH (ref 1.5–6.5)
Neutrophils Relative %: 65 %
PLATELETS: 296 10*3/uL (ref 145–400)
RBC: 4.43 MIL/uL (ref 3.70–5.32)
RDW: 15.2 % (ref 11.1–15.7)
WBC Count: 12.8 10*3/uL — ABNORMAL HIGH (ref 3.9–10.0)

## 2017-06-11 LAB — CMP (CANCER CENTER ONLY)
ALT: 16 U/L (ref 10–47)
AST: 19 U/L (ref 11–38)
Albumin: 3.8 g/dL (ref 3.5–5.0)
Alkaline Phosphatase: 98 U/L — ABNORMAL HIGH (ref 26–84)
Anion gap: 14 (ref 5–15)
BUN: 22 mg/dL (ref 7–22)
CHLORIDE: 104 mmol/L (ref 98–108)
CO2: 32 mmol/L (ref 18–33)
Calcium: 10.6 mg/dL — ABNORMAL HIGH (ref 8.0–10.3)
Creatinine: 1.6 mg/dL — ABNORMAL HIGH (ref 0.60–1.20)
GLUCOSE: 120 mg/dL — AB (ref 73–118)
POTASSIUM: 4 mmol/L (ref 3.3–4.7)
SODIUM: 150 mmol/L — AB (ref 128–145)
Total Bilirubin: 0.7 mg/dL (ref 0.2–1.6)
Total Protein: 8 g/dL (ref 6.4–8.1)

## 2017-06-11 NOTE — Progress Notes (Signed)
Hematology and Oncology Follow Up Visit  Sheila Young [id number] 04/18/1950 67 y.o. 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 She was in the ED Monday with c/o vomiting when eating. She states that she is not really eating at this time but has been drinking Ensure. Her weight is stable.  She denies choking or having problems swallowing.  She is still smoking.  No fever, chills, cough, rash, dizziness, SOB, chest pain, palpitations, abdominal pain or changes in bowel or bladder habits.  No swelling, tenderness, numbness or tingling in her extremities. No c/o pain.  No bleeding or bruising. No lymphadenopathy found on exam.   ECOG Performance Status: 1 - Symptomatic but completely ambulatory  Medications:  Allergies as of 06/11/2017   No Known Allergies     Medication List        Accurate as of 06/11/17  3:21 PM. Always use your most recent med list.          amLODipine 10 MG tablet Commonly known as:  NORVASC Take 0.5 tablets (5 mg total) by mouth daily.   atorvastatin 40 MG tablet Commonly known as:  LIPITOR Take 40 mg by mouth every morning.   clopidogrel 75 MG tablet Commonly known as:  PLAVIX Take 1 tablet (75 mg total) by mouth daily.   famotidine 20 MG tablet Commonly known as:  PEPCID Take 1 tablet (20 mg total) by mouth 2 (two) times daily.   fexofenadine 180 MG tablet Commonly known as:  ALLEGRA Take 180 mg by mouth as needed.   FLUoxetine 10 MG capsule Commonly known as:  PROZAC Take 1 capsule (10 mg total) by mouth daily.   folic acid 1 MG tablet Commonly known as:  FOLVITE TAKE ONE TABLET BY MOUTH ONCE DAILY. (ROUND YELLOW TAB WITH V 31/62)   levETIRAcetam 500 MG tablet Commonly known as:  KEPPRA Take 500 mg by  mouth.   losartan 50 MG tablet Commonly known as:  COZAAR Take 50 mg by mouth daily.   magnesium gluconate 500 MG tablet Commonly known as:  MAGONATE Take 1 tablet (500 mg total) by mouth daily. 250 mg daily   metoCLOPramide 10 MG tablet Commonly known as:  REGLAN Take 1 tablet (10 mg total) by mouth 3 (three) times daily before meals.   pantoprazole 40 MG tablet Commonly known as:  PROTONIX Take 1 tablet (40 mg total) by mouth daily.   potassium chloride 20 MEQ packet Commonly known as:  KLOR-CON Take 40 mEq by mouth 2 (two) times daily. Take 2 packets by mouth 2 times daily for the next 3 days.  Then go back to your regular dose of 2 packets once daily.   potassium chloride SA 20 MEQ tablet Commonly known as:  K-DUR,KLOR-CON Take 2 tablets (40 mEq total) by mouth daily.       Allergies: No Known Allergies  Past Medical History, Surgical history, Social history, and Family History were reviewed and updated.  Review of Systems: All other 10 point review of systems is negative.   Physical Exam:  weight is 162 lb 8 oz (73.7 kg). Her oral temperature is 98 F (36.7 C). Her blood pressure is 153/63 (abnormal) and her pulse is 58 (abnormal). Her respiration is 17 and oxygen saturation is 100%.   Wt  Readings from Last 3 Encounters:  06/11/17 162 lb 8 oz (73.7 kg)  06/09/17 160 lb (72.6 kg)  02/04/17 156 lb (70.8 kg)    Ocular: Sclerae unicteric, pupils equal, round and reactive to light Ear-nose-throat: Oropharynx clear, dentition fair Lymphatic: No cervical, supraclavicular or axillary adenopathy Lungs no rales or rhonchi, good excursion bilaterally Heart regular rate and rhythm, no murmur appreciated Abd soft, nontender, positive bowel sounds, no liver or spleen tip palpated on exam, no fluid wave  MSK no focal spinal tenderness, no joint edema Neuro: non-focal, well-oriented, appropriate affect Breasts: Deferred   Lab Results  Component Value Date   WBC 12.8 (H)  06/11/2017   HGB 12.6 06/09/2017   HCT 39.7 06/11/2017   MCV 89.6 06/11/2017   PLT 296 06/11/2017   Lab Results  Component Value Date   FERRITIN 417 (H) 02/04/2017   IRON 61 02/04/2017   TIBC 278 02/04/2017   UIBC 217 02/04/2017   IRONPCTSAT 22 02/04/2017   Lab Results  Component Value Date   RETICCTPCT 1.1 04/07/2014   RBC 4.43 06/11/2017   RETICCTABS 43.3 04/07/2014   No results found for: KPAFRELGTCHN, LAMBDASER, KAPLAMBRATIO No results found for: IGGSERUM, IGA, IGMSERUM No results found for: Odetta Pink, SPEI   Chemistry      Component Value Date/Time   NA 139 06/09/2017 1445   NA 142 02/04/2017 1358   NA 143 06/18/2016 1118   K 2.6 (LL) 06/09/2017 1445   K 3.9 02/04/2017 1358   K 2.9 (LL) 06/18/2016 1118   CL 100 (L) 06/09/2017 1445   CL 103 02/04/2017 1358   CL 99 07/24/2015 0933   CO2 29 06/09/2017 1445   CO2 27 02/04/2017 1358   CO2 29 06/18/2016 1118   BUN 28 (H) 06/09/2017 1445   BUN 22 02/04/2017 1358   BUN 23.4 06/18/2016 1118   CREATININE 1.32 (H) 06/09/2017 1445   CREATININE 1.19 (H) 02/04/2017 1358   CREATININE 1.1 06/18/2016 1118      Component Value Date/Time   CALCIUM 9.5 06/09/2017 1445   CALCIUM 10.4 (H) 02/04/2017 1358   CALCIUM 9.7 06/18/2016 1118   ALKPHOS 89 06/09/2017 1445   ALKPHOS 166 (H) 02/04/2017 1358   ALKPHOS 115 06/18/2016 1118   AST 18 06/09/2017 1445   AST 23 02/04/2017 1358   AST 17 06/18/2016 1118   ALT 16 06/09/2017 1445   ALT 26 02/04/2017 1358   ALT 14 06/18/2016 1118   BILITOT 0.6 06/09/2017 1445   BILITOT 0.2 02/04/2017 1358   BILITOT 0.44 06/18/2016 1118      Impression and Plan: Sheila Young is a very pleasant 67 yo African American female with history of locally advanced squamous cell carcinoma of the esophagus. She completed treatment with radiation and concurrent chemo in February 2016. She was in the ED earlier this week for vomiting when trying to eat.   Xray of abdomen and 1 view chest in ED was negative.  We will get a barium swallow study on her and then refer her back to Dr. Alonza Young with GI if needed.  We will contact her for follow-up once we have the results.  Both she and her sister know to contact our office with any questions or concerns and go to the ED in the event of an emergency.   Laverna Peace, NP 3/6/20193:21 PM

## 2017-06-12 LAB — LACTATE DEHYDROGENASE: LDH: 202 U/L (ref 125–245)

## 2017-06-25 ENCOUNTER — Telehealth: Payer: Self-pay | Admitting: Family

## 2017-06-25 ENCOUNTER — Ambulatory Visit (HOSPITAL_COMMUNITY)
Admission: RE | Admit: 2017-06-25 | Discharge: 2017-06-25 | Disposition: A | Discharge status: Home / Self Care | Payer: Medicare Other | Source: Ambulatory Visit | Attending: Family | Admitting: Family

## 2017-06-25 DIAGNOSIS — K222 Esophageal obstruction: Secondary | ICD-10-CM | POA: Diagnosis not present

## 2017-06-25 DIAGNOSIS — C154 Malignant neoplasm of middle third of esophagus: Secondary | ICD-10-CM | POA: Diagnosis not present

## 2017-06-25 DIAGNOSIS — R1111 Vomiting without nausea: Secondary | ICD-10-CM

## 2017-06-25 DIAGNOSIS — Z9889 Other specified postprocedural states: Secondary | ICD-10-CM | POA: Insufficient documentation

## 2017-06-25 NOTE — Telephone Encounter (Signed)
I tried 2 separate numbers with no answer. I left message with call back number to discuss x-ray results. Will try again later if no call back.

## 2017-07-02 ENCOUNTER — Telehealth: Payer: Self-pay | Admitting: Family

## 2017-07-02 NOTE — Telephone Encounter (Signed)
Spoke with patient's sister and care giver Matthias Hughs and let her know patient needs to follow-up with Dr. Rolan Lipa with GI for esophogeal dilation. She states that she will call now. I have also forwarded her her scan to Dr. Alonza Bogus.

## 2017-08-05 ENCOUNTER — Inpatient Hospital Stay: Payer: Medicare Other | Attending: Family | Admitting: Family

## 2017-08-05 ENCOUNTER — Inpatient Hospital Stay: Payer: Medicare Other

## 2017-09-03 ENCOUNTER — Other Ambulatory Visit: Payer: Self-pay

## 2017-09-03 ENCOUNTER — Emergency Department (HOSPITAL_BASED_OUTPATIENT_CLINIC_OR_DEPARTMENT_OTHER): Payer: Medicare Other

## 2017-09-03 ENCOUNTER — Encounter (HOSPITAL_BASED_OUTPATIENT_CLINIC_OR_DEPARTMENT_OTHER): Payer: Self-pay

## 2017-09-03 ENCOUNTER — Observation Stay (HOSPITAL_BASED_OUTPATIENT_CLINIC_OR_DEPARTMENT_OTHER)
Admission: EM | Admit: 2017-09-03 | Discharge: 2017-09-07 | Disposition: A | Discharge status: Home / Self Care | Payer: Medicare Other | Attending: Internal Medicine | Admitting: Internal Medicine

## 2017-09-03 DIAGNOSIS — Z8673 Personal history of transient ischemic attack (TIA), and cerebral infarction without residual deficits: Secondary | ICD-10-CM | POA: Diagnosis not present

## 2017-09-03 DIAGNOSIS — R1319 Other dysphagia: Secondary | ICD-10-CM | POA: Diagnosis present

## 2017-09-03 DIAGNOSIS — I129 Hypertensive chronic kidney disease with stage 1 through stage 4 chronic kidney disease, or unspecified chronic kidney disease: Secondary | ICD-10-CM | POA: Diagnosis not present

## 2017-09-03 DIAGNOSIS — Z79899 Other long term (current) drug therapy: Secondary | ICD-10-CM | POA: Diagnosis not present

## 2017-09-03 DIAGNOSIS — I700 Atherosclerosis of aorta: Secondary | ICD-10-CM | POA: Insufficient documentation

## 2017-09-03 DIAGNOSIS — Z8501 Personal history of malignant neoplasm of esophagus: Secondary | ICD-10-CM | POA: Insufficient documentation

## 2017-09-03 DIAGNOSIS — R262 Difficulty in walking, not elsewhere classified: Secondary | ICD-10-CM | POA: Insufficient documentation

## 2017-09-03 DIAGNOSIS — N179 Acute kidney failure, unspecified: Secondary | ICD-10-CM | POA: Diagnosis not present

## 2017-09-03 DIAGNOSIS — G40909 Epilepsy, unspecified, not intractable, without status epilepticus: Secondary | ICD-10-CM | POA: Diagnosis not present

## 2017-09-03 DIAGNOSIS — K922 Gastrointestinal hemorrhage, unspecified: Principal | ICD-10-CM | POA: Diagnosis present

## 2017-09-03 DIAGNOSIS — K5731 Diverticulosis of large intestine without perforation or abscess with bleeding: Secondary | ICD-10-CM

## 2017-09-03 DIAGNOSIS — F329 Major depressive disorder, single episode, unspecified: Secondary | ICD-10-CM | POA: Insufficient documentation

## 2017-09-03 DIAGNOSIS — K625 Hemorrhage of anus and rectum: Secondary | ICD-10-CM | POA: Diagnosis present

## 2017-09-03 DIAGNOSIS — F17210 Nicotine dependence, cigarettes, uncomplicated: Secondary | ICD-10-CM | POA: Insufficient documentation

## 2017-09-03 DIAGNOSIS — E7800 Pure hypercholesterolemia, unspecified: Secondary | ICD-10-CM | POA: Insufficient documentation

## 2017-09-03 DIAGNOSIS — R4702 Dysphasia: Secondary | ICD-10-CM | POA: Diagnosis not present

## 2017-09-03 DIAGNOSIS — E876 Hypokalemia: Secondary | ICD-10-CM | POA: Diagnosis not present

## 2017-09-03 DIAGNOSIS — Z9221 Personal history of antineoplastic chemotherapy: Secondary | ICD-10-CM | POA: Diagnosis not present

## 2017-09-03 DIAGNOSIS — D500 Iron deficiency anemia secondary to blood loss (chronic): Secondary | ICD-10-CM | POA: Diagnosis not present

## 2017-09-03 DIAGNOSIS — Z7902 Long term (current) use of antithrombotics/antiplatelets: Secondary | ICD-10-CM | POA: Diagnosis not present

## 2017-09-03 DIAGNOSIS — N182 Chronic kidney disease, stage 2 (mild): Secondary | ICD-10-CM | POA: Diagnosis not present

## 2017-09-03 DIAGNOSIS — C159 Malignant neoplasm of esophagus, unspecified: Secondary | ICD-10-CM | POA: Diagnosis present

## 2017-09-03 DIAGNOSIS — Z923 Personal history of irradiation: Secondary | ICD-10-CM | POA: Insufficient documentation

## 2017-09-03 DIAGNOSIS — D62 Acute posthemorrhagic anemia: Secondary | ICD-10-CM

## 2017-09-03 LAB — COMPREHENSIVE METABOLIC PANEL
ALT: 20 U/L (ref 14–54)
AST: 26 U/L (ref 15–41)
Albumin: 3.7 g/dL (ref 3.5–5.0)
Alkaline Phosphatase: 73 U/L (ref 38–126)
Anion gap: 10 (ref 5–15)
BUN: 30 mg/dL — ABNORMAL HIGH (ref 6–20)
CHLORIDE: 106 mmol/L (ref 101–111)
CO2: 27 mmol/L (ref 22–32)
CREATININE: 1.21 mg/dL — AB (ref 0.44–1.00)
Calcium: 9.4 mg/dL (ref 8.9–10.3)
GFR calc non Af Amer: 46 mL/min — ABNORMAL LOW (ref >60)
GFR, EST AFRICAN AMERICAN: 53 mL/min — AB (ref >60)
Glucose, Bld: 119 mg/dL — ABNORMAL HIGH (ref 65–99)
Potassium: 3.1 mmol/L — ABNORMAL LOW (ref 3.5–5.1)
Sodium: 143 mmol/L (ref 135–145)
Total Bilirubin: 0.5 mg/dL (ref 0.3–1.2)
Total Protein: 7.3 g/dL (ref 6.5–8.1)

## 2017-09-03 LAB — CBC
HCT: 31.4 % — ABNORMAL LOW (ref 36.0–46.0)
HCT: 28.2 % — ABNORMAL LOW (ref 36.0–46.0)
Hemoglobin: 10.6 g/dL — ABNORMAL LOW (ref 12.0–15.0)
Hemoglobin: 9.5 g/dL — ABNORMAL LOW (ref 12.0–15.0)
MCH: 29.7 pg (ref 26.0–34.0)
MCH: 29.5 pg (ref 26.0–34.0)
MCHC: 33.8 g/dL (ref 30.0–36.0)
MCHC: 33.7 g/dL (ref 30.0–36.0)
MCV: 88 fL (ref 78.0–100.0)
MCV: 87.6 fL (ref 78.0–100.0)
PLATELETS: 229 10*3/uL (ref 150–400)
Platelets: 239 10*3/uL (ref 150–400)
RBC: 3.57 MIL/uL — AB (ref 3.87–5.11)
RBC: 3.22 MIL/uL — ABNORMAL LOW (ref 3.87–5.11)
RDW: 15.3 % (ref 11.5–15.5)
RDW: 15.4 % (ref 11.5–15.5)
WBC: 16.3 10*3/uL — AB (ref 4.0–10.5)
WBC: 13.2 10*3/uL — ABNORMAL HIGH (ref 4.0–10.5)

## 2017-09-03 LAB — DIFFERENTIAL
BASOS PCT: 0 %
Basophils Absolute: 0 10*3/uL (ref 0.0–0.1)
EOS ABS: 0.1 10*3/uL (ref 0.0–0.7)
Eosinophils Relative: 1 %
Lymphocytes Relative: 21 %
Lymphs Abs: 3.4 10*3/uL (ref 0.7–4.0)
MONO ABS: 1 10*3/uL (ref 0.1–1.0)
MONOS PCT: 6 %
Neutro Abs: 12 10*3/uL — ABNORMAL HIGH (ref 1.7–7.7)
Neutrophils Relative %: 72 %

## 2017-09-03 LAB — URINALYSIS, ROUTINE W REFLEX MICROSCOPIC
Bilirubin Urine: NEGATIVE
GLUCOSE, UA: NEGATIVE mg/dL
Ketones, ur: NEGATIVE mg/dL
LEUKOCYTES UA: NEGATIVE
NITRITE: NEGATIVE
PROTEIN: NEGATIVE mg/dL
Specific Gravity, Urine: 1.01 (ref 1.005–1.030)
pH: 6.5 (ref 5.0–8.0)

## 2017-09-03 LAB — OCCULT BLOOD X 1 CARD TO LAB, STOOL: FECAL OCCULT BLD: POSITIVE — AB

## 2017-09-03 LAB — URINALYSIS, MICROSCOPIC (REFLEX)

## 2017-09-03 MED ORDER — FAMOTIDINE IN NACL 20-0.9 MG/50ML-% IV SOLN
20.0000 mg | Freq: Once | INTRAVENOUS | Status: AC
  Administered 2017-09-03: 20 mg via INTRAVENOUS
  Filled 2017-09-03: qty 50

## 2017-09-03 MED ORDER — IOPAMIDOL (ISOVUE-300) INJECTION 61%
100.0000 mL | Freq: Once | INTRAVENOUS | Status: AC | PRN
  Administered 2017-09-03: 100 mL via INTRAVENOUS

## 2017-09-03 NOTE — ED Provider Notes (Signed)
[facility] EM[facility]Y DEPARTMENT Provider Note   CSN: [id number] Arrival date & time: 09/03/17  1144     History   Chief Complaint Chief Complaint  Patient presents with  . Rectal Bleeding    HPI Magin Balbi is a 67 y.o. female.  67 year old female with past medical history including stroke, esophageal cancer, hypertension, hyperlipidemia who presents with rectal bleeding.  Patient and sister who lives with the patient reports that the patient had with a think it was rectal bleeding this morning.  The patient wears depends and they noticed blood in her diaper today.  Patient states that it only happened one time.  They are not sure whether it could have been vaginal bleeding but she is not aware of any vaginal bleeding.  She denies any urinary symptoms.  She denies any fevers, vomiting, diarrhea, constipation, or abdominal pain. Earlier she endorsed nausea to triage nurse but denied nausea for me. No anticoagulant use.  Sister later noted that patient did actually have an episode of vomiting this morning.  The history is provided by the patient and a relative.  Rectal Bleeding    Past Medical History:  Diagnosis Date  . Anger   . Esophagus cancer ([place]) 04/07/2014  . 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 History of noncompliance with medical treatment   . Hypercholesteremia   . Hypertension   . Hypokalemia   . Mammogram abnormal   . Mechanical dysphagia 04/07/2014  . Radiation    54 Gy to distal esophagus  . Stroke [facility])     Patient Active Problem List   Diagnosis Date Noted  . Hypokalemia 05/20/2014  . Dysphagia   . Protein-calorie malnutrition, severe ([place]) 04/09/2014  . Esophagus cancer ([place]) 04/07/2014  . Mechanical dysphagia 04/07/2014  . Esophageal cancer ([place]) 04/07/2014    Past Surgical History:  Procedure Laterality Date  . IR REMOVAL TUN ACCESS W/ PORT W/O FL MOD SED  02/18/2017     OB History   None      Home Medications    Prior to  Admission medications   Medication Sig Start Date End Date Taking? Authorizing Provider  amLODipine (NORVASC) 10 MG tablet Take 0.5 tablets (5 mg total) by mouth daily. 12/15/14   Volanda Napoleon, MD  atorvastatin (LIPITOR) 40 MG tablet Take 40 mg by mouth every morning.  03/16/14   [provider]  clopidogrel (PLAVIX) 75 MG tablet Take 1 tablet (75 mg total) by mouth daily. 11/09/15   Volanda Napoleon, MD  famotidine (PEPCID) 20 MG tablet Take 1 tablet (20 mg total) by mouth 2 (two) times daily. 06/09/17   Charlesetta Shanks, MD  fexofenadine (ALLEGRA) 180 MG tablet Take 180 mg by mouth as needed.  04/27/14 04/27/15  [provider]  FLUoxetine (PROZAC) 10 MG capsule Take 1 capsule (10 mg total) by mouth daily. 08/13/16   Volanda Napoleon, MD  folic acid (FOLVITE) 1 MG tablet TAKE ONE TABLET BY MOUTH ONCE DAILY. (ROUND YELLOW TAB WITH V 31/62) 12/11/16   Volanda Napoleon, MD  levETIRAcetam (KEPPRA) 500 MG tablet Take 500 mg by mouth. 11/22/15 02/18/17  [provider]  losartan (COZAAR) 50 MG tablet Take 50 mg by mouth daily.    [provider]  magnesium gluconate (MAGONATE) 500 MG tablet Take 1 tablet (500 mg total) by mouth daily. 250 mg daily 11/09/15   Volanda Napoleon, MD  metoCLOPramide (REGLAN) 10 MG tablet Take 1 tablet (10 mg total) by mouth  3 (three) times daily before meals. 06/09/17   Charlesetta Shanks, MD  pantoprazole (PROTONIX) 40 MG tablet Take 1 tablet (40 mg total) by mouth daily. 01/11/15   Volanda Napoleon, MD  potassium chloride (KLOR-CON) 20 MEQ packet Take 40 mEq by mouth 2 (two) times daily. Take 2 packets by mouth 2 times daily for the next 3 days.  Then go back to your regular dose of 2 packets once daily. 06/09/17   Charlesetta Shanks, MD  potassium chloride SA (K-DUR,KLOR-CON) 20 MEQ tablet Take 2 tablets (40 mEq total) by mouth daily. 03/12/16   Volanda Napoleon, MD    Family History No family history on file.  Social History Social History   Tobacco Use   . Smoking status: Current Every Day Smoker    Packs/day: 0.25    Years: 45.00    Pack years: 11.25    Types: Cigarettes    Start date: 06/05/1968  . Smokeless tobacco: Never Used  . Tobacco comment: 06/18/16 still smoking  Substance Use Topics  . Alcohol use: No    Alcohol/week: 0.0 oz  . Drug use: No     Allergies   Patient has no known allergies.   Review of Systems Review of Systems  Gastrointestinal: Positive for hematochezia.   All other systems reviewed and are negative except that which was mentioned in HPI   Physical Exam Updated Vital Signs BP (!) 159/66 (BP Location: Right Arm)   Pulse 89   Temp 98.5 F (36.9 C) (Oral)   Resp 18   Ht 5\' 6"  (1.676 m)   Wt 77.9 kg (171 lb 11.8 oz)   SpO2 100%   BMI 27.72 kg/m   Physical Exam  Constitutional: She is oriented to person, place, and time. She appears well-developed and well-nourished. No distress.  HENT:  Head: Normocephalic and atraumatic.  Moist mucous membranes  Eyes: Conjunctivae are normal.  Neck: Neck supple.  Cardiovascular: Normal rate, regular rhythm and normal heart sounds.  No murmur heard. Pulmonary/Chest: Effort normal and breath sounds normal.  Abdominal: Soft. Bowel sounds are normal. She exhibits no distension. There is tenderness. There is no rebound and no guarding.  Mild right mid-abdominal tenderness  Genitourinary:  Genitourinary Comments: No external hemorrhoids, no gross blood or melena on rectal exam; no obvious bleeding from external vagina  Musculoskeletal: She exhibits no edema.  Neurological: She is alert and oriented to person, place, and time.  Fluent speech  Skin: Skin is warm and dry.  Psychiatric: Her behavior is normal.  Nursing note and vitals reviewed. Chaperone was present during exam.    ED Treatments / Results  Labs (all labs ordered are listed, but only abnormal results are displayed) Labs Reviewed  CBC - Abnormal; Notable for the following components:       Result Value   WBC 16.3 (*)    RBC 3.57 (*)    Hemoglobin 10.6 (*)    HCT 31.4 (*)    All other components within normal limits  URINALYSIS, ROUTINE W REFLEX MICROSCOPIC - Abnormal; Notable for the following components:   APPearance CLOUDY (*)    Hgb urine dipstick SMALL (*)    All other components within normal limits  OCCULT BLOOD X 1 CARD TO LAB, STOOL - Abnormal; Notable for the following components:   Fecal Occult Bld POSITIVE (*)    All other components within normal limits  URINALYSIS, MICROSCOPIC (REFLEX) - Abnormal; Notable for the following components:   Bacteria, UA FEW (*)  All other components within normal limits  COMPREHENSIVE METABOLIC PANEL    EKG None  Radiology No results found.  Procedures Procedures (including critical care time)  Medications Ordered in ED Medications - No data to display   Initial Impression / Assessment and Plan / ED Course  I have reviewed the triage vital signs and the nursing notes.  Pertinent labs & imaging results that were available during my care of the patient were reviewed by me and considered in my medical decision making (see chart for details).     Pt comfortable on exam w/ reassuring VS. She had no frank blood or melena on rectal exam.  Stool was Hemoccult positive.  Lab work shows potassium 3.1, BUN 30, creatinine 1.21, similar to previous, WBC 16.3, hemoglobin 10.6 which is similar to previous.  Her urine shows no evidence of infection. On repeat exam, she is mildly tender on right side. Given bleeding and leukocytosis, ddx includes diverticulitis. Will obtain CT abd/pelvis to evaluate for diverticulitis.  If this scan is negative and patient's blood count remains stable, I feel that she is appropriate for close follow-up with her gastroenterologist as she has not had any bleeding here her hemoglobin is stable from previous.  I am signing out to the oncoming provider, Dr. Laverta Baltimore, who will follow up on imaging. Final  Clinical Impressions(s) / ED Diagnoses   Final diagnoses:  None    ED Discharge Orders    None       Kodi Steil, Wenda Overland, MD 09/03/17 [phone]

## 2017-09-03 NOTE — ED Notes (Signed)
ED Provider at bedside.  Performed rectal and vaginal exam.  RN present.  Tolerated well

## 2017-09-03 NOTE — ED Provider Notes (Signed)
Blood pressure (!) 159/66, pulse 89, temperature 98.5 F (36.9 C), temperature source Oral, resp. rate 18, height 5\' 6"  (1.676 m), weight 77.9 kg (171 lb 11.8 oz), SpO2 100 %.  Assuming care from Dr. Rex Kras.  In short, Ann Bohne is a 67 y.o. female with a chief complaint of Rectal Bleeding .  Refer to the original H&P for additional details.  The current plan of care is to f/u CT and repeat H/H.  CT reviewed with no acute findings. Updated family regarding scan and repeat Hb which shows 1 gm decrease in Hb from arrival labs. No additional bloody BMs in the ED. Spoke with GI, Dr. Hilarie Fredrickson who agrees with plan for obs overnight and they will consult in the AM. Famotidine started in the ED.   Discussed patient's case with Hospitalist to request admission. Patient and family (if present) updated with plan. Care transferred to Hospitalist service.  I reviewed all nursing notes, vitals, pertinent old records, EKGs, labs, imaging (as available).  Nanda Quinton, MD   Margette Fast, MD 09/04/17 [phone]

## 2017-09-03 NOTE — ED Notes (Signed)
Pt eating a frozen dinner meatloaf and two coca cola drinks

## 2017-09-03 NOTE — ED Triage Notes (Signed)
Per pt and sister assist (pt lives with sister/caretaker) -pt with rectal vs vaginal bleeding this am-started approx 430am-wears depends-pt NAD-steady gait

## 2017-09-04 ENCOUNTER — Encounter (HOSPITAL_COMMUNITY): Payer: Self-pay | Admitting: Family Medicine

## 2017-09-04 ENCOUNTER — Observation Stay (HOSPITAL_BASED_OUTPATIENT_CLINIC_OR_DEPARTMENT_OTHER): Payer: Medicare Other

## 2017-09-04 DIAGNOSIS — K922 Gastrointestinal hemorrhage, unspecified: Secondary | ICD-10-CM | POA: Diagnosis not present

## 2017-09-04 DIAGNOSIS — R1310 Dysphagia, unspecified: Secondary | ICD-10-CM

## 2017-09-04 DIAGNOSIS — Z8501 Personal history of malignant neoplasm of esophagus: Secondary | ICD-10-CM | POA: Diagnosis not present

## 2017-09-04 DIAGNOSIS — G40909 Epilepsy, unspecified, not intractable, without status epilepticus: Secondary | ICD-10-CM | POA: Diagnosis not present

## 2017-09-04 DIAGNOSIS — K921 Melena: Secondary | ICD-10-CM | POA: Diagnosis not present

## 2017-09-04 DIAGNOSIS — Z7902 Long term (current) use of antithrombotics/antiplatelets: Secondary | ICD-10-CM | POA: Diagnosis not present

## 2017-09-04 DIAGNOSIS — K5731 Diverticulosis of large intestine without perforation or abscess with bleeding: Secondary | ICD-10-CM | POA: Diagnosis not present

## 2017-09-04 DIAGNOSIS — D62 Acute posthemorrhagic anemia: Secondary | ICD-10-CM | POA: Diagnosis not present

## 2017-09-04 LAB — CBC
HEMATOCRIT: 27.9 % — AB (ref 36.0–46.0)
Hemoglobin: 9.3 g/dL — ABNORMAL LOW (ref 12.0–15.0)
MCH: 29.4 pg (ref 26.0–34.0)
MCHC: 33.3 g/dL (ref 30.0–36.0)
MCV: 88.3 fL (ref 78.0–100.0)
PLATELETS: 243 10*3/uL (ref 150–400)
RBC: 3.16 MIL/uL — ABNORMAL LOW (ref 3.87–5.11)
RDW: 15.4 % (ref 11.5–15.5)
WBC: 12.6 10*3/uL — AB (ref 4.0–10.5)

## 2017-09-04 LAB — I-STAT VENOUS BLOOD GAS, ED
ACID-BASE EXCESS: 7 mmol/L — AB (ref 0.0–2.0)
Bicarbonate: 32.7 mmol/L — ABNORMAL HIGH (ref 20.0–28.0)
O2 SAT: 32 %
PCO2 VEN: 52 mmHg (ref 44.0–60.0)
PH VEN: 7.406 (ref 7.250–7.430)
TCO2: 34 mmol/L — AB (ref 22–32)
pO2, Ven: 20 mmHg — CL (ref 32.0–45.0)

## 2017-09-04 LAB — BASIC METABOLIC PANEL
ANION GAP: 11 (ref 5–15)
BUN: 18 mg/dL (ref 6–20)
CALCIUM: 8.6 mg/dL — AB (ref 8.9–10.3)
CO2: 28 mmol/L (ref 22–32)
Chloride: 102 mmol/L (ref 101–111)
Creatinine, Ser: 0.92 mg/dL (ref 0.44–1.00)
GLUCOSE: 132 mg/dL — AB (ref 65–99)
POTASSIUM: 2.6 mmol/L — AB (ref 3.5–5.1)
SODIUM: 141 mmol/L (ref 135–145)

## 2017-09-04 LAB — CBG MONITORING, ED: GLUCOSE-CAPILLARY: 130 mg/dL — AB (ref 65–99)

## 2017-09-04 MED ORDER — LOSARTAN POTASSIUM 50 MG PO TABS
50.0000 mg | ORAL_TABLET | Freq: Every day | ORAL | Status: DC
  Administered 2017-09-04 – 2017-09-06 (×3): 50 mg via ORAL
  Filled 2017-09-04: qty 1
  Filled 2017-09-04: qty 2
  Filled 2017-09-04: qty 1

## 2017-09-04 MED ORDER — POTASSIUM CHLORIDE CRYS ER 20 MEQ PO TBCR
40.0000 meq | EXTENDED_RELEASE_TABLET | Freq: Once | ORAL | Status: AC
  Administered 2017-09-04: 40 meq via ORAL
  Filled 2017-09-04: qty 2

## 2017-09-04 MED ORDER — ATORVASTATIN CALCIUM 40 MG PO TABS
40.0000 mg | ORAL_TABLET | Freq: Every morning | ORAL | Status: DC
  Administered 2017-09-05 – 2017-09-07 (×3): 40 mg via ORAL
  Filled 2017-09-04 (×3): qty 1

## 2017-09-04 MED ORDER — POTASSIUM CHLORIDE 20 MEQ PO PACK
40.0000 meq | PACK | Freq: Every day | ORAL | Status: DC
  Administered 2017-09-05 – 2017-09-07 (×3): 40 meq via ORAL
  Filled 2017-09-04 (×3): qty 2

## 2017-09-04 MED ORDER — AMLODIPINE BESYLATE 5 MG PO TABS
5.0000 mg | ORAL_TABLET | Freq: Every day | ORAL | Status: DC
  Administered 2017-09-04 – 2017-09-06 (×3): 5 mg via ORAL
  Filled 2017-09-04 (×3): qty 1

## 2017-09-04 MED ORDER — DEXTROSE-NACL 5-0.45 % IV SOLN
INTRAVENOUS | Status: DC
  Administered 2017-09-04 – 2017-09-05 (×3): via INTRAVENOUS

## 2017-09-04 MED ORDER — FLUOXETINE HCL 10 MG PO CAPS
10.0000 mg | ORAL_CAPSULE | Freq: Every day | ORAL | Status: DC
  Administered 2017-09-05 – 2017-09-07 (×3): 10 mg via ORAL
  Filled 2017-09-04 (×4): qty 1

## 2017-09-04 MED ORDER — LEVETIRACETAM 500 MG PO TABS
500.0000 mg | ORAL_TABLET | Freq: Two times a day (BID) | ORAL | Status: DC
  Administered 2017-09-04 – 2017-09-07 (×7): 500 mg via ORAL
  Filled 2017-09-04 (×7): qty 1

## 2017-09-04 MED ORDER — MAGNESIUM GLUCONATE 500 MG PO TABS
250.0000 mg | ORAL_TABLET | Freq: Every day | ORAL | Status: DC
  Administered 2017-09-05 – 2017-09-07 (×3): 250 mg via ORAL
  Filled 2017-09-04 (×3): qty 1

## 2017-09-04 MED ORDER — FOLIC ACID 1 MG PO TABS
1.0000 mg | ORAL_TABLET | Freq: Every day | ORAL | Status: DC
  Administered 2017-09-04 – 2017-09-07 (×4): 1 mg via ORAL
  Filled 2017-09-04 (×4): qty 1

## 2017-09-04 MED ORDER — POTASSIUM CHLORIDE 10 MEQ/100ML IV SOLN
10.0000 meq | INTRAVENOUS | Status: AC
  Administered 2017-09-04: 10 meq via INTRAVENOUS
  Filled 2017-09-04: qty 100

## 2017-09-04 MED ORDER — FAMOTIDINE 20 MG PO TABS
20.0000 mg | ORAL_TABLET | Freq: Two times a day (BID) | ORAL | Status: DC
  Administered 2017-09-04 – 2017-09-07 (×7): 20 mg via ORAL
  Filled 2017-09-04 (×7): qty 1

## 2017-09-04 NOTE — H&P (Signed)
History and Physical    Brittainy Bucker SEG:[id number] DOB: 1951/03/30 DOA: 09/03/2017  PCP: Beckie Salts, MD Patient coming from: Home  Chief Complaint: Bright red blood per rectum  HPI: Sheila Young is a 67 y.o. female with medical history significant of CVA, dysphagia, esophageal cancer, hypokalemia. Patient has had a one day history of bright red blood per rectum. She had one episode. She has never had GI bleeding in the past. No associated abdominal pain, nausea, or vomiting. No bowel movement today.  ED Course: Vitals: Afebrile, normal heart rate, normal respirations, normotensive to slightly hypertensive, on room air. Labs: Potassium of 2.6, WBC of 12.6 k, hemoglobin of 10.6>9.5>9.3 Imaging: Chest x-ray significant for no active disease. CT abdomen/pelvis significant for diverticulosis, ?cirrhosis Medications/Course: Potassium supplementation  Review of Systems: Review of Systems  Cardiovascular: Negative for chest pain and palpitations.  Gastrointestinal: Positive for blood in stool. Negative for abdominal pain, melena, nausea and vomiting.  All other systems reviewed and are negative.   Past Medical History:  Diagnosis Date  . Anger   . Esophagus cancer ([place]) 04/07/2014  . 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 History of noncompliance with medical treatment   . Hypercholesteremia   . Hypertension   . Hypokalemia   . Mammogram abnormal   . Mechanical dysphagia 04/07/2014  . Radiation    54 Gy to distal esophagus  . Stroke [facility])     Past Surgical History:  Procedure Laterality Date  . IR REMOVAL TUN ACCESS W/ PORT W/O FL MOD SED  02/18/2017     reports that she has been smoking cigarettes.  She started smoking about 49 years ago. She has a 11.25 pack-year smoking history. She has never used smokeless tobacco. She reports that she does not drink alcohol or use drugs.  No Known Allergies  Family History  Problem Relation Age of Onset  . GI Bleed Neg Hx   . GI Disease  Neg Hx   . Stroke Neg Hx     Prior to Admission medications   Medication Sig Start Date End Date Taking? Authorizing Provider  amLODipine (NORVASC) 10 MG tablet Take 0.5 tablets (5 mg total) by mouth daily. Patient taking differently: Take 10 mg by mouth daily.  12/15/14  Yes Volanda Napoleon, MD  atorvastatin (LIPITOR) 40 MG tablet Take 40 mg by mouth every morning.  03/16/14  Yes [provider]  clopidogrel (PLAVIX) 75 MG tablet Take 1 tablet (75 mg total) by mouth daily. 11/09/15  Yes Volanda Napoleon, MD  famotidine (PEPCID) 20 MG tablet Take 1 tablet (20 mg total) by mouth 2 (two) times daily. 06/09/17  Yes Charlesetta Shanks, MD  FLUoxetine (PROZAC) 10 MG capsule Take 1 capsule (10 mg total) by mouth daily. 08/13/16  Yes Volanda Napoleon, MD  losartan (COZAAR) 50 MG tablet Take 50 mg by mouth daily.   Yes [provider]  fexofenadine (ALLEGRA) 180 MG tablet Take 180 mg by mouth as needed for allergies.  04/27/14 09/04/17  [provider]  folic acid (FOLVITE) 1 MG tablet TAKE ONE TABLET BY MOUTH ONCE DAILY. (ROUND YELLOW TAB WITH V 31/62) 12/11/16   Volanda Napoleon, MD  levETIRAcetam (KEPPRA) 500 MG tablet Take 500 mg by mouth 2 (two) times daily.  11/22/15 02/18/17  [provider]  magnesium gluconate (MAGONATE) 500 MG tablet Take 1 tablet (500 mg total) by mouth daily. 250 mg daily 11/09/15   Volanda Napoleon, MD  metoCLOPramide (REGLAN) 10 MG tablet  Take 1 tablet (10 mg total) by mouth 3 (three) times daily before meals. 06/09/17   Charlesetta Shanks, MD  pantoprazole (PROTONIX) 40 MG tablet Take 1 tablet (40 mg total) by mouth daily. 01/11/15   Volanda Napoleon, MD  potassium chloride (KLOR-CON) 20 MEQ packet Take 40 mEq by mouth 2 (two) times daily. Take 2 packets by mouth 2 times daily for the next 3 days.  Then go back to your regular dose of 2 packets once daily. 06/09/17   Charlesetta Shanks, MD  potassium chloride SA (K-DUR,KLOR-CON) 20 MEQ tablet Take 2 tablets (40 mEq  total) by mouth daily. 03/12/16   Volanda Napoleon, MD    Physical Exam: Vitals:   09/04/17 0900 09/04/17 1123 09/04/17 1200 09/04/17 1333  BP: (!) 167/78 138/84 (!) 167/91 (!) 145/79  Pulse: 74 80 82 80  Resp: 14 18 14 14   Temp:    98.4 F (36.9 C)  TempSrc:    Oral  SpO2: 100% 100% 93% 100%  Weight:      Height:         Constitutional: NAD, calm, comfortable Eyes: PERRL, lids and conjunctivae normal ENMT: Mucous membranes are moist. Posterior pharynx clear of any exudate or lesions. Neck: normal, supple, no masses, no thyromegaly Respiratory: clear to auscultation bilaterally, no wheezing, no crackles. Normal respiratory effort. No accessory muscle use.  Cardiovascular: Regular rate and rhythm, no murmurs / rubs / gallops. No extremity edema.   Abdomen: no tenderness, no masses palpated. No hepatosplenomegaly. Bowel sounds positive.  Musculoskeletal: no clubbing / cyanosis. No joint deformity upper and lower extremities. Good ROM, no contractures. Normal muscle tone.  Skin: no rashes, lesions, ulcers. No induration Neurologic: CN 2-12 grossly intact. Sensation intact, DTR normal. Strength 5/5 in all 4.  Psychiatric: Normal judgment and insight. Alert and oriented x 3. Normal mood.   Labs on Admission: I have personally reviewed following labs and imaging studies  CBC: Recent Labs  Lab 09/03/17 1430 09/03/17 2009 09/04/17 0818  WBC 16.3* 13.2* 12.6*  NEUTROABS 12.0*  --   --   HGB 10.6* 9.5* 9.3*  HCT 31.4* 28.2* 27.9*  MCV 88.0 87.6 88.3  PLT 229 239 563   Basic Metabolic Panel: Recent Labs  Lab 09/03/17 1430 09/04/17 0927  NA 143 141  K 3.1* 2.6*  CL 106 102  CO2 27 28  GLUCOSE 119* 132*  BUN 30* 18  CREATININE 1.21* 0.92  CALCIUM 9.4 8.6*   GFR: Estimated Creatinine Clearance: 63.3 mL/min (by C-G formula based on SCr of 0.92 mg/dL). Liver Function Tests: Recent Labs  Lab 09/03/17 1430  AST 26  ALT 20  ALKPHOS 73  BILITOT 0.5  PROT 7.3  ALBUMIN  3.7   No results for input(s): LIPASE, AMYLASE in the last 168 hours. No results for input(s): AMMONIA in the last 168 hours. Coagulation Profile: No results for input(s): INR, PROTIME in the last 168 hours. Cardiac Enzymes: No results for input(s): CKTOTAL, CKMB, CKMBINDEX, TROPONINI in the last 168 hours. BNP (last 3 results) No results for input(s): PROBNP in the last 8760 hours. HbA1C: No results for input(s): HGBA1C in the last 72 hours. CBG: Recent Labs  Lab 09/04/17 0807  GLUCAP 130*   Lipid Profile: No results for input(s): CHOL, HDL, LDLCALC, TRIG, CHOLHDL, LDLDIRECT in the last 72 hours. Thyroid Function Tests: No results for input(s): TSH, T4TOTAL, FREET4, T3FREE, THYROIDAB in the last 72 hours. Anemia Panel: No results for input(s): VITAMINB12, FOLATE, FERRITIN, TIBC,  IRON, RETICCTPCT in the last 72 hours. Urine analysis:    Component Value Date/Time   COLORURINE YELLOW 09/03/2017 1441   APPEARANCEUR CLOUDY (A) 09/03/2017 1441   LABSPEC 1.010 09/03/2017 1441   PHURINE 6.5 09/03/2017 1441   GLUCOSEU NEGATIVE 09/03/2017 1441   HGBUR SMALL (A) 09/03/2017 1441   BILIRUBINUR NEGATIVE 09/03/2017 1441   KETONESUR NEGATIVE 09/03/2017 1441   PROTEINUR NEGATIVE 09/03/2017 1441   NITRITE NEGATIVE 09/03/2017 1441   LEUKOCYTESUR NEGATIVE 09/03/2017 1441   Sepsis Labs: !!!!!!!!!!!!!!!!!!!!!!!!!!!!!!!!!!!!!!!!!!!! @LABRCNTIP (procalcitonin:4,lacticidven:4) )No results found for this or any previous visit (from the past 240 hour(s)).   Radiological Exams on Admission: Ct Abdomen Pelvis W Contrast  Result Date: 09/03/2017 CLINICAL DATA:  Abdominal pain, diverticulitis suspected. EXAM: CT ABDOMEN AND PELVIS WITH CONTRAST TECHNIQUE: Multidetector CT imaging of the abdomen and pelvis was performed using the standard protocol following bolus administration of intravenous contrast. CONTRAST:  1100mL ISOVUE-300 IOPAMIDOL (ISOVUE-300) INJECTION 61% COMPARISON:  PET-CT 12/15/2014  FINDINGS: Lower chest: Paramediastinal pleural thickening and lung reticulation likely from prior esophageal cancer treatment. Cardiomegaly. Atherosclerosis. No masslike finding. Hepatobiliary: Lobulated liver surface, suspect cirrhosis. Negative for mass.No evidence of biliary obstruction or stone. Pancreas: Unremarkable. Spleen: Unremarkable. Adrenals/Urinary Tract: Somewhat heterogeneous left adrenal mass measuring 3 cm, but essentially stable from 2016 and thus attributed to adenoma. Atrophy of the malrotated left kidney. 3 mm right renal stone. Left renal hilar calcifications appear vascular. 13 mm left renal cyst. Decompressed urinary bladder that is otherwise unremarkable. Stomach/Bowel: Extensive colonic diverticulosis, greatest along the proximal and sigmoid colon. No inflammatory changes or obstruction. No appendicitis. Scarring related to prior percutaneous gastrostomy. Incidental submucosal lipoma at the hepatic flexure, 16 mm in maximal span. Vascular/Lymphatic: Extensive atherosclerotic plaque of the aorta and visceral branches. Probable right SFA occlusion, certainty limited by contrast timing. No mass or adenopathy. Reproductive:2.7 cm fundal fibroid which is likely regressed from 2016. Other: No ascites or pneumoperitoneum. Musculoskeletal: No acute abnormalities. IMPRESSION: 1. No acute finding. 2. Liver lobulation concerning for cirrhosis. Please correlate for risk factors. 3. Colonic diverticulosis. 4. Aortic Atherosclerosis (ICD10-I70.0), that is extensive. Probable occlusion of the right SFA. Electronically Signed   By: Monte Fantasia M.D.   On: 09/03/2017 16:58   Dg Chest Port 1 View  Result Date: 09/04/2017 CLINICAL DATA:  Transient hypoxia.  Rectal bleeding. EXAM: PORTABLE CHEST 1 VIEW COMPARISON:  06/09/2017 FINDINGS: The cardiac silhouette is upper limits of normal in size, unchanged. Aortic atherosclerosis is noted. Small right pleural effusion on the prior study has resolved. No  airspace consolidation, edema, or pneumothorax is identified. No acute osseous abnormality is seen. IMPRESSION: No active disease. Electronically Signed   By: Logan Bores M.D.   On: 09/04/2017 08:44    Assessment/Plan Active Problems:   GI bleed   Bright red blood per rectum One episode.  Mild decrease of hemoglobin from in March 2019 but appears baseline is around 10-12.  Patient with a history of diverticulosis which is likely etiology. -GI recommendations -Repeat CBC in a.m. unless has recurrent blood per rectum -Hold Plavix  Acute blood loss anemia Secondary to GI bleeding which is likely diverticular.  Stable.  History of CVA On statin and Plavix as an outpatient. -Hold Plavix and continue Lipitor  Hypokalemia Chronic.  Patient is on potassium supplementation as an outpatient. -Supplementation while inpatient -Check magnesium  Depression Patient with some flat affect. -Continue Prozac  Seizure disorder Stable. -Continue Keppra  Dysphagia Patient is followed by GI at [facility] as an outpatient.  She states she has no issues with swallowing. Plan for EGD and dilatation  Esophageal cancer S/p chemo/radiation. Not on active treatment.   DVT prophylaxis: SCDs Code Status: Full code Family Communication: None at bedside Disposition Plan: Discharge pending GI management/recommendations Consults called: Gastroenterology Admission status: Observation, telemetry   Cordelia Poche, MD [facility] Pager [phone]  If 7PM-7AM, please contact night-coverage [email or web address] Password [email or web address]  09/04/2017, 3:14 PM

## 2017-09-04 NOTE — ED Notes (Signed)
Placed on Oxygen at 2L/min via [place]

## 2017-09-04 NOTE — ED Notes (Signed)
K+ 2.6 results given to ED MD and primary RN

## 2017-09-04 NOTE — ED Notes (Signed)
ED Provider at bedside. 

## 2017-09-04 NOTE — Consult Note (Addendum)
Consultation  Referring Provider: ER MD - Nanda Quinton MD Primary Care Physician:  Beckie Salts, MD Primary Gastroenterologist:  Dr.Hurlebrink - [place] /WF  Reason for Consultation:  GI Bleed  HPI: Sheila Young is a 67 y.o. female known to [place] /gastroenterology/[facility] who presented to the emergency room last evening with complaints of rectal bleeding, onset yesterday.  She was brought in by her family who stated they noticed blood in her diaper. Patient is maintained on Plavix, with history of previous CVA.  She has history of memory loss, seizure disorder, depression, hyperlipidemia and chronic kidney disease. 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  Patient underwent a course of chemotherapy and radiation which she completed in February 2016 and has been followed through oncology here at [facility]. She did have a barium swallow done in March 2019 after she complained of dysphasia and was noted to have an irregular stricture of the distal esophagus measuring up to 8 mm in maximal diameter and spanning a length of 3.6 cm This is felt likely secondary to prior radiation.  A barium tablet would not pass through this area.  There was no evidence of recurrent ulceration or mass. She was subsequently seen by [facility] Dr. Dorrene German and is scheduled for upper endoscopy with dilation.  Her last colonoscopy was done in 2014 with finding of diverticulosis.  5-year interval follow-up was recommended secondary to family history. Patient had a CT of the abdomen and pelvis done last evening through the emergency room which showed probable cirrhosis, no evidence of esophageal mass or surrounding adenopathy.  She has a 3 cm left adrenal mass which has been stable since 2016 and felt to represent an adenoma.  Also with extensive diverticulosis of the left colon.  Most recent labs April 2019  hemoglobin 12.6 hematocrit of 38.  On arrival to the emergency room WBC of 16.3 hemoglobin 10.6 hematocrit of 31.4.  Today WBC 13.2, hemoglobin 9.5 and potassium of 2.6.  At 8 AM hemoglobin 9.3 hematocrit of 27.9. She was transferred to [facility] long this afternoon.   Pt does not believe she has had any bleeding today . ER notes last night do not indicate any active bleeding.  She denies any abdominal pain, or cramping . She ate solid food while in the ER awaiting transfer.    Past Medical History:  Diagnosis Date  . Anger   . Esophagus cancer ([place]) 04/07/2014  . 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 History of noncompliance with medical treatment   . Hypercholesteremia   . Hypertension   . Hypokalemia   . Mammogram abnormal   . Mechanical dysphagia 04/07/2014  . Radiation    54 Gy to distal esophagus  . Stroke [facility])     Past Surgical History:  Procedure Laterality Date  . IR REMOVAL TUN ACCESS W/ PORT W/O FL MOD SED  02/18/2017    Prior to Admission medications   Medication Sig Start Date End Date Taking? Authorizing Provider  amLODipine (NORVASC) 10 MG tablet Take 0.5 tablets (5 mg total) by mouth daily. Patient taking differently: Take 10 mg by mouth daily.  12/15/14  Yes Volanda Napoleon, MD  atorvastatin (LIPITOR) 40 MG tablet Take 40 mg by mouth every morning.  03/16/14  Yes [provider]  clopidogrel (PLAVIX) 75 MG tablet Take 1 tablet (75 mg total) by mouth daily. 11/09/15  Yes  Volanda Napoleon, MD  famotidine (PEPCID) 20 MG tablet Take 1 tablet (20 mg total) by mouth 2 (two) times daily. 06/09/17  Yes Charlesetta Shanks, MD  FLUoxetine (PROZAC) 10 MG capsule Take 1 capsule (10 mg total) by mouth daily. 08/13/16  Yes Volanda Napoleon, MD  losartan (COZAAR) 50 MG tablet Take 50 mg by mouth daily.   Yes [provider]  fexofenadine (ALLEGRA) 180 MG tablet Take 180 mg by mouth as needed for allergies.  04/27/14 09/04/17  [provider]  folic acid (FOLVITE) 1 MG tablet  TAKE ONE TABLET BY MOUTH ONCE DAILY. (ROUND YELLOW TAB WITH V 31/62) 12/11/16   Volanda Napoleon, MD  levETIRAcetam (KEPPRA) 500 MG tablet Take 500 mg by mouth 2 (two) times daily.  11/22/15 02/18/17  [provider]  magnesium gluconate (MAGONATE) 500 MG tablet Take 1 tablet (500 mg total) by mouth daily. 250 mg daily 11/09/15   Volanda Napoleon, MD  metoCLOPramide (REGLAN) 10 MG tablet Take 1 tablet (10 mg total) by mouth 3 (three) times daily before meals. 06/09/17   Charlesetta Shanks, MD  pantoprazole (PROTONIX) 40 MG tablet Take 1 tablet (40 mg total) by mouth daily. 01/11/15   Volanda Napoleon, MD  potassium chloride (KLOR-CON) 20 MEQ packet Take 40 mEq by mouth 2 (two) times daily. Take 2 packets by mouth 2 times daily for the next 3 days.  Then go back to your regular dose of 2 packets once daily. 06/09/17   Charlesetta Shanks, MD  potassium chloride SA (K-DUR,KLOR-CON) 20 MEQ tablet Take 2 tablets (40 mEq total) by mouth daily. 03/12/16   Volanda Napoleon, MD    Current Facility-Administered Medications  Medication Dose Route Frequency Provider Last Rate Last Dose  . amLODipine (NORVASC) tablet 5 mg  5 mg Oral Daily Mariel Aloe, MD   5 mg at 09/04/17 0959  . [START ON 09/05/2017] atorvastatin (LIPITOR) tablet 40 mg  40 mg Oral q morning - 10a Mariel Aloe, MD      . dextrose 5 %-0.45 % sodium chloride infusion   Intravenous Continuous Mariel Aloe, MD      . famotidine (PEPCID) tablet 20 mg  20 mg Oral BID Mariel Aloe, MD   20 mg at 09/04/17 1000  . FLUoxetine (PROZAC) capsule 10 mg  10 mg Oral Daily Mariel Aloe, MD      . folic acid (FOLVITE) tablet 1 mg  1 mg Oral Daily Mariel Aloe, MD      . levETIRAcetam (KEPPRA) tablet 500 mg  500 mg Oral BID Mariel Aloe, MD   500 mg at 09/04/17 1000  . losartan (COZAAR) tablet 50 mg  50 mg Oral Daily Mariel Aloe, MD   50 mg at 09/04/17 1000  . magnesium gluconate (MAGONATE) tablet 500 mg  500 mg Oral Daily Mariel Aloe, MD         Allergies as of 09/03/2017  . (No Known Allergies)    Family History  Problem Relation Age of Onset  . GI Bleed Neg Hx   . GI Disease Neg Hx   . Stroke Neg Hx     Social History   Socioeconomic History  . Marital status: Single    Spouse name: Not on file  . Number of children: Not on file  . Years of education: Not on file  . Highest education level: Not on file  Occupational History  . Not on  file  Social Needs  . Financial resource strain: Not on file  . Food insecurity:    Worry: Not on file    Inability: Not on file  . Transportation needs:    Medical: Not on file    Non-medical: Not on file  Tobacco Use  . Smoking status: Current Every Day Smoker    Packs/day: 0.25    Years: 45.00    Pack years: 11.25    Types: Cigarettes    Start date: 06/05/1968  . Smokeless tobacco: Never Used  . Tobacco comment: 06/18/16 still smoking  Substance and Sexual Activity  . Alcohol use: No    Alcohol/week: 0.0 oz  . Drug use: No  . Sexual activity: Not on file  Lifestyle  . Physical activity:    Days per week: Not on file    Minutes per session: Not on file  . Stress: Not on file  Relationships  . Social connections:    Talks on phone: Not on file    Gets together: Not on file    Attends religious service: Not on file    Active member of club or organization: Not on file    Attends meetings of clubs or organizations: Not on file    Relationship status: Not on file  . Intimate partner violence:    Fear of current or ex partner: Not on file    Emotionally abused: Not on file    Physically abused: Not on file    Forced sexual activity: Not on file  Other Topics Concern  . Not on file  Social History Narrative  . Not on file    Review of Systems: Pertinent positive and negative review of systems were noted in the above HPI section.  All other review of systems was otherwise negative.  Physical Exam: Vital signs in last 24 hours: Temp:  [98.3 F (36.8  C)-98.5 F (36.9 C)] 98.4 F (36.9 C) (05/30 1333) Pulse Rate:  [66-121] 80 (05/30 1333) Resp:  [14-27] 14 (05/30 1333) BP: (114-171)/(67-97) 145/79 (05/30 1333) SpO2:  [87 %-100 %] 100 % (05/30 1333) Last BM Date: 09/03/17 General:   Alert,  Well-developed, well-nourished, pleasant and cooperative in NAD Head:  Normocephalic and atraumatic. Eyes:  Sclera clear, no icterus.   Conjunctiva pink. Ears:  Normal auditory acuity. Nose:  No deformity, discharge,  or lesions. Mouth:  No deformity or lesions.   Neck:  Supple; no masses or thyromegaly. Lungs:  Clear throughout to auscultation.   No wheezes, crackles, or rhonchi. Heart:  Regular rate and rhythm; no murmurs, clicks, rubs,  or gallops. Abdomen:  Soft,nontender, BS active,nonpalp mass or hsm.   Rectal:  Deferred  Msk:  Symmetrical without gross deformities. . Pulses:  Normal pulses noted. Extremities:  Without clubbing or edema. Neurologic:  Alert and  oriented x4;  grossly normal neurologically. Skin:  Intact without significant lesions or rashes.. Psych:  Alert and cooperative. Normal mood and affect.  Intake/Output from previous day: No intake/output data recorded. Intake/Output this shift: No intake/output data recorded.  Lab Results: Recent Labs    09/03/17 1430 09/03/17 2009 09/04/17 0818  WBC 16.3* 13.2* 12.6*  HGB 10.6* 9.5* 9.3*  HCT 31.4* 28.2* 27.9*  PLT 229 239 243   BMET Recent Labs    09/03/17 1430 09/04/17 0927  NA 143 141  K 3.1* 2.6*  CL 106 102  CO2 27 28  GLUCOSE 119* 132*  BUN 30* 18  CREATININE 1.21* 0.92  CALCIUM 9.4  8.6*   LFT Recent Labs    09/03/17 1430  PROT 7.3  ALBUMIN 3.7  AST 26  ALT 20  ALKPHOS 73  BILITOT 0.5   PT/INR No results for input(s): LABPROT, INR in the last 72 hours. Hepatitis Panel No results for input(s): HEPBSAG, HCVAB, HEPAIGM, HEPBIGM in the last 72 hours.    IMPRESSION:   #60  67 year old African-American female admitted after presenting with  rectal bleeding with bright red blood.  Patient is a poor historian post CVA and cared for by her sister.  To my knowledge patient has  only had one episode of bloody stool Rectal exam by ER MD last evening with no gross blood or melena noted  Patient has had drop in hemoglobin from 10.6 to 9.3. She has previously documented diverticulosis on colonoscopy done 2014 in [facility]. CT imaging done last evening also shows extensive left colon diverticulosis.  Patient very likely has had a self-limited diverticular bleed  #2 chronic antiplatelet therapy-on Plavix-this may be on hold now as she was being scheduled for an outpatient EGD with esophageal dilation through [facility]/Dr. Dorrene German #3 history of squamous cell CA of the esophagus diagnosed 2015, status post chemotherapy and radiation. #4.  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  Depression #9.  Chronic kidney disease  Plan; Full liquid diet Serial hemoglobins evening and in a.m. Transfuse for hemoglobin less than 8 Hold Plavix-hospital team may need to communicate with her regular gastroenterologist regarding when upcoming EGD is scheduled for an plan to leave her off Plavix until after that procedure completed.  Patient is already established with gastroenterologist in [place]/Dr. Rhoton, and is apparently scheduled for upcoming endoscopy with esophageal dilation. She may need repeat colonoscopy done some point in the near future due to family history, and would defer to her regular gastroenterologist to do outpatient.  Amy Esterwood  09/04/2017, 3:40 PM   GI ATTENDING  History, allergies, x-rays, outside endoscopy and GI evaluations reviewed. Agree with comprehensive consultation note. Hemodynamically stable painless rectal  bleeding which appears to be minor. Suspect diverticular. On Plavix.Agree with supportive care. At discharge she will resume care with her primary GI physicians in [facility].  Docia Chuck. Geri Seminole., M.D. [facility] Division of Gastroenterology

## 2017-09-04 NOTE — Care Management Obs Status (Signed)
[place] NOTIFICATION   Patient Details  Name: Sheila Young MRN: [id number] Date of Birth: 05-20-1950   Medicare Observation Status Notification Given:  Yes    MahabirJuliann Pulse, RN 09/04/2017, 3:34 PM

## 2017-09-05 DIAGNOSIS — E876 Hypokalemia: Secondary | ICD-10-CM

## 2017-09-05 DIAGNOSIS — C159 Malignant neoplasm of esophagus, unspecified: Secondary | ICD-10-CM | POA: Diagnosis not present

## 2017-09-05 DIAGNOSIS — K921 Melena: Secondary | ICD-10-CM | POA: Diagnosis not present

## 2017-09-05 DIAGNOSIS — K922 Gastrointestinal hemorrhage, unspecified: Secondary | ICD-10-CM

## 2017-09-05 LAB — BASIC METABOLIC PANEL
ANION GAP: 8 (ref 5–15)
BUN: 12 mg/dL (ref 6–20)
CO2: 31 mmol/L (ref 22–32)
Calcium: 9 mg/dL (ref 8.9–10.3)
Chloride: 105 mmol/L (ref 101–111)
Creatinine, Ser: 0.93 mg/dL (ref 0.44–1.00)
GFR calc Af Amer: >60 mL/min (ref >60)
Glucose, Bld: 132 mg/dL — ABNORMAL HIGH (ref 65–99)
POTASSIUM: 3.2 mmol/L — AB (ref 3.5–5.1)
Sodium: 144 mmol/L (ref 135–145)

## 2017-09-05 LAB — CBC
HEMATOCRIT: 27.6 % — AB (ref 36.0–46.0)
HEMOGLOBIN: 9 g/dL — AB (ref 12.0–15.0)
MCH: 28.7 pg (ref 26.0–34.0)
MCHC: 32.6 g/dL (ref 30.0–36.0)
MCV: 87.9 fL (ref 78.0–100.0)
Platelets: 245 10*3/uL (ref 150–400)
RBC: 3.14 MIL/uL — ABNORMAL LOW (ref 3.87–5.11)
RDW: 15.6 % — ABNORMAL HIGH (ref 11.5–15.5)
WBC: 10.2 10*3/uL (ref 4.0–10.5)

## 2017-09-05 LAB — MAGNESIUM: Magnesium: 1.5 mg/dL — ABNORMAL LOW (ref 1.7–2.4)

## 2017-09-05 LAB — HIV ANTIBODY (ROUTINE TESTING W REFLEX): HIV SCREEN 4TH GENERATION: NONREACTIVE

## 2017-09-05 MED ORDER — MAGNESIUM SULFATE 2 GM/50ML IV SOLN
2.0000 g | Freq: Once | INTRAVENOUS | Status: AC
  Administered 2017-09-05: 2 g via INTRAVENOUS
  Filled 2017-09-05: qty 50

## 2017-09-05 NOTE — Evaluation (Signed)
Physical Therapy One Time Evaluation Patient Details Name: Sheila Young MRN: [id number] DOB: Feb 16, 1951 Today's Date: 09/05/2017   History of Present Illness  Pt is a 67 year old female with history of previous CVA, memory loss, seizure disorder, depression, hyperlipidemia, chronic kidney disease and squamous cell carcinoma of the esophagus and admitted for GI bleed  Clinical Impression  Patient evaluated by Physical Therapy with no further acute PT needs identified. All education has been completed and the patient has no further questions.  Pt currently presents at supervision level and ambulated good distance in hallway without assistive device.  Pt reports she lives with her sister. See below for any follow-up Physical Therapy or equipment needs. PT is signing off. Thank you for this referral.     Follow Up Recommendations No PT follow up    Equipment Recommendations  None recommended by PT    Recommendations for Other Services       Precautions / Restrictions Precautions Precautions: Fall      Mobility  Bed Mobility Overal bed mobility: Modified Independent                Transfers Overall transfer level: Needs assistance Equipment used: None Transfers: Sit to/from Stand Sit to Stand: Supervision         General transfer comment: supervision for safety  Ambulation/Gait Ambulation/Gait assistance: Supervision;Min guard Ambulation Distance (Feet): 400 Feet Assistive device: None Gait Pattern/deviations: Step-through pattern;Decreased stride length     General Gait Details: no unsteady gait or LOB observed, pt denies any symptoms  Stairs            Wheelchair Mobility    Modified Rankin (Stroke Patients Only)       Balance Overall balance assessment: No apparent balance deficits (not formally assessed)                                           Pertinent Vitals/Pain Pain Assessment: No/denies pain    Home Living  Family/patient expects to be discharged to:: Private residence Living Arrangements: Other relatives(lives with her sister)   Type of Home: House Home Access: Level entry     Home Layout: One level Home Equipment: None      Prior Function Level of Independence: Independent               Hand Dominance        Extremity/Trunk Assessment        Lower Extremity Assessment Lower Extremity Assessment: Overall WFL for tasks assessed       Communication   Communication: No difficulties  Cognition Arousal/Alertness: Awake/alert Behavior During Therapy: WFL for tasks assessed/performed Overall Cognitive Status: History of cognitive impairments - at baseline                                        General Comments      Exercises     Assessment/Plan    PT Assessment Patent does not need any further PT services  PT Problem List         PT Treatment Interventions      PT Goals (Current goals can be found in the Care Plan section)  Acute Rehab PT Goals PT Goal Formulation: All assessment and education complete, DC therapy    Frequency  Barriers to discharge        Co-evaluation               AM-PAC PT "6 Clicks" Daily Activity  Outcome Measure Difficulty turning over in bed (including adjusting bedclothes, sheets and blankets)?: None Difficulty moving from lying on back to sitting on the side of the bed? : None Difficulty sitting down on and standing up from a chair with arms (e.g., wheelchair, bedside commode, etc,.)?: None Help needed moving to and from a bed to chair (including a wheelchair)?: None Help needed walking in hospital room?: A Little Help needed climbing 3-5 steps with a railing? : A Little 6 Click Score: 22    End of Session Equipment Utilized During Treatment: Gait belt Activity Tolerance: Patient tolerated treatment well Patient left: in bed;with bed alarm set;with call bell/phone within reach Nurse  Communication: Mobility status PT Visit Diagnosis: Difficulty in walking, not elsewhere classified (R26.2)    Time: 2542-7062 PT Time Calculation (min) (ACUTE ONLY): 12 min   Charges:   PT Evaluation $PT Eval Low Complexity: 1 Low     PT G CodesCarmelia Bake, PT, DPT 09/05/2017 Pager: [phone]  York Ram E 09/05/2017, 12:34 PM

## 2017-09-05 NOTE — Progress Notes (Addendum)
Patient ID: Sheila Young, female   DOB: 27-Jan-1951, 67 y.o.   MRN: [id number]    Progress Note   Subjective    No complaints , no bleeding or BM's overnight HGB 9.0 this am - down from 10 .6 on 5/29       Objective   Vital signs in last 24 hours: Temp:  [98.4 F (36.9 C)-98.6 F (37 C)] 98.5 F (36.9 C) (05/31 0542) Pulse Rate:  [72-82] 72 (05/31 0542) Resp:  [14-18] 18 (05/31 0542) BP: (138-167)/(63-91) 146/81 (05/31 0542) SpO2:  [93 %-100 %] 99 % (05/31 0542) Last BM Date: 09/05/17 General:    AA female  female in NAD answers simple questions Heart:  Regular rate and rhythm; no murmurs Lungs: Respirations even and unlabored, lungs CTA bilaterally Abdomen:  Soft, nontender and nondistended. Normal bowel sounds. Extremities:  Without edema. Neurologic:  Alert and oriented,  grossly normal neurologically. Psych:  Cooperative. Normal mood and affect.  Intake/Output from previous day: 05/30 0701 - 05/31 0700 In: 1318.3 [I.V.:1318.3] Out: 1050 [Urine:1050] Intake/Output this shift: Total I/O In: -  Out: 200 [Urine:200]  Lab Results: Recent Labs    09/03/17 2009 09/04/17 0818 09/05/17 0534  WBC 13.2* 12.6* 10.2  HGB 9.5* 9.3* 9.0*  HCT 28.2* 27.9* 27.6*  PLT 239 243 245   BMET Recent Labs    09/03/17 1430 09/04/17 0927 09/05/17 0534  NA 143 141 144  K 3.1* 2.6* 3.2*  CL 106 102 105  CO2 27 28 31   GLUCOSE 119* 132* 132*  BUN 30* 18 12  CREATININE 1.21* 0.92 0.93  CALCIUM 9.4 8.6* 9.0   LFT Recent Labs    09/03/17 1430  PROT 7.3  ALBUMIN 3.7  AST 26  ALT 20  ALKPHOS 73  BILITOT 0.5   PT/INR No results for input(s): LABPROT, INR in the last 72 hours.  Studies/Results: Ct Abdomen Pelvis W Contrast  Result Date: 09/03/2017 CLINICAL DATA:  Abdominal pain, diverticulitis suspected. EXAM: CT ABDOMEN AND PELVIS WITH CONTRAST TECHNIQUE: Multidetector CT imaging of the abdomen and pelvis was performed using the standard protocol following bolus  administration of intravenous contrast. CONTRAST:  141mL ISOVUE-300 IOPAMIDOL (ISOVUE-300) INJECTION 61% COMPARISON:  PET-CT 12/15/2014 FINDINGS: Lower chest: Paramediastinal pleural thickening and lung reticulation likely from prior esophageal cancer treatment. Cardiomegaly. Atherosclerosis. No masslike finding. Hepatobiliary: Lobulated liver surface, suspect cirrhosis. Negative for mass.No evidence of biliary obstruction or stone. Pancreas: Unremarkable. Spleen: Unremarkable. Adrenals/Urinary Tract: Somewhat heterogeneous left adrenal mass measuring 3 cm, but essentially stable from 2016 and thus attributed to adenoma. Atrophy of the malrotated left kidney. 3 mm right renal stone. Left renal hilar calcifications appear vascular. 13 mm left renal cyst. Decompressed urinary bladder that is otherwise unremarkable. Stomach/Bowel: Extensive colonic diverticulosis, greatest along the proximal and sigmoid colon. No inflammatory changes or obstruction. No appendicitis. Scarring related to prior percutaneous gastrostomy. Incidental submucosal lipoma at the hepatic flexure, 16 mm in maximal span. Vascular/Lymphatic: Extensive atherosclerotic plaque of the aorta and visceral branches. Probable right SFA occlusion, certainty limited by contrast timing. No mass or adenopathy. Reproductive:2.7 cm fundal fibroid which is likely regressed from 2016. Other: No ascites or pneumoperitoneum. Musculoskeletal: No acute abnormalities. IMPRESSION: 1. No acute finding. 2. Liver lobulation concerning for cirrhosis. Please correlate for risk factors. 3. Colonic diverticulosis. 4. Aortic Atherosclerosis (ICD10-I70.0), that is extensive. Probable occlusion of the right SFA. Electronically Signed   By: Monte Fantasia M.D.   On: 09/03/2017 16:58   Dg Chest [facility]  Result Date: 09/04/2017 CLINICAL DATA:  Transient hypoxia.  Rectal bleeding. EXAM: PORTABLE CHEST 1 VIEW COMPARISON:  06/09/2017 FINDINGS: The cardiac silhouette is upper  limits of normal in size, unchanged. Aortic atherosclerosis is noted. Small right pleural effusion on the prior study has resolved. No airspace consolidation, edema, or pneumothorax is identified. No acute osseous abnormality is seen. IMPRESSION: No active disease. Electronically Signed   By: Logan Bores M.D.   On: 09/04/2017 08:44       Assessment / Plan:     #1 67 yo AA female admitted yesterday with bloody BM x 1 at home - hgb has drifted but no active bleeding since admit   She has previously documented Diverticulosis with Colonoscopy last done in 2014 [place]  Bleeding consistent with low grade diverticular bleed #2 Chronic antiplatelet RX -Plavix - ? When last dose  #3 hx squamous cell Ca esophagus 2015 - s/p chemo and radiation  #4 Dysphagia - has distal stricture on recent Ba Swallow - felt secondary to radiation - she has a gastroenterologist in [facility] - Dr Dorrene German  And is scheduled for upcoming EGD and dilation   #4 HX CVA / memory loss- cared for by sister at home #5 CKD  Plan; Advance diet , appears to have a self limited low grade lower GI bleed  Hold Plavix until sorted out when her EGD is scheduled for in [facility] We do not plan endoscopic evaluation here - she needs to see Her GI in [place] and discuss F/U colonoscopy  ( Dr  Dorrene German )  GI will sign off   Contact  Amy Pike, P.A.-C               [phone]  Active Problems:   Mechanical dysphagia   Esophageal cancer (HCC)   Hypokalemia   GI bleed    LOS: 0 days   Amy Esterwood  09/05/2017, 10:28 AM   GI ATTENDING  Interval history data reviewed. Patient seen and examined. Agree with interval progress note without additions or lesions. Will sign off  John N. Geri Seminole., M.D. [facility] Division of Gastroenterology

## 2017-09-05 NOTE — Progress Notes (Signed)
PROGRESS NOTE  Sheila Young VQQ:[id number] DOB: 06/17/1950 DOA: 09/03/2017 PCP: Beckie Salts, MD  HPI/Recap of past 21 hours: 67 year old female with medical history significant for CVA, dysphasia, esophageal cancer, hypokalemia presents to the ER with a 1 day history of bright red blood per rectum, patient has never had GI bleeding in the past.  Patient denies any abdominal pain, nausea/vomiting, melena, chest pain.  In the ED, hemoglobin noted to have gradually trended down, CT abdomen pelvis significant for diverticulosis.  Patient admitted for further management.  Today, patient denies any new complaints denies any abdominal pain, last bright red blood per rectum was around 7 PM yesterday as per RN.  No noted bleeding this morning.  GI on board.   Assessment/Plan: Active Problems:   Mechanical dysphagia   Esophageal cancer (HCC)   Hypokalemia   GI bleed  Lower GI bleed Etiology likely diverticula bleed No significant bleeding since this morning CT abdomen pelvis showed colonic diverticulosis, liver lobulation concerning for cirrhosis Last colonoscopy done in [place] showed diverticulosis GI on board, spoke to PA, okay to monitor overnight, no further recommendations Continue to hold Plavix Daily CBC, unless significant bleeding noted then more frequent  Acute blood loss anemia Likely due to above Slow downward trend of hemoglobin since March 2019, baseline around 10-12 Daily CBC, transfuse if hemoglobin is less than 8  Dysphasia likely due to history of esophageal cancer Patient follows with GI at [facility], recently had EGD with esophageal dilatation about 3 weeks ago, confirmed with sister and telephone conversation in care everywhere also confirmed that patient has had a dilatation and EGD Currently no problems swallowing  History of CVA Continue statins, continue to hold Plavix  Hypokalemia/hypomagnesemia Continue to replace as needed  Seizure  disorder Stable, continue Keppra  Esophageal cancer Status post chemo/radiation Not on any active treatment  Depression Continue Prozac    Code Status: Full  Family Communication: Spoke with sister Enid Derry  Disposition Plan: Monitor CBC, if no significant drop, may discharge on 09/06/2017   Consultants:  GI  Procedures:  None  Antimicrobials:  None  DVT prophylaxis: SCDs   Objective: Vitals:   09/04/17 1333 09/04/17 2015 09/05/17 0542 09/05/17 1252  BP: (!) 145/79 140/63 (!) 146/81 131/66  Pulse: 80 75 72 68  Resp: 14 18 18 17   Temp: 98.4 F (36.9 C) 98.6 F (37 C) 98.5 F (36.9 C) 97.9 F (36.6 C)  TempSrc: Oral Oral  Oral  SpO2: 100% 100% 99% 100%  Weight:      Height:        Intake/Output Summary (Last 24 hours) at 09/05/2017 1523 Last data filed at 09/05/2017 1508 Gross per 24 hour  Intake 1998.33 ml  Output 1550 ml  Net 448.33 ml   Filed Weights   09/03/17 1153  Weight: 77.9 kg (171 lb 11.8 oz)    Exam:   General: NAD  Cardiovascular: S1, S2 present  Respiratory: CTA B  Abdomen: Soft, nontender, nondistended, bowel sounds present  Musculoskeletal: No pedal edema bilaterally  Skin: Normal  Psychiatry: Normal mood   Data Reviewed: CBC: Recent Labs  Lab 09/03/17 1430 09/03/17 2009 09/04/17 0818 09/05/17 0534  WBC 16.3* 13.2* 12.6* 10.2  NEUTROABS 12.0*  --   --   --   HGB 10.6* 9.5* 9.3* 9.0*  HCT 31.4* 28.2* 27.9* 27.6*  MCV 88.0 87.6 88.3 87.9  PLT 229 239 243 433   Basic Metabolic Panel: Recent Labs  Lab 09/03/17 1430 09/04/17 0927  09/05/17 0534  NA 143 141 144  K 3.1* 2.6* 3.2*  CL 106 102 105  CO2 27 28 31   GLUCOSE 119* 132* 132*  BUN 30* 18 12  CREATININE 1.21* 0.92 0.93  CALCIUM 9.4 8.6* 9.0  MG  --   --  1.5*   GFR: Estimated Creatinine Clearance: 62.7 mL/min (by C-G formula based on SCr of 0.93 mg/dL). Liver Function Tests: Recent Labs  Lab 09/03/17 1430  AST 26  ALT 20  ALKPHOS 73   BILITOT 0.5  PROT 7.3  ALBUMIN 3.7   No results for input(s): LIPASE, AMYLASE in the last 168 hours. No results for input(s): AMMONIA in the last 168 hours. Coagulation Profile: No results for input(s): INR, PROTIME in the last 168 hours. Cardiac Enzymes: No results for input(s): CKTOTAL, CKMB, CKMBINDEX, TROPONINI in the last 168 hours. BNP (last 3 results) No results for input(s): PROBNP in the last 8760 hours. HbA1C: No results for input(s): HGBA1C in the last 72 hours. CBG: Recent Labs  Lab 09/04/17 0807  GLUCAP 130*   Lipid Profile: No results for input(s): CHOL, HDL, LDLCALC, TRIG, CHOLHDL, LDLDIRECT in the last 72 hours. Thyroid Function Tests: No results for input(s): TSH, T4TOTAL, FREET4, T3FREE, THYROIDAB in the last 72 hours. Anemia Panel: No results for input(s): VITAMINB12, FOLATE, FERRITIN, TIBC, IRON, RETICCTPCT in the last 72 hours. Urine analysis:    Component Value Date/Time   COLORURINE YELLOW 09/03/2017 1441   APPEARANCEUR CLOUDY (A) 09/03/2017 1441   LABSPEC 1.010 09/03/2017 1441   PHURINE 6.5 09/03/2017 1441   GLUCOSEU NEGATIVE 09/03/2017 1441   HGBUR SMALL (A) 09/03/2017 1441   BILIRUBINUR NEGATIVE 09/03/2017 1441   KETONESUR NEGATIVE 09/03/2017 1441   PROTEINUR NEGATIVE 09/03/2017 1441   NITRITE NEGATIVE 09/03/2017 1441   LEUKOCYTESUR NEGATIVE 09/03/2017 1441   Sepsis Labs: @LABRCNTIP (procalcitonin:4,lacticidven:4)  )No results found for this or any previous visit (from the past 240 hour(s)).    Studies: No results found.  Scheduled Meds: . amLODipine  5 mg Oral Daily  . atorvastatin  40 mg Oral q morning - 10a  . famotidine  20 mg Oral BID  . FLUoxetine  10 mg Oral Daily  . folic acid  1 mg Oral Daily  . levETIRAcetam  500 mg Oral BID  . losartan  50 mg Oral Daily  . magnesium gluconate  250 mg Oral Daily  . potassium chloride  40 mEq Oral Daily    Continuous Infusions: . dextrose 5 % and 0.45% NaCl 50 mL/hr at 09/05/17 1251      LOS: 0 days     Alma Friendly, MD [facility]  If 7PM-7AM, please contact night-coverage [email or web address] Password [facility] 09/05/2017, 3:23 PM

## 2017-09-06 DIAGNOSIS — D62 Acute posthemorrhagic anemia: Secondary | ICD-10-CM

## 2017-09-06 DIAGNOSIS — Z7902 Long term (current) use of antithrombotics/antiplatelets: Secondary | ICD-10-CM

## 2017-09-06 DIAGNOSIS — K922 Gastrointestinal hemorrhage, unspecified: Secondary | ICD-10-CM | POA: Diagnosis not present

## 2017-09-06 DIAGNOSIS — K5731 Diverticulosis of large intestine without perforation or abscess with bleeding: Secondary | ICD-10-CM | POA: Diagnosis not present

## 2017-09-06 LAB — CBC WITH DIFFERENTIAL/PLATELET
BASOS PCT: 1 %
Basophils Absolute: 0.1 10*3/uL (ref 0.0–0.1)
EOS ABS: 0.5 10*3/uL (ref 0.0–0.7)
EOS PCT: 4 %
HCT: 29.9 % — ABNORMAL LOW (ref 36.0–46.0)
HEMOGLOBIN: 9.5 g/dL — AB (ref 12.0–15.0)
Lymphocytes Relative: 19 %
Lymphs Abs: 2.2 10*3/uL (ref 0.7–4.0)
MCH: 28.4 pg (ref 26.0–34.0)
MCHC: 31.8 g/dL (ref 30.0–36.0)
MCV: 89.3 fL (ref 78.0–100.0)
Monocytes Absolute: 1 10*3/uL (ref 0.1–1.0)
Monocytes Relative: 8 %
NEUTROS PCT: 68 %
Neutro Abs: 8 10*3/uL — ABNORMAL HIGH (ref 1.7–7.7)
PLATELETS: 294 10*3/uL (ref 150–400)
RBC: 3.35 MIL/uL — AB (ref 3.87–5.11)
RDW: 15.5 % (ref 11.5–15.5)
WBC: 11.7 10*3/uL — ABNORMAL HIGH (ref 4.0–10.5)

## 2017-09-06 LAB — MAGNESIUM: MAGNESIUM: 2 mg/dL (ref 1.7–2.4)

## 2017-09-06 LAB — BASIC METABOLIC PANEL
Anion gap: 8 (ref 5–15)
BUN: 15 mg/dL (ref 6–20)
CHLORIDE: 105 mmol/L (ref 101–111)
CO2: 31 mmol/L (ref 22–32)
CREATININE: 1.19 mg/dL — AB (ref 0.44–1.00)
Calcium: 9.3 mg/dL (ref 8.9–10.3)
GFR, EST AFRICAN AMERICAN: 54 mL/min — AB (ref >60)
GFR, EST NON AFRICAN AMERICAN: 47 mL/min — AB (ref >60)
Glucose, Bld: 109 mg/dL — ABNORMAL HIGH (ref 65–99)
POTASSIUM: 3.3 mmol/L — AB (ref 3.5–5.1)
SODIUM: 144 mmol/L (ref 135–145)

## 2017-09-06 NOTE — Progress Notes (Addendum)
Patient ID: Sheila Young, female   DOB: 09-27-1950, 67 y.o.   MRN: [id number]      Progress Note   Subjective   Asked to continue to follow pt  - Spoke with pt's nurse  who confirms no stool or bleeding today, small amt of red blood around 7 last night .  HGB today is 9.5 improved Day # 3 off Plavix   Objective   Vital signs in last 24 hours: Temp:  [98.4 F (36.9 C)] 98.4 F (36.9 C) (06/01 0537) Pulse Rate:  [71-76] 76 (06/01 0537) Resp:  [20] 20 (06/01 0537) BP: (102-147)/(65-75) 102/65 (06/01 0925) SpO2:  [99 %-100 %] 100 % (06/01 0537) Last BM Date: 09/05/17 General:    AA  female in NAD  Intake/Output from previous day: 05/31 0701 - 06/01 0700 In: 680 [I.V.:680] Out: 500 [Urine:500] Intake/Output this shift: Total I/O In: 240 [P.O.:240] Out: -   Lab Results: Recent Labs    09/04/17 0818 09/05/17 0534 09/06/17 0505  WBC 12.6* 10.2 11.7*  HGB 9.3* 9.0* 9.5*  HCT 27.9* 27.6* 29.9*  PLT 243 245 294   BMET Recent Labs    09/04/17 0927 09/05/17 0534 09/06/17 0505  NA 141 144 144  K 2.6* 3.2* 3.3*  CL 102 105 105  CO2 28 31 31   GLUCOSE 132* 132* 109*  BUN 18 12 15   CREATININE 0.92 0.93 1.19*  CALCIUM 8.6* 9.0 9.3   LFT Recent Labs    09/03/17 1430  PROT 7.3  ALBUMIN 3.7  AST 26  ALT 20  ALKPHOS 73  BILITOT 0.5   PT/INR No results for input(s): LABPROT, INR in the last 72 hours.      Assessment / Plan:     #1 67 yo AA female s/p CVA on chronic Plavix admitted 5/29 with rectal bleeding x one episode . Hgb  has drifted  About 3 grams but overall stable past 48hours  Off Plavix since admit with expected 5 day washout CT on admit showed extensive colonic  diverticulosis greatest along the proximal and sigmoid colon.  Previous Colonoscopy 2014 - [place] - diverticulosis . Does not need repeat colonoscopy  Current bleeding is very consistent with diverticular bleeding complicated by Plavix use  #2 Hx squamous cell Ca of esophagus  - recent Barium swallow showed distal esophageal stricture - she apparently had EGD with dilation in May with Dr Dorrene German   #3 memory loss #4 lobulated liver on CT ? Early cirrhosis  Plan; Discussed with Dr Henrene Pastor - continue observation one more day - if hgb stable tomorrow ,can be discharged  Home  To follow up with PCP and established Gastroenterologist   Given prior history of stroke would resume Plavix after her stools have returned to normal color. This should be within the next few days if there is no further bleeding.  Contact  Amy Morrice, P.A.-C               [phone]   GI ATTENDING  Agree with recommendations as above, including my highlights. No evidence for GI bleeding. If she remains stable okay for discharge tomorrow back to her primary providers including her primary GI doctors.  Docia Chuck. Geri Seminole., M.D. [facility] Division of Gastroenterology

## 2017-09-06 NOTE — Progress Notes (Signed)
PROGRESS NOTE  Sheila Young CBJ:[id number] DOB: 1951/01/09 DOA: 09/03/2017 PCP: Beckie Salts, MD  HPI/Recap of past 54 hours: 67 year old female with medical history significant for CVA, dysphasia, esophageal cancer, hypokalemia presents to the ER with a 1 day history of bright red blood per rectum, patient has never had GI bleeding in the past.  Patient denies any abdominal pain, nausea/vomiting, melena, chest pain.  In the ED, hemoglobin noted to have gradually trended down, CT abdomen pelvis significant for diverticulosis.  Patient admitted for further management.  Today, patient denies any new complaints, denies any abdominal pain. As per RN, small amt of blood in stool last night. No seen today.   Assessment/Plan: Active Problems:   Mechanical dysphagia   Esophageal cancer (HCC)   Hypokalemia   GI bleed  Lower GI bleed Etiology likely diverticula bleed No significant bleeding since this morning CT abdomen pelvis showed colonic diverticulosis, liver lobulation concerning for cirrhosis Last colonoscopy done in [place] showed diverticulosis GI on board, okay to monitor overnight, no further recommendations Continue to hold Plavix Daily CBC, unless significant bleeding noted then more frequent  Acute blood loss anemia Likely due to above Slow downward trend of hemoglobin since March 2019, baseline around 10-12, currently no significant drop noted Daily CBC, transfuse if hemoglobin is less than 8  Dysphasia likely due to history of esophageal cancer Patient follows with GI at [facility], recently had EGD with esophageal dilatation about 3 weeks ago, confirmed with sister and telephone conversation in care everywhere also confirmed that patient has had a dilatation and EGD Currently no problems swallowing  History of CVA Continue statins, continue to hold Plavix  Hypokalemia/hypomagnesemia Continue to replace as needed  HTN Stable Contine losartan, d/c  amlodipine  Seizure disorder Stable, continue Keppra  Esophageal cancer Status post chemo/radiation Not on any active treatment  Depression Continue Prozac    Code Status: Full  Family Communication: None at bedside  Disposition Plan: Monitor CBC, if no significant drop, may discharge on 09/07/2017   Consultants:  GI  Procedures:  None  Antimicrobials:  None  DVT prophylaxis: SCDs   Objective: Vitals:   09/05/17 2022 09/06/17 0537 09/06/17 0925 09/06/17 1350  BP: (!) 147/71 128/75 102/65 118/69  Pulse: 71 76  79  Resp: 20 20  (!) 22  Temp: 98.4 F (36.9 C) 98.4 F (36.9 C)  98.6 F (37 C)  TempSrc: Oral Oral  Oral  SpO2: 99% 100%  99%  Weight:      Height:        Intake/Output Summary (Last 24 hours) at 09/06/2017 1804 Last data filed at 09/06/2017 1240 Gross per 24 hour  Intake 240 ml  Output -  Net 240 ml   Filed Weights   09/03/17 1153  Weight: 77.9 kg (171 lb 11.8 oz)    Exam:   General: NAD  Cardiovascular: S1, S2 present  Respiratory: CTA B  Abdomen: Soft, nontender, nondistended, bowel sounds present  Musculoskeletal: No pedal edema bilaterally  Skin: Normal  Psychiatry: Normal mood   Data Reviewed: CBC: Recent Labs  Lab 09/03/17 1430 09/03/17 2009 09/04/17 0818 09/05/17 0534 09/06/17 0505  WBC 16.3* 13.2* 12.6* 10.2 11.7*  NEUTROABS 12.0*  --   --   --  8.0*  HGB 10.6* 9.5* 9.3* 9.0* 9.5*  HCT 31.4* 28.2* 27.9* 27.6* 29.9*  MCV 88.0 87.6 88.3 87.9 89.3  PLT 229 239 243 245 160   Basic Metabolic Panel: Recent Labs  Lab 09/03/17 1430  09/04/17 0927 09/05/17 0534 09/06/17 0505  NA 143 141 144 144  K 3.1* 2.6* 3.2* 3.3*  CL 106 102 105 105  CO2 27 28 31 31   GLUCOSE 119* 132* 132* 109*  BUN 30* 18 12 15   CREATININE 1.21* 0.92 0.93 1.19*  CALCIUM 9.4 8.6* 9.0 9.3  MG  --   --  1.5* 2.0   GFR: Estimated Creatinine Clearance: 49 mL/min (A) (by C-G formula based on SCr of 1.19 mg/dL (H)). Liver Function  Tests: Recent Labs  Lab 09/03/17 1430  AST 26  ALT 20  ALKPHOS 73  BILITOT 0.5  PROT 7.3  ALBUMIN 3.7   No results for input(s): LIPASE, AMYLASE in the last 168 hours. No results for input(s): AMMONIA in the last 168 hours. Coagulation Profile: No results for input(s): INR, PROTIME in the last 168 hours. Cardiac Enzymes: No results for input(s): CKTOTAL, CKMB, CKMBINDEX, TROPONINI in the last 168 hours. BNP (last 3 results) No results for input(s): PROBNP in the last 8760 hours. HbA1C: No results for input(s): HGBA1C in the last 72 hours. CBG: Recent Labs  Lab 09/04/17 0807  GLUCAP 130*   Lipid Profile: No results for input(s): CHOL, HDL, LDLCALC, TRIG, CHOLHDL, LDLDIRECT in the last 72 hours. Thyroid Function Tests: No results for input(s): TSH, T4TOTAL, FREET4, T3FREE, THYROIDAB in the last 72 hours. Anemia Panel: No results for input(s): VITAMINB12, FOLATE, FERRITIN, TIBC, IRON, RETICCTPCT in the last 72 hours. Urine analysis:    Component Value Date/Time   COLORURINE YELLOW 09/03/2017 1441   APPEARANCEUR CLOUDY (A) 09/03/2017 1441   LABSPEC 1.010 09/03/2017 1441   PHURINE 6.5 09/03/2017 1441   GLUCOSEU NEGATIVE 09/03/2017 1441   HGBUR SMALL (A) 09/03/2017 1441   BILIRUBINUR NEGATIVE 09/03/2017 1441   KETONESUR NEGATIVE 09/03/2017 1441   PROTEINUR NEGATIVE 09/03/2017 1441   NITRITE NEGATIVE 09/03/2017 1441   LEUKOCYTESUR NEGATIVE 09/03/2017 1441   Sepsis Labs: @LABRCNTIP (procalcitonin:4,lacticidven:4)  )No results found for this or any previous visit (from the past 240 hour(s)).    Studies: No results found.  Scheduled Meds: . atorvastatin  40 mg Oral q morning - 10a  . famotidine  20 mg Oral BID  . FLUoxetine  10 mg Oral Daily  . folic acid  1 mg Oral Daily  . levETIRAcetam  500 mg Oral BID  . losartan  50 mg Oral Daily  . magnesium gluconate  250 mg Oral Daily  . potassium chloride  40 mEq Oral Daily    Continuous Infusions:    LOS: 0 days      Alma Friendly, MD [facility]  If 7PM-7AM, please contact night-coverage [email or web address] Password [facility] 09/06/2017, 6:04 PM

## 2017-09-07 DIAGNOSIS — R1319 Other dysphagia: Secondary | ICD-10-CM | POA: Diagnosis not present

## 2017-09-07 DIAGNOSIS — D62 Acute posthemorrhagic anemia: Secondary | ICD-10-CM

## 2017-09-07 DIAGNOSIS — E876 Hypokalemia: Secondary | ICD-10-CM | POA: Diagnosis not present

## 2017-09-07 DIAGNOSIS — Z7902 Long term (current) use of antithrombotics/antiplatelets: Secondary | ICD-10-CM

## 2017-09-07 DIAGNOSIS — C159 Malignant neoplasm of esophagus, unspecified: Secondary | ICD-10-CM | POA: Diagnosis not present

## 2017-09-07 DIAGNOSIS — K625 Hemorrhage of anus and rectum: Secondary | ICD-10-CM

## 2017-09-07 DIAGNOSIS — K5731 Diverticulosis of large intestine without perforation or abscess with bleeding: Secondary | ICD-10-CM | POA: Diagnosis not present

## 2017-09-07 DIAGNOSIS — K5791 Diverticulosis of intestine, part unspecified, without perforation or abscess with bleeding: Secondary | ICD-10-CM

## 2017-09-07 DIAGNOSIS — K922 Gastrointestinal hemorrhage, unspecified: Secondary | ICD-10-CM | POA: Diagnosis not present

## 2017-09-07 LAB — BASIC METABOLIC PANEL
Anion gap: 11 (ref 5–15)
BUN: 22 mg/dL — AB (ref 6–20)
CO2: 26 mmol/L (ref 22–32)
CREATININE: 1.42 mg/dL — AB (ref 0.44–1.00)
Calcium: 9 mg/dL (ref 8.9–10.3)
Chloride: 104 mmol/L (ref 101–111)
GFR calc Af Amer: 44 mL/min — ABNORMAL LOW (ref >60)
GFR, EST NON AFRICAN AMERICAN: 38 mL/min — AB (ref >60)
GLUCOSE: 114 mg/dL — AB (ref 65–99)
Potassium: 3.1 mmol/L — ABNORMAL LOW (ref 3.5–5.1)
SODIUM: 141 mmol/L (ref 135–145)

## 2017-09-07 LAB — CBC WITH DIFFERENTIAL/PLATELET
Basophils Absolute: 0.1 10*3/uL (ref 0.0–0.1)
Basophils Relative: 1 %
EOS ABS: 0.4 10*3/uL (ref 0.0–0.7)
EOS PCT: 4 %
HCT: 27.5 % — ABNORMAL LOW (ref 36.0–46.0)
Hemoglobin: 9 g/dL — ABNORMAL LOW (ref 12.0–15.0)
LYMPHS ABS: 2.5 10*3/uL (ref 0.7–4.0)
LYMPHS PCT: 23 %
MCH: 28.9 pg (ref 26.0–34.0)
MCHC: 32.7 g/dL (ref 30.0–36.0)
MCV: 88.4 fL (ref 78.0–100.0)
MONO ABS: 0.8 10*3/uL (ref 0.1–1.0)
MONOS PCT: 8 %
NEUTROS ABS: 6.9 10*3/uL (ref 1.7–7.7)
NEUTROS PCT: 64 %
PLATELETS: 280 10*3/uL (ref 150–400)
RBC: 3.11 MIL/uL — ABNORMAL LOW (ref 3.87–5.11)
RDW: 15.6 % — AB (ref 11.5–15.5)
WBC: 10.6 10*3/uL — ABNORMAL HIGH (ref 4.0–10.5)

## 2017-09-07 MED ORDER — SODIUM CHLORIDE 0.9 % IV SOLN
INTRAVENOUS | Status: DC
  Administered 2017-09-07: 08:00:00 via INTRAVENOUS

## 2017-09-07 MED ORDER — AMLODIPINE BESYLATE 10 MG PO TABS
10.0000 mg | ORAL_TABLET | Freq: Every day | ORAL | Status: DC
  Administered 2017-09-07: 10 mg via ORAL
  Filled 2017-09-07: qty 1

## 2017-09-07 NOTE — Progress Notes (Addendum)
Patient ID: Sheila Young, female   DOB: 06-17-1950, 67 y.o.   MRN: [id number]    Progress Note   Subjective  Up in chair, feeling fine Nurse reports pt had a BM last night - no blood - no BM since  HGB 9.0 this am   Objective   Vital signs in last 24 hours: Temp:  [98.2 F (36.8 C)-98.7 F (37.1 C)] 98.2 F (36.8 C) (06/02 0426) Pulse Rate:  [66-93] 93 (06/02 0944) Resp:  [18-22] 18 (06/02 0426) BP: (118-144)/(62-77) 144/68 (06/02 0944) SpO2:  [98 %-99 %] 98 % (06/02 0426) Last BM Date: 09/06/17 General:    AA female in NAD Heart:  Regular rate and rhythm; no murmurs Lungs: Respirations even and unlabored, lungs CTA bilaterally Abdomen:  Soft, nontender and nondistended. Normal bowel sounds. Extremities:  Without edema. Neurologic:  Alert and oriented,   Psych:  Cooperative. Normal mood and affect.  Intake/Output from previous day: 06/01 0701 - 06/02 0700 In: 600 [P.O.:600] Out: -  Intake/Output this shift: No intake/output data recorded.  Lab Results: Recent Labs    09/05/17 0534 09/06/17 0505 09/07/17 0442  WBC 10.2 11.7* 10.6*  HGB 9.0* 9.5* 9.0*  HCT 27.6* 29.9* 27.5*  PLT 245 294 280   BMET Recent Labs    09/05/17 0534 09/06/17 0505 09/07/17 0442  NA 144 144 141  K 3.2* 3.3* 3.1*  CL 105 105 104  CO2 31 31 26   GLUCOSE 132* 109* 114*  BUN 12 15 22*  CREATININE 0.93 1.19* 1.42*  CALCIUM 9.0 9.3 9.0   LFT No results for input(s): PROT, ALBUMIN, AST, ALT, ALKPHOS, BILITOT, BILIDIR, IBILI in the last 72 hours. PT/INR No results for input(s): LABPROT, INR in the last 72 hours.  Studies/Results: No results found.     Assessment / Plan:     #1 67 yo AA female with resolved acute lower GI bleed  Consistent  with Diverticular bleed in setting of Plavix  HGB overall stable past 24 hrs + No transfusion requirement  #2 anemia secondary to blood loss  #3 hx CVA /memory loss #4 hx squamous cell CA esophagus- s/p radiation /chemo - recent EGD and  dilation of stricture -[place]/ Dr Dorrene German  Plan;  OK to discharge from GI standpoint   would leave off plavix for a few days then resume She should have repeat hgb next week per PCP   Follow up appt with GI /Dr Rhoton/ [place]  to discuss follow up Colonoscopy   Contact  Amy Esterwood, P.A.-C               [phone]   GI ATTENDING  Interval history and data reviewed. Agree with interval progress note as outlined above. Patient continues to be stable without GI bleeding. Okay for discharge from GI standpoint. She should resume her care with her regular physicians. Thank you.  Docia Chuck. Geri Seminole., M.D. [facility] Division of Gastroenterology.

## 2017-09-07 NOTE — Discharge Summary (Signed)
Discharge Summary  Sheila Young FWY:[id number] DOB: [birth year]  PCP: Beckie Salts, MD  Admit date: 09/03/2017 Discharge date: 09/07/2017  Time spent: 40 mins  Recommendations for Outpatient Follow-up:  1. PCP 2. GI  Discharge Diagnoses:  Active Hospital Problems   Diagnosis Date Noted  . GI bleed 09/03/2017  . Hypokalemia 05/20/2014  . Mechanical dysphagia 04/07/2014  . Esophageal cancer ([place]) 04/07/2014    Resolved Hospital Problems  No resolved problems to display.    Discharge Condition: Stable   Diet recommendation: Heart healthy   Vitals:   09/07/17 0426 09/07/17 0944  BP: (!) 143/62 (!) 144/68  Pulse: 66 93  Resp: 18   Temp: 98.2 F (36.8 C)   SpO2: 98%     History of present illness:  67 year old female with medical history significant for CVA, dysphasia, esophageal cancer, hypokalemia presents to the ER with a 1 day history of bright red blood per rectum, patient has never had GI bleeding in the past.  Patient denies any abdominal pain, nausea/vomiting, melena, chest pain.  In the ED, hemoglobin noted to have gradually trended down, CT abdomen pelvis significant for diverticulosis.  Patient admitted for further management.  Today, patient denies any new complaints, had a BM without any obvious blood in stool. Denies any abdominal pain. GI on board, ok to be discharged to follow up with GI in [place] and close follow with PCP  Hospital Course:  Active Problems:   Mechanical dysphagia   Esophageal cancer (HCC)   Hypokalemia   GI bleed  Lower GI bleed Etiology likely diverticula bleed No significant bleeding noted for the past 24H CT abdomen pelvis showed colonic diverticulosis, liver lobulation concerning for cirrhosis Last colonoscopy done in [place] showed diverticulosis GI on board, hold plavix, follow up with GI in [place] to repeat imaging for possible cirrhosis and colonoscopy Continue to hold Plavix PCP to follow up with repeat  labs  Acute blood loss anemia Likely due to above Slow downward trend of hemoglobin since March 2019, baseline around 10-12 PCP to follow up with repeat labs  AKI on CKD stage 2 Baseline Cr fluctuates, today bumped to 1.4 S/P 1L of IVF PCP to follow up with repeat labs   Dysphasia likely due to history of esophageal cancer Asymptomatic Patient follows with GI at [facility], recently had EGD with esophageal dilatation about 3 weeks ago prior to presentation  History of CVA Continue statins, continue to hold Plavix  Hypokalemia/hypomagnesemia Continue to replace as needed  Seizure disorder Stable, continue Keppra  Esophageal cancer Status post chemo/radiation Not on any active treatment  Depression Continue Prozac     Procedures:  None  Consultations:  GI  Discharge Exam: BP (!) 144/68 (BP Location: Right Arm)   Pulse 93   Temp 98.2 F (36.8 C) (Oral)   Resp 18   Ht 5\' 6"  (1.676 m)   Wt 77.9 kg (171 lb 11.8 oz)   SpO2 98%   BMI 27.72 kg/m   General: NAD Cardiovascular: S1, S2 present Respiratory: CTAB  Discharge Instructions You were cared for by a hospitalist during your hospital stay. If you have any questions about your discharge medications or the care you received while you were in the hospital after you are discharged, you can call the unit and asked to speak with the hospitalist on call if the hospitalist that took care of you is not available. Once you are discharged, your primary care physician will handle any further medical issues.  Please note that NO REFILLS for any discharge medications will be authorized once you are discharged, as it is imperative that you return to your primary care physician (or establish a relationship with a primary care physician if you do not have one) for your aftercare needs so that they can reassess your need for medications and monitor your lab values.  Discharge Instructions    Diet - low sodium heart  healthy   Complete by:  As directed    Increase activity slowly   Complete by:  As directed      Allergies as of 09/07/2017   No Known Allergies     Medication List    STOP taking these medications   clopidogrel 75 MG tablet Commonly known as:  PLAVIX   potassium chloride SA 20 MEQ tablet Commonly known as:  K-DUR,KLOR-CON     TAKE these medications   amLODipine 10 MG tablet Commonly known as:  NORVASC Take 0.5 tablets (5 mg total) by mouth daily. What changed:  how much to take   atorvastatin 40 MG tablet Commonly known as:  LIPITOR Take 40 mg by mouth every morning.   famotidine 20 MG tablet Commonly known as:  PEPCID Take 1 tablet (20 mg total) by mouth 2 (two) times daily.   fexofenadine 180 MG tablet Commonly known as:  ALLEGRA Take 180 mg by mouth as needed for allergies.   FLUoxetine 10 MG capsule Commonly known as:  PROZAC Take 1 capsule (10 mg total) by mouth daily.   folic acid 1 MG tablet Commonly known as:  FOLVITE TAKE ONE TABLET BY MOUTH ONCE DAILY. (ROUND YELLOW TAB WITH V 31/62)   levETIRAcetam 500 MG tablet Commonly known as:  KEPPRA Take 500 mg by mouth 2 (two) times daily.   losartan 50 MG tablet Commonly known as:  COZAAR Take 50 mg by mouth daily.   magnesium gluconate 500 MG tablet Commonly known as:  MAGONATE Take 1 tablet (500 mg total) by mouth daily. 250 mg daily What changed:    how much to take  additional instructions   metoCLOPramide 10 MG tablet Commonly known as:  REGLAN Take 1 tablet (10 mg total) by mouth 3 (three) times daily before meals.   pantoprazole 40 MG tablet Commonly known as:  PROTONIX Take 1 tablet (40 mg total) by mouth daily.   potassium chloride 20 MEQ packet Commonly known as:  KLOR-CON Take 40 mEq by mouth 2 (two) times daily. Take 2 packets by mouth 2 times daily for the next 3 days.  Then go back to your regular dose of 2 packets once daily.      No Known Allergies Follow-up Information     Schedule an appointment as soon as possible for a visit  with Gastroenterology, [place].   Specialty:  Gastroenterology Contact information: [address], [place] [address] [phone]        Beckie Salts, MD. Schedule an appointment as soon as possible for a visit in 1 week(s).   Specialty:  Internal Medicine Contact information: [address] [facility] [address] [phone]            The results of significant diagnostics from this hospitalization (including imaging, microbiology, ancillary and laboratory) are listed below for reference.    Significant Diagnostic Studies: Ct Abdomen Pelvis W Contrast  Result Date: 09/03/2017 CLINICAL DATA:  Abdominal pain, diverticulitis suspected. EXAM: CT ABDOMEN AND PELVIS WITH CONTRAST  TECHNIQUE: Multidetector CT imaging of the abdomen and pelvis was performed using the standard protocol following bolus administration of intravenous contrast. CONTRAST:  145mL ISOVUE-300 IOPAMIDOL (ISOVUE-300) INJECTION 61% COMPARISON:  PET-CT 12/15/2014 FINDINGS: Lower chest: Paramediastinal pleural thickening and lung reticulation likely from prior esophageal cancer treatment. Cardiomegaly. Atherosclerosis. No masslike finding. Hepatobiliary: Lobulated liver surface, suspect cirrhosis. Negative for mass.No evidence of biliary obstruction or stone. Pancreas: Unremarkable. Spleen: Unremarkable. Adrenals/Urinary Tract: Somewhat heterogeneous left adrenal mass measuring 3 cm, but essentially stable from 2016 and thus attributed to adenoma. Atrophy of the malrotated left kidney. 3 mm right renal stone. Left renal hilar calcifications appear vascular. 13 mm left renal cyst. Decompressed urinary bladder that is otherwise unremarkable. Stomach/Bowel: Extensive colonic diverticulosis, greatest along the proximal and sigmoid colon. No inflammatory changes or obstruction. No appendicitis. Scarring related to  prior percutaneous gastrostomy. Incidental submucosal lipoma at the hepatic flexure, 16 mm in maximal span. Vascular/Lymphatic: Extensive atherosclerotic plaque of the aorta and visceral branches. Probable right SFA occlusion, certainty limited by contrast timing. No mass or adenopathy. Reproductive:2.7 cm fundal fibroid which is likely regressed from 2016. Other: No ascites or pneumoperitoneum. Musculoskeletal: No acute abnormalities. IMPRESSION: 1. No acute finding. 2. Liver lobulation concerning for cirrhosis. Please correlate for risk factors. 3. Colonic diverticulosis. 4. Aortic Atherosclerosis (ICD10-I70.0), that is extensive. Probable occlusion of the right SFA. Electronically Signed   By: Monte Fantasia M.D.   On: 09/03/2017 16:58   Dg Chest Port 1 View  Result Date: 09/04/2017 CLINICAL DATA:  Transient hypoxia.  Rectal bleeding. EXAM: PORTABLE CHEST 1 VIEW COMPARISON:  06/09/2017 FINDINGS: The cardiac silhouette is upper limits of normal in size, unchanged. Aortic atherosclerosis is noted. Small right pleural effusion on the prior study has resolved. No airspace consolidation, edema, or pneumothorax is identified. No acute osseous abnormality is seen. IMPRESSION: No active disease. Electronically Signed   By: Logan Bores M.D.   On: 09/04/2017 08:44    Microbiology: No results found for this or any previous visit (from the past 240 hour(s)).   Labs: Basic Metabolic Panel: Recent Labs  Lab 09/03/17 1430 09/04/17 0927 09/05/17 0534 09/06/17 0505 09/07/17 0442  NA 143 141 144 144 141  K 3.1* 2.6* 3.2* 3.3* 3.1*  CL 106 102 105 105 104  CO2 27 28 31 31 26   GLUCOSE 119* 132* 132* 109* 114*  BUN 30* 18 12 15  22*  CREATININE 1.21* 0.92 0.93 1.19* 1.42*  CALCIUM 9.4 8.6* 9.0 9.3 9.0  MG  --   --  1.5* 2.0  --    Liver Function Tests: Recent Labs  Lab 09/03/17 1430  AST 26  ALT 20  ALKPHOS 73  BILITOT 0.5  PROT 7.3  ALBUMIN 3.7   No results for input(s): LIPASE, AMYLASE in  the last 168 hours. No results for input(s): AMMONIA in the last 168 hours. CBC: Recent Labs  Lab 09/03/17 1430 09/03/17 2009 09/04/17 0818 09/05/17 0534 09/06/17 0505 09/07/17 0442  WBC 16.3* 13.2* 12.6* 10.2 11.7* 10.6*  NEUTROABS 12.0*  --   --   --  8.0* 6.9  HGB 10.6* 9.5* 9.3* 9.0* 9.5* 9.0*  HCT 31.4* 28.2* 27.9* 27.6* 29.9* 27.5*  MCV 88.0 87.6 88.3 87.9 89.3 88.4  PLT 229 239 243 245 294 280   Cardiac Enzymes: No results for input(s): CKTOTAL, CKMB, CKMBINDEX, TROPONINI in the last 168 hours. BNP: BNP (last 3 results) No results for input(s): BNP in the last 8760 hours.  ProBNP (last 3 results) No  results for input(s): PROBNP in the last 8760 hours.  CBG: Recent Labs  Lab 09/04/17 0807  GLUCAP 130*       Signed:  Alma Friendly, MD [facility] 09/07/2017, 12:51 PM

## 2017-09-07 NOTE — Progress Notes (Signed)
Pt discharged from the unit via wheelchair. Discharge instructions reviewed with pt and family members. No questions or concerns at this time.

## 2017-10-14 ENCOUNTER — Other Ambulatory Visit: Payer: Self-pay

## 2017-10-14 ENCOUNTER — Emergency Department (HOSPITAL_BASED_OUTPATIENT_CLINIC_OR_DEPARTMENT_OTHER): Payer: Medicare Other

## 2017-10-14 ENCOUNTER — Inpatient Hospital Stay (HOSPITAL_BASED_OUTPATIENT_CLINIC_OR_DEPARTMENT_OTHER)
Admission: EM | Admit: 2017-10-14 | Discharge: 2017-10-20 | DRG: 100 | Disposition: A | Discharge status: Home / Self Care | Payer: Medicare Other | Attending: Family Medicine | Admitting: Family Medicine

## 2017-10-14 ENCOUNTER — Encounter (HOSPITAL_BASED_OUTPATIENT_CLINIC_OR_DEPARTMENT_OTHER): Payer: Self-pay | Admitting: *Deleted

## 2017-10-14 DIAGNOSIS — Z923 Personal history of irradiation: Secondary | ICD-10-CM | POA: Diagnosis not present

## 2017-10-14 DIAGNOSIS — R569 Unspecified convulsions: Secondary | ICD-10-CM | POA: Diagnosis present

## 2017-10-14 DIAGNOSIS — I4892 Unspecified atrial flutter: Secondary | ICD-10-CM | POA: Diagnosis not present

## 2017-10-14 DIAGNOSIS — I4891 Unspecified atrial fibrillation: Secondary | ICD-10-CM | POA: Diagnosis not present

## 2017-10-14 DIAGNOSIS — E785 Hyperlipidemia, unspecified: Secondary | ICD-10-CM | POA: Diagnosis present

## 2017-10-14 DIAGNOSIS — J9601 Acute respiratory failure with hypoxia: Secondary | ICD-10-CM

## 2017-10-14 DIAGNOSIS — Z8673 Personal history of transient ischemic attack (TIA), and cerebral infarction without residual deficits: Secondary | ICD-10-CM

## 2017-10-14 DIAGNOSIS — Z7901 Long term (current) use of anticoagulants: Secondary | ICD-10-CM | POA: Diagnosis not present

## 2017-10-14 DIAGNOSIS — E2609 Other primary hyperaldosteronism: Secondary | ICD-10-CM | POA: Diagnosis present

## 2017-10-14 DIAGNOSIS — I480 Paroxysmal atrial fibrillation: Secondary | ICD-10-CM | POA: Diagnosis not present

## 2017-10-14 DIAGNOSIS — J690 Pneumonitis due to inhalation of food and vomit: Secondary | ICD-10-CM | POA: Diagnosis present

## 2017-10-14 DIAGNOSIS — I119 Hypertensive heart disease without heart failure: Secondary | ICD-10-CM | POA: Diagnosis present

## 2017-10-14 DIAGNOSIS — I340 Nonrheumatic mitral (valve) insufficiency: Secondary | ICD-10-CM | POA: Diagnosis not present

## 2017-10-14 DIAGNOSIS — Z9221 Personal history of antineoplastic chemotherapy: Secondary | ICD-10-CM | POA: Diagnosis not present

## 2017-10-14 DIAGNOSIS — D638 Anemia in other chronic diseases classified elsewhere: Secondary | ICD-10-CM | POA: Diagnosis present

## 2017-10-14 DIAGNOSIS — Z8501 Personal history of malignant neoplasm of esophagus: Secondary | ICD-10-CM | POA: Diagnosis not present

## 2017-10-14 DIAGNOSIS — D696 Thrombocytopenia, unspecified: Secondary | ICD-10-CM

## 2017-10-14 DIAGNOSIS — E269 Hyperaldosteronism, unspecified: Secondary | ICD-10-CM | POA: Diagnosis not present

## 2017-10-14 DIAGNOSIS — I483 Typical atrial flutter: Secondary | ICD-10-CM | POA: Diagnosis not present

## 2017-10-14 DIAGNOSIS — R9431 Abnormal electrocardiogram [ECG] [EKG]: Secondary | ICD-10-CM

## 2017-10-14 DIAGNOSIS — Z9119 Patient's noncompliance with other medical treatment and regimen: Secondary | ICD-10-CM

## 2017-10-14 DIAGNOSIS — F419 Anxiety disorder, unspecified: Secondary | ICD-10-CM | POA: Diagnosis present

## 2017-10-14 DIAGNOSIS — K219 Gastro-esophageal reflux disease without esophagitis: Secondary | ICD-10-CM | POA: Diagnosis present

## 2017-10-14 DIAGNOSIS — J189 Pneumonia, unspecified organism: Secondary | ICD-10-CM | POA: Diagnosis not present

## 2017-10-14 DIAGNOSIS — G40909 Epilepsy, unspecified, not intractable, without status epilepticus: Secondary | ICD-10-CM | POA: Diagnosis present

## 2017-10-14 DIAGNOSIS — Z79899 Other long term (current) drug therapy: Secondary | ICD-10-CM | POA: Diagnosis not present

## 2017-10-14 DIAGNOSIS — N183 Chronic kidney disease, stage 3 (moderate): Secondary | ICD-10-CM | POA: Diagnosis present

## 2017-10-14 DIAGNOSIS — Z5181 Encounter for therapeutic drug level monitoring: Secondary | ICD-10-CM | POA: Diagnosis not present

## 2017-10-14 DIAGNOSIS — E876 Hypokalemia: Secondary | ICD-10-CM | POA: Diagnosis present

## 2017-10-14 DIAGNOSIS — F17210 Nicotine dependence, cigarettes, uncomplicated: Secondary | ICD-10-CM | POA: Diagnosis present

## 2017-10-14 HISTORY — DX: Hyperaldosteronism, unspecified: E26.9

## 2017-10-14 HISTORY — DX: Unspecified convulsions: R56.9

## 2017-10-14 LAB — CBC
HEMATOCRIT: 36.3 % (ref 36.0–46.0)
HEMOGLOBIN: 12.2 g/dL (ref 12.0–15.0)
MCH: 28.4 pg (ref 26.0–34.0)
MCHC: 33.6 g/dL (ref 30.0–36.0)
MCV: 84.6 fL (ref 78.0–100.0)
Platelets: 371 10*3/uL (ref 150–400)
RBC: 4.29 MIL/uL (ref 3.87–5.11)
RDW: 15.4 % (ref 11.5–15.5)
WBC: 15.5 10*3/uL — ABNORMAL HIGH (ref 4.0–10.5)

## 2017-10-14 LAB — COMPREHENSIVE METABOLIC PANEL
ALBUMIN: 4.4 g/dL (ref 3.5–5.0)
ALK PHOS: 92 U/L (ref 38–126)
ALT: 14 U/L (ref 0–44)
AST: 24 U/L (ref 15–41)
Anion gap: 12 (ref 5–15)
BILIRUBIN TOTAL: 0.5 mg/dL (ref 0.3–1.2)
BUN: 23 mg/dL (ref 8–23)
CALCIUM: 10.2 mg/dL (ref 8.9–10.3)
CO2: 30 mmol/L (ref 22–32)
Chloride: 96 mmol/L — ABNORMAL LOW (ref 98–111)
Creatinine, Ser: 1.2 mg/dL — ABNORMAL HIGH (ref 0.44–1.00)
GFR calc non Af Amer: 46 mL/min — ABNORMAL LOW (ref >60)
GFR, EST AFRICAN AMERICAN: 53 mL/min — AB (ref >60)
Glucose, Bld: 124 mg/dL — ABNORMAL HIGH (ref 70–99)
POTASSIUM: 2.7 mmol/L — AB (ref 3.5–5.1)
SODIUM: 138 mmol/L (ref 135–145)
TOTAL PROTEIN: 9 g/dL — AB (ref 6.5–8.1)

## 2017-10-14 LAB — MAGNESIUM: Magnesium: 1.8 mg/dL (ref 1.7–2.4)

## 2017-10-14 MED ORDER — POTASSIUM CHLORIDE 10 MEQ/100ML IV SOLN
10.0000 meq | INTRAVENOUS | Status: AC
  Administered 2017-10-14 (×2): 10 meq via INTRAVENOUS
  Filled 2017-10-14 (×3): qty 100

## 2017-10-14 MED ORDER — VANCOMYCIN HCL IN DEXTROSE 750-5 MG/150ML-% IV SOLN
750.0000 mg | Freq: Two times a day (BID) | INTRAVENOUS | Status: DC
  Filled 2017-10-14 (×2): qty 150

## 2017-10-14 MED ORDER — LORAZEPAM 2 MG/ML IJ SOLN
1.0000 mg | INTRAMUSCULAR | Status: DC | PRN
Start: 2017-10-14 — End: 2017-10-20

## 2017-10-14 MED ORDER — LORAZEPAM 2 MG/ML IJ SOLN
1.0000 mg | Freq: Once | INTRAMUSCULAR | Status: AC
  Administered 2017-10-14: 1 mg via INTRAVENOUS

## 2017-10-14 MED ORDER — PIPERACILLIN-TAZOBACTAM 3.375 G IVPB 30 MIN
3.3750 g | Freq: Once | INTRAVENOUS | Status: AC
  Administered 2017-10-14: 3.375 g via INTRAVENOUS
  Filled 2017-10-14 (×2): qty 50

## 2017-10-14 MED ORDER — VANCOMYCIN HCL IN DEXTROSE 750-5 MG/150ML-% IV SOLN
750.0000 mg | Freq: Two times a day (BID) | INTRAVENOUS | Status: DC
  Filled 2017-10-14: qty 150

## 2017-10-14 MED ORDER — POTASSIUM CHLORIDE CRYS ER 20 MEQ PO TBCR: 40.0000 meq | EXTENDED_RELEASE_TABLET | Freq: Once | ORAL | Status: DC

## 2017-10-14 MED ORDER — LORAZEPAM 2 MG/ML IJ SOLN
INTRAMUSCULAR | Status: AC
  Administered 2017-10-14: 1 mg via INTRAVENOUS
  Filled 2017-10-14: qty 1

## 2017-10-14 MED ORDER — SODIUM CHLORIDE 0.9 % IV SOLN
1.0000 g | Freq: Three times a day (TID) | INTRAVENOUS | Status: DC
  Administered 2017-10-14 – 2017-10-15 (×2): 1 g via INTRAVENOUS
  Filled 2017-10-14 (×3): qty 1

## 2017-10-14 MED ORDER — SODIUM CHLORIDE 0.9 % IV SOLN
750.0000 mg | Freq: Two times a day (BID) | INTRAVENOUS | Status: DC
  Administered 2017-10-14: 750 mg via INTRAVENOUS
  Filled 2017-10-14 (×2): qty 7.5

## 2017-10-14 MED ORDER — LEVETIRACETAM IN NACL 1000 MG/100ML IV SOLN: 1000.0000 mg | Freq: Once | INTRAVENOUS | Status: DC

## 2017-10-14 MED ORDER — POTASSIUM CHLORIDE IN NACL 20-0.9 MEQ/L-% IV SOLN
INTRAVENOUS | Status: DC
  Administered 2017-10-14: 21:00:00 via INTRAVENOUS
  Filled 2017-10-14 (×2): qty 1000

## 2017-10-14 MED ORDER — ENOXAPARIN SODIUM 40 MG/0.4ML ~~LOC~~ SOLN
40.0000 mg | SUBCUTANEOUS | Status: DC
  Administered 2017-10-14 – 2017-10-16 (×3): 40 mg via SUBCUTANEOUS
  Filled 2017-10-14 (×4): qty 0.4

## 2017-10-14 MED ORDER — LEVETIRACETAM IN NACL 1000 MG/100ML IV SOLN
1000.0000 mg | Freq: Once | INTRAVENOUS | Status: AC
Start: 2017-10-14 — End: 2017-10-14
  Administered 2017-10-14: 1000 mg via INTRAVENOUS
  Filled 2017-10-14: qty 100

## 2017-10-14 MED ORDER — VANCOMYCIN HCL IN DEXTROSE 1-5 GM/200ML-% IV SOLN
1000.0000 mg | Freq: Once | INTRAVENOUS | Status: AC
  Administered 2017-10-14: 1000 mg via INTRAVENOUS
  Filled 2017-10-14: qty 200

## 2017-10-14 NOTE — ED Notes (Signed)
ED Provider at bedside. 

## 2017-10-14 NOTE — ED Provider Notes (Addendum)
[facility] EMERGENCY DEPARTMENT Provider Note   CSN: [id number] Arrival date & time: 10/14/17  1420     History   Chief Complaint Chief Complaint  Patient presents with  . Seizures    HPI Sheila Young is a 67 y.o. female.  CRITICAL CARE Performed by: Julianne Rice Total critical care time: 30 minutes Critical care time was exclusive of separately billable procedures and treating other patients. Critical care was necessary to treat or prevent imminent or life-threatening deterioration. Critical care was time spent personally by me on the following activities: development of treatment plan with patient and/or surrogate as well as nursing, discussions with consultants, evaluation of patient's response to treatment, examination of patient, obtaining history from patient or surrogate, ordering and performing treatments and interventions, ordering and review of laboratory studies, ordering and review of radiographic studies, pulse oximetry and re-evaluation of patient's condition.    Patient unable to contribute to history.  Level 5 caveat.  Brought in by sister who states she was called from daycare for witnessed seizure and episode of vomiting associated with hypoxia.  Sister is unsure whether patient has had seizures in the past. Past Medical History:  Diagnosis Date  . Anger   . Esophagus cancer ([place]) 04/07/2014  . 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 History of noncompliance with medical treatment   . Hypercholesteremia   . Hypertension   . Hypokalemia   . Mammogram abnormal   . Mechanical dysphagia 04/07/2014  . Radiation    54 Gy to distal esophagus  . Seizures ([place])   . Stroke [facility])     Patient Active Problem List   Diagnosis Date Noted  . PNA (pneumonia) 10/14/2017  . Pneumonia 10/14/2017  . Rectal bleeding   . Acute blood loss anemia   . Platelet inhibition due to Plavix   . Diverticulosis of colon with hemorrhage   . GI bleed 09/03/2017  . Hypokalemia  05/20/2014  . Dysphagia   . Protein-calorie malnutrition, severe ([place]) 04/09/2014  . Esophagus cancer ([place]) 04/07/2014  . Mechanical dysphagia 04/07/2014  . Esophageal cancer ([place]) 04/07/2014    Past Surgical History:  Procedure Laterality Date  . IR REMOVAL TUN ACCESS W/ PORT W/O FL MOD SED  02/18/2017     OB History   None      Home Medications    Prior to Admission medications   Medication Sig Start Date End Date Taking? Authorizing Provider  amLODipine (NORVASC) 10 MG tablet Take 0.5 tablets (5 mg total) by mouth daily. Patient taking differently: Take 10 mg by mouth daily.  12/15/14   Volanda Napoleon, MD  atorvastatin (LIPITOR) 40 MG tablet Take 40 mg by mouth every morning.  03/16/14   [provider]  famotidine (PEPCID) 20 MG tablet Take 1 tablet (20 mg total) by mouth 2 (two) times daily. 06/09/17   Charlesetta Shanks, MD  fexofenadine (ALLEGRA) 180 MG tablet Take 180 mg by mouth as needed for allergies.  04/27/14 09/04/17  [provider]  FLUoxetine (PROZAC) 10 MG capsule Take 1 capsule (10 mg total) by mouth daily. 08/13/16   Volanda Napoleon, MD  folic acid (FOLVITE) 1 MG tablet TAKE ONE TABLET BY MOUTH ONCE DAILY. (ROUND YELLOW TAB WITH V 31/62) 12/11/16   Volanda Napoleon, MD  levETIRAcetam (KEPPRA) 500 MG tablet Take 500 mg by mouth 2 (two) times daily.  11/22/15 09/04/17  [provider]  losartan (COZAAR) 50 MG tablet Take 50 mg by mouth daily.  [provider]  magnesium gluconate (MAGONATE) 500 MG tablet Take 1 tablet (500 mg total) by mouth daily. 250 mg daily Patient taking differently: Take 250 mg by mouth daily. 250 mg daily 11/09/15   Volanda Napoleon, MD  metoCLOPramide (REGLAN) 10 MG tablet Take 1 tablet (10 mg total) by mouth 3 (three) times daily before meals. 06/09/17   Charlesetta Shanks, MD  pantoprazole (PROTONIX) 40 MG tablet Take 1 tablet (40 mg total) by mouth daily. 01/11/15   Volanda Napoleon, MD  potassium chloride (KLOR-CON)  20 MEQ packet Take 40 mEq by mouth 2 (two) times daily. Take 2 packets by mouth 2 times daily for the next 3 days.  Then go back to your regular dose of 2 packets once daily. 06/09/17   Charlesetta Shanks, MD    Family History Family History  Problem Relation Age of Onset  . GI Bleed Neg Hx   . GI Disease Neg Hx   . Stroke Neg Hx     Social History Social History   Tobacco Use  . Smoking status: Current Every Day Smoker    Packs/day: 0.50    Years: 45.00    Pack years: 22.50    Types: Cigarettes    Start date: 06/05/1968  . Smokeless tobacco: Never Used  . Tobacco comment: 06/18/16 still smoking  Substance Use Topics  . Alcohol use: No    Alcohol/week: 0.0 oz  . Drug use: No     Allergies   Patient has no known allergies.   Review of Systems Review of Systems  Unable to perform ROS: Patient nonverbal     Physical Exam Updated Vital Signs BP (!) 160/80 (BP Location: Left Arm)   Pulse 76   Temp 98.9 F (37.2 C) (Oral)   Resp 16   Ht 5\' 6"  (1.676 m)   Wt 82.6 kg (182 lb 1.6 oz)   SpO2 100%   BMI 29.39 kg/m   Physical Exam  Constitutional: She appears well-developed and well-nourished. No distress.  HENT:  Head: Normocephalic and atraumatic.  Patient is grinding teeth and clenching jaw.  Eyes: Pupils are equal, round, and reactive to light. EOM are normal.  Neck: Normal range of motion. Neck supple. No JVD present.  Cardiovascular: Regular rhythm.  Tachycardia into the 140s.  Pulmonary/Chest: Effort normal. She has rales.  Few scattered rhonchi  Abdominal: Soft. Bowel sounds are normal. There is no tenderness. There is no rebound and no guarding.  Musculoskeletal: Normal range of motion. She exhibits no edema or tenderness.  No lower extremity swelling, asymmetry or tenderness.  Lymphadenopathy:    She has no cervical adenopathy.  Neurological:  Patient is not responding to commands.  Appears to move all extremities without focal deficit.  Skin: Skin is  warm and dry. Capillary refill takes less than 2 seconds. No rash noted. She is not diaphoretic. No erythema.  Nursing note and vitals reviewed.    ED Treatments / Results  Labs (all labs ordered are listed, but only abnormal results are displayed) Labs Reviewed  CBC - Abnormal; Notable for the following components:      Result Value   WBC 15.5 (*)    All other components within normal limits  COMPREHENSIVE METABOLIC PANEL - Abnormal; Notable for the following components:   Potassium 2.7 (*)    Chloride 96 (*)    Glucose, Bld 124 (*)    Creatinine, Ser 1.20 (*)    Total Protein 9.0 (*)  GFR calc non Af Amer 46 (*)    GFR calc Af Amer 53 (*)    All other components within normal limits  CULTURE, BLOOD (ROUTINE X 2)  CULTURE, BLOOD (ROUTINE X 2)  CULTURE, EXPECTORATED SPUTUM-ASSESSMENT  GRAM STAIN  RESPIRATORY PANEL BY PCR  MAGNESIUM  URINALYSIS, ROUTINE W REFLEX MICROSCOPIC  HIV ANTIBODY (ROUTINE TESTING)  STREP PNEUMONIAE URINARY ANTIGEN  CBC  COMPREHENSIVE METABOLIC PANEL    EKG EKG Interpretation  Date/Time:  Tuesday October 14 2017 14:41:26 EDT Ventricular Rate:  90 PR Interval:    QRS Duration: 94 QT Interval:  413 QTC Calculation: 506 R Axis:   37 Text Interpretation:  Sinus rhythm Borderline repolarization abnormality Prolonged QT interval Confirmed by Julianne Rice [phone]) on 10/14/2017 11:28:31 PM   Radiology Ct Head Wo Contrast  Result Date: 10/14/2017 CLINICAL DATA:  Seizure EXAM: CT HEAD WITHOUT CONTRAST TECHNIQUE: Contiguous axial images were obtained from the base of the skull through the vertex without intravenous contrast. COMPARISON:  CT head 11/22/2015 FINDINGS: Brain: Extensive chronic ischemic changes. Chronic infarct left occipital lobe unchanged. Chronic infarcts in the basal ganglia and white matter bilaterally unchanged. Chronic ischemia in the pons. Ventricle size normal. Negative for acute infarct, hemorrhage, or mass lesion. Vascular:  Negative for hyperdense vessel Skull: Negative Sinuses/Orbits: Negative Other: None IMPRESSION: Extensive chronic ischemic change stable from the prior study. No acute intracranial abnormality. Electronically Signed   By: Franchot Gallo M.D.   On: 10/14/2017 15:16   Dg Chest Port 1 View  Result Date: 10/14/2017 CLINICAL DATA:  Seizure at daycare today, hypoxia. EXAM: PORTABLE CHEST 1 VIEW COMPARISON:  09/04/2017 FINDINGS: Slightly lower lung volumes than on prior. Cardiomegaly is noted with aortic atherosclerosis. Confluent opacities along the medial aspect of the right heart border and left lung base with vascular congestion and diffuse interstitial edema is seen. No acute fracture nor joint dislocations. IMPRESSION: 1. Low lung volumes with stable cardiomegaly and aortic atherosclerosis. 2. Interstitial edema. 3. Confluent bibasilar opacities raise suspicion for pneumonia and/or atelectasis. Aspiration is not entirely excluded. Electronically Signed   By: Ashley Royalty M.D.   On: 10/14/2017 15:51    Procedures Procedures (including critical care time)  Medications Ordered in ED Medications  LORazepam (ATIVAN) injection 1-2 mg (has no administration in time range)  potassium chloride SA (K-DUR,KLOR-CON) CR tablet 40 mEq (has no administration in time range)  enoxaparin (LOVENOX) injection 40 mg (40 mg Subcutaneous Given 10/14/17 2123)  ceFEPIme (MAXIPIME) 1 g in sodium chloride 0.9 % 100 mL IVPB (1 g Intravenous New Bag/Given 10/14/17 2119)  0.9 % NaCl with KCl 20 mEq/ L  infusion ( Intravenous New Bag/Given 10/14/17 2112)  vancomycin (VANCOCIN) IVPB 750 mg/150 ml premix (has no administration in time range)  levETIRAcetam (KEPPRA) 750 mg in sodium chloride 0.9 % 100 mL IVPB (750 mg Intravenous New Bag/Given 10/14/17 2327)  LORazepam (ATIVAN) injection 1 mg (1 mg Intravenous Given 10/14/17 1525)  levETIRAcetam (KEPPRA) IVPB 1000 mg/100 mL premix (0 mg Intravenous Stopped 10/14/17 1551)  potassium chloride 10  mEq in 100 mL IVPB (10 mEq Intravenous New Bag/Given 10/14/17 1717)  piperacillin-tazobactam (ZOSYN) IVPB 3.375 g (0 g Intravenous Stopped 10/14/17 1700)  vancomycin (VANCOCIN) IVPB 1000 mg/200 mL premix (1,000 mg Intravenous New Bag/Given 10/14/17 1700)     Initial Impression / Assessment and Plan / ED Course  I have reviewed the triage vital signs and the nursing notes.  Pertinent labs & imaging results that were available during my care  of the patient were reviewed by me and considered in my medical decision making (see chart for details).     Concern patient is having a partial seizure.  Given IV Ativan with improvement of tachycardia.  Patient now following simple commands though drowsy.  Will load IV Keppra. Discussed with Dr. Leonel Ramsay.  Agrees with loading Keppra.  Advises IV Keppra 1000 mg twice daily.  Will consult on patient when patient [facility] to [facility].  Spoke with hospitalist who will accept patient in transfer. Bilateral pulmonary infiltrates concerning for likely aspiration.  Initiated IV antibiotics. Final Clinical Impressions(s) / ED Diagnoses   Final diagnoses:  Seizure-like activity ([place])  Aspiration pneumonia of both lower lobes due to vomit [facility])    ED Discharge Orders    None       Julianne Rice, MD 10/14/17 8850    Julianne Rice, MD 12/25/17 [phone]

## 2017-10-14 NOTE — Progress Notes (Signed)
Pharmacy Antibiotic Note  Sheila Young is a 67 y.o. female with history of CVA and seizure disorder who presented to the ED from daycare on 10/14/2017 s/p witnessed seizure with possible pneumonia.  Pharmacy has been consulted for Vancomycin dosing. Patient is also receiving Cefepime.   Patient received 1g IV Vancomycin in the ED at 1700 PM.  WBC is elevated. Patient is afebrile.  SCr 1.20, estimated CrCl ~50 mL/min.  CXR - bibasilar opacities concerning for pneumonia.   Plan: Vancomycin 750mg  IV every 12 hours.  Continue Cefepime 1g IV every  8 hours for now - borderline CrCl.  Monitor renal function, culture results, and clinical status.  Follow-up ability to de-escalate quickly.   Height: 5\' 6"  (167.6 cm) Weight: 182 lb 1.6 oz (82.6 kg) IBW/kg (Calculated) : 59.3  Temp (24hrs), Avg:98.9 F (37.2 C), Min:98.9 F (37.2 C), Max:98.9 F (37.2 C)  Recent Labs  Lab 10/14/17 1442  WBC 15.5*  CREATININE 1.20*    Estimated Creatinine Clearance: 49.9 mL/min (A) (by C-G formula based on SCr of 1.2 mg/dL (H)).    No Known Allergies  Antimicrobials this admission: Zosyn 7/9 IV X1 Vancomycin 7/9 >> Cefepime 7/9 >>  Dose adjustments this admission:  Microbiology results: 7/9 Sputum >> 7/9 Blood >> 7/9 Respiratory Panel PCR >>  Thank you for allowing pharmacy to be a part of this patient's care.  Sloan Leiter, PharmD, BCPS, BCCCP Clinical Pharmacist Clinical phone 10/14/2017 until 11:30PM [phone] After hours, please call [phone] 10/14/2017 7:29 PM

## 2017-10-14 NOTE — ED Triage Notes (Signed)
Daughter states pt was at Day care today and had a witnessed seizure. HX cva , daughter states pt was her norm this am when she dropped her off

## 2017-10-14 NOTE — ED Notes (Addendum)
Seizure activity diminished.  Pt resting  VSS

## 2017-10-14 NOTE — H&P (Signed)
TRH H&P   Patient Demographics:    America Sandall, is a 67 y.o. female  MRN: [id number]   DOB - [birth year]  Admit Date - 10/14/2017  Outpatient Primary MD for the patient is Beckie Salts, MD  Referring MD/NP/PA:   Julianne Rice  Outpatient Specialists:    Patient coming from: home=> adult day care  Chief Complaint  Patient presents with  . Seizures      HPI:    Rashonda Warrior  is a 67 y.o. female, w hypertension, hyperlipidemia, CVA, Seizures apparently was noted to have a seizure at adult day care and also to  be hypoxic  With pox 70-80's.  Pt was apparently brought to ED where had another seizure.    In Ed,  CT brain IMPRESSION: Extensive chronic ischemic change stable from the prior study. No acute intracranial abnormality.  CXR  IMPRESSION: 1. Low lung volumes with stable cardiomegaly and aortic atherosclerosis. 2. Interstitial edema. 3. Confluent bibasilar opacities raise suspicion for pneumonia and/or atelectasis. Aspiration is not entirely excluded.  Wbc 15.5, Hgb 12.2, Plt 371 Na 138, K 2.7 Bun 23, Creatinine 1.20  Ast 24, Alt 14  Magnesium 1.8  Pt transferred to [facility] for admission. For admission for seizure, Hcap vs aspiration pneumonia, and hypokalemia.       Review of systems:    In addition to the HPI above, No Fever-chills, No Headache, No changes with Vision or hearing, No problems swallowing food or Liquids, No Chest pain, No Cough or Shortness of Breath No Abdominal pain, No Nausea or Vommitting, Bowel movements are regular, No Blood in stool or Urine, No dysuria, No new skin rashes or bruises, No new joints pains-aches,  No new weakness, tingling, numbness in any extremity, No recent weight gain or loss, No polyuria, polydypsia or polyphagia, No significant Mental Stressors.  A full 10 point Review of Systems was done, except as  stated above, all other Review of Systems were negative.   With Past History of the following :    Past Medical History:  Diagnosis Date  . Anger   . Esophagus cancer ([place]) 04/07/2014  . 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 History of noncompliance with medical treatment   . Hypercholesteremia   . Hypertension   . Hypokalemia   . Mammogram abnormal   . Mechanical dysphagia 04/07/2014  . Radiation    54 Gy to distal esophagus  . Seizures ([place])   . Stroke [facility])       Past Surgical History:  Procedure Laterality Date  . IR REMOVAL TUN ACCESS W/ PORT W/O FL MOD SED  02/18/2017      Social History:     Social History   Tobacco Use  . Smoking status: Current Every Day Smoker    Packs/day: 0.50    Years: 45.00    Pack years: 22.50    Types: Cigarettes  Start date: 06/05/1968  . Smokeless tobacco: Never Used  . Tobacco comment: 06/18/16 still smoking  Substance Use Topics  . Alcohol use: No    Alcohol/week: 0.0 oz     Lives - at home  Mobility -unclear if walks   Family History :     Family History  Problem Relation Age of Onset  . GI Bleed Neg Hx   . GI Disease Neg Hx   . Stroke Neg Hx        Home Medications:   Prior to Admission medications   Medication Sig Start Date End Date Taking? Authorizing Provider  amLODipine (NORVASC) 10 MG tablet Take 0.5 tablets (5 mg total) by mouth daily. Patient taking differently: Take 10 mg by mouth daily.  12/15/14   Volanda Napoleon, MD  atorvastatin (LIPITOR) 40 MG tablet Take 40 mg by mouth every morning.  03/16/14   [provider]  famotidine (PEPCID) 20 MG tablet Take 1 tablet (20 mg total) by mouth 2 (two) times daily. 06/09/17   Charlesetta Shanks, MD  fexofenadine (ALLEGRA) 180 MG tablet Take 180 mg by mouth as needed for allergies.  04/27/14 09/04/17  [provider]  FLUoxetine (PROZAC) 10 MG capsule Take 1 capsule (10 mg total) by mouth daily. 08/13/16   Volanda Napoleon, MD  folic acid (FOLVITE) 1 MG tablet  TAKE ONE TABLET BY MOUTH ONCE DAILY. (ROUND YELLOW TAB WITH V 31/62) 12/11/16   Volanda Napoleon, MD  levETIRAcetam (KEPPRA) 500 MG tablet Take 500 mg by mouth 2 (two) times daily.  11/22/15 09/04/17  [provider]  losartan (COZAAR) 50 MG tablet Take 50 mg by mouth daily.    [provider]  magnesium gluconate (MAGONATE) 500 MG tablet Take 1 tablet (500 mg total) by mouth daily. 250 mg daily Patient taking differently: Take 250 mg by mouth daily. 250 mg daily 11/09/15   Volanda Napoleon, MD  metoCLOPramide (REGLAN) 10 MG tablet Take 1 tablet (10 mg total) by mouth 3 (three) times daily before meals. 06/09/17   Charlesetta Shanks, MD  pantoprazole (PROTONIX) 40 MG tablet Take 1 tablet (40 mg total) by mouth daily. 01/11/15   Volanda Napoleon, MD  potassium chloride (KLOR-CON) 20 MEQ packet Take 40 mEq by mouth 2 (two) times daily. Take 2 packets by mouth 2 times daily for the next 3 days.  Then go back to your regular dose of 2 packets once daily. 06/09/17   Charlesetta Shanks, MD     Allergies:    No Known Allergies   Physical Exam:   Vitals  Blood pressure (!) 160/80, pulse 76, temperature 98.9 F (37.2 C), temperature source Oral, resp. rate 16, height 5\' 6"  (1.676 m), weight 82.6 kg (182 lb 1.6 oz), SpO2 100 %.   1. General  lying in bed in NAD,    2. Normal affect and insight, Not Suicidal or Homicidal, Awake Alert, Oriented X 3.  3. No F.N deficits, ALL C.Nerves Intact, Strength 5/5 all 4 extremities, Sensation intact all 4 extremities, Plantars down going.  4. Ears and Eyes appear Normal, Conjunctivae clear, PERRLA. Moist Oral Mucosa.  5. Supple Neck, No JVD, No cervical lymphadenopathy appriciated, No Carotid Bruits.  6. Symmetrical Chest wall movement, Good air movement bilaterally, CTAB.  7. RRR, No Gallops, Rubs or Murmurs, No Parasternal Heave.  8. Positive Bowel Sounds, Abdomen Soft, No tenderness, No organomegaly appriciated,No rebound -guarding or  rigidity.  9.  No Cyanosis, Normal Skin Turgor,  No Skin Rash or Bruise.  10. Good muscle tone,  joints appear normal , no effusions, Normal ROM.  11. No Palpable Lymph Nodes in Neck or Axillae      Data Review:    CBC Recent Labs  Lab 10/14/17 1442  WBC 15.5*  HGB 12.2  HCT 36.3  PLT 371  MCV 84.6  MCH 28.4  MCHC 33.6  RDW 15.4   ------------------------------------------------------------------------------------------------------------------  Chemistries  Recent Labs  Lab 10/14/17 1442  NA 138  K 2.7*  CL 96*  CO2 30  GLUCOSE 124*  BUN 23  CREATININE 1.20*  CALCIUM 10.2  MG 1.8  AST 24  ALT 14  ALKPHOS 92  BILITOT 0.5   ------------------------------------------------------------------------------------------------------------------ estimated creatinine clearance is 49.9 mL/min (A) (by C-G formula based on SCr of 1.2 mg/dL (H)). ------------------------------------------------------------------------------------------------------------------ No results for input(s): TSH, T4TOTAL, T3FREE, THYROIDAB in the last 72 hours.  Invalid input(s): FREET3  Coagulation profile No results for input(s): INR, PROTIME in the last 168 hours. ------------------------------------------------------------------------------------------------------------------- No results for input(s): DDIMER in the last 72 hours. -------------------------------------------------------------------------------------------------------------------  Cardiac Enzymes No results for input(s): CKMB, TROPONINI, MYOGLOBIN in the last 168 hours.  Invalid input(s): CK ------------------------------------------------------------------------------------------------------------------ No results found for: BNP   ---------------------------------------------------------------------------------------------------------------  Urinalysis    Component Value Date/Time   COLORURINE YELLOW 09/03/2017 1441    APPEARANCEUR CLOUDY (A) 09/03/2017 1441   LABSPEC 1.010 09/03/2017 1441   PHURINE 6.5 09/03/2017 1441   GLUCOSEU NEGATIVE 09/03/2017 1441   HGBUR SMALL (A) 09/03/2017 1441   BILIRUBINUR NEGATIVE 09/03/2017 1441   KETONESUR NEGATIVE 09/03/2017 1441   PROTEINUR NEGATIVE 09/03/2017 1441   NITRITE NEGATIVE 09/03/2017 1441   LEUKOCYTESUR NEGATIVE 09/03/2017 1441    ----------------------------------------------------------------------------------------------------------------   Imaging Results:    Ct Head Wo Contrast  Result Date: 10/14/2017 CLINICAL DATA:  Seizure EXAM: CT HEAD WITHOUT CONTRAST TECHNIQUE: Contiguous axial images were obtained from the base of the skull through the vertex without intravenous contrast. COMPARISON:  CT head 11/22/2015 FINDINGS: Brain: Extensive chronic ischemic changes. Chronic infarct left occipital lobe unchanged. Chronic infarcts in the basal ganglia and white matter bilaterally unchanged. Chronic ischemia in the pons. Ventricle size normal. Negative for acute infarct, hemorrhage, or mass lesion. Vascular: Negative for hyperdense vessel Skull: Negative Sinuses/Orbits: Negative Other: None IMPRESSION: Extensive chronic ischemic change stable from the prior study. No acute intracranial abnormality. Electronically Signed   By: Franchot Gallo M.D.   On: 10/14/2017 15:16   Dg Chest Port 1 View  Result Date: 10/14/2017 CLINICAL DATA:  Seizure at daycare today, hypoxia. EXAM: PORTABLE CHEST 1 VIEW COMPARISON:  09/04/2017 FINDINGS: Slightly lower lung volumes than on prior. Cardiomegaly is noted with aortic atherosclerosis. Confluent opacities along the medial aspect of the right heart border and left lung base with vascular congestion and diffuse interstitial edema is seen. No acute fracture nor joint dislocations. IMPRESSION: 1. Low lung volumes with stable cardiomegaly and aortic atherosclerosis. 2. Interstitial edema. 3. Confluent bibasilar opacities raise suspicion for  pneumonia and/or atelectasis. Aspiration is not entirely excluded. Electronically Signed   By: Ashley Royalty M.D.   On: 10/14/2017 15:51       Assessment & Plan:    Active Problems:   PNA (pneumonia)   Pneumonia    Seizure, unclear if seizure caused aspiration or pneumonia/ hypoxia caused seizure Urinalysis pending MRI brain  EEG Keppra 1000mg  iv x1 received in ED Cont keppra 500mg  po bid  Hcap vs aspiration pneumonia Blood culture x2 Sputum gram stain, culture Urine  legionella antigen Urine strep antigen vanco iv, cefepime iv pharmacy to dose.   Hypokalemia Replete Check cmp in am  Hypertension Cont amlodipine 10mg  po qday Cont Losartan 50mg  po qday  Hyperlipidemia Cont Lipitor40mg  po qhs  Anxiety Cont Fluoxetine 10mg  po qday  Gerd Cont PPI     DVT Prophylaxis Lovenox - SCDs  AM Labs Ordered, also please review Full Orders  Family Communication: Admission, patients condition and plan of care including tests being ordered have been discussed with the patient  who indicate understanding and agree with the plan and Code Status.  Code Status FULL CODE  Likely DC to  home  Condition GUARDED   Consults called:  None, please call neurology in AM  Admission status:  inpatient  Time spent in minutes : 70   Jani Gravel M.D on 10/14/2017 at 9:52 PM  Between 7am to 7pm - Pager - [phone]  . After 7pm go to [email or web address] - password [facility]  [facility] - Office  [phone]

## 2017-10-14 NOTE — ED Notes (Signed)
Family called for help in room.  States she saw patients eyes twitching and acting strange.  Upon arrival, monitor notes tachycardia at 150's, pt actively pulling at blankets and lips slightly twitching.  Pt answering questions but continues to be pulling at covers.  Noted pox 87%.  Resp called, Dr. Lita Mains at bedside.  Abnormal behavior lasted a minute or two, then patient began grinding teeth.  Pt placed on 100% NRB at 15L.  Order for ativan.

## 2017-10-14 NOTE — Plan of Care (Signed)
[facility] transfer discussed with Dr. Lita Mains. Sheila Young is a 67 year old female with past medical history of CVA with residual dysphasia, Sz disorder; who presented after having seizure at daycare.  CT showing stable chronic ischemic changes.  Neurology consulted over the phone.  Patient was noted to be hypoxic into the 70-80s during presumed seizure, and was given 1 mg of Ativan with resolution of symptoms.  Additionally patient received empiric antibiotics of vancomycin and Zosyn for what appeared to be possible pneumonia/aspiration event.  [facility] called to admit.  Admitted as inpatient to a telemetry bed.

## 2017-10-15 ENCOUNTER — Other Ambulatory Visit (HOSPITAL_COMMUNITY): Payer: 59

## 2017-10-15 ENCOUNTER — Inpatient Hospital Stay (HOSPITAL_COMMUNITY): Payer: Medicare Other

## 2017-10-15 DIAGNOSIS — R9431 Abnormal electrocardiogram [ECG] [EKG]: Secondary | ICD-10-CM

## 2017-10-15 DIAGNOSIS — J690 Pneumonitis due to inhalation of food and vomit: Secondary | ICD-10-CM

## 2017-10-15 DIAGNOSIS — R569 Unspecified convulsions: Secondary | ICD-10-CM

## 2017-10-15 DIAGNOSIS — J9601 Acute respiratory failure with hypoxia: Secondary | ICD-10-CM

## 2017-10-15 DIAGNOSIS — E876 Hypokalemia: Secondary | ICD-10-CM

## 2017-10-15 LAB — COMPREHENSIVE METABOLIC PANEL
ALBUMIN: 3 g/dL — AB (ref 3.5–5.0)
ALT: 11 U/L (ref 0–44)
AST: 15 U/L (ref 15–41)
Alkaline Phosphatase: 68 U/L (ref 38–126)
Anion gap: 10 (ref 5–15)
BUN: 14 mg/dL (ref 8–23)
CHLORIDE: 105 mmol/L (ref 98–111)
CO2: 27 mmol/L (ref 22–32)
CREATININE: 1.02 mg/dL — AB (ref 0.44–1.00)
Calcium: 9.1 mg/dL (ref 8.9–10.3)
GFR calc Af Amer: >60 mL/min (ref >60)
GFR calc non Af Amer: 56 mL/min — ABNORMAL LOW (ref >60)
GLUCOSE: 116 mg/dL — AB (ref 70–99)
Potassium: 2.4 mmol/L — CL (ref 3.5–5.1)
SODIUM: 142 mmol/L (ref 135–145)
Total Bilirubin: 0.9 mg/dL (ref 0.3–1.2)
Total Protein: 6.4 g/dL — ABNORMAL LOW (ref 6.5–8.1)

## 2017-10-15 LAB — RESPIRATORY PANEL BY PCR
ADENOVIRUS-RVPPCR: NOT DETECTED
BORDETELLA PERTUSSIS-RVPCR: NOT DETECTED
CORONAVIRUS 229E-RVPPCR: NOT DETECTED
CORONAVIRUS HKU1-RVPPCR: NOT DETECTED
CORONAVIRUS NL63-RVPPCR: NOT DETECTED
Chlamydophila pneumoniae: NOT DETECTED
Coronavirus OC43: NOT DETECTED
Influenza A: NOT DETECTED
Influenza B: NOT DETECTED
METAPNEUMOVIRUS-RVPPCR: NOT DETECTED
Mycoplasma pneumoniae: NOT DETECTED
PARAINFLUENZA VIRUS 1-RVPPCR: NOT DETECTED
PARAINFLUENZA VIRUS 2-RVPPCR: NOT DETECTED
Parainfluenza Virus 3: NOT DETECTED
Parainfluenza Virus 4: NOT DETECTED
Respiratory Syncytial Virus: NOT DETECTED
Rhinovirus / Enterovirus: NOT DETECTED

## 2017-10-15 LAB — URINALYSIS, ROUTINE W REFLEX MICROSCOPIC
BILIRUBIN URINE: NEGATIVE
Glucose, UA: NEGATIVE mg/dL
HGB URINE DIPSTICK: NEGATIVE
Ketones, ur: NEGATIVE mg/dL
Leukocytes, UA: NEGATIVE
NITRITE: NEGATIVE
PH: 7 (ref 5.0–8.0)
Protein, ur: NEGATIVE mg/dL
SPECIFIC GRAVITY, URINE: 1.01 (ref 1.005–1.030)

## 2017-10-15 LAB — CBC
HCT: 30.6 % — ABNORMAL LOW (ref 36.0–46.0)
Hemoglobin: 9.6 g/dL — ABNORMAL LOW (ref 12.0–15.0)
MCH: 27.1 pg (ref 26.0–34.0)
MCHC: 31.4 g/dL (ref 30.0–36.0)
MCV: 86.4 fL (ref 78.0–100.0)
PLATELETS: 291 10*3/uL (ref 150–400)
RBC: 3.54 MIL/uL — AB (ref 3.87–5.11)
RDW: 15.3 % (ref 11.5–15.5)
WBC: 11.6 10*3/uL — AB (ref 4.0–10.5)

## 2017-10-15 LAB — STREP PNEUMONIAE URINARY ANTIGEN: STREP PNEUMO URINARY ANTIGEN: NEGATIVE

## 2017-10-15 LAB — HIV ANTIBODY (ROUTINE TESTING W REFLEX): HIV SCREEN 4TH GENERATION: NONREACTIVE

## 2017-10-15 MED ORDER — METOCLOPRAMIDE HCL 10 MG PO TABS
10.0000 mg | ORAL_TABLET | Freq: Three times a day (TID) | ORAL | Status: DC
  Administered 2017-10-15 – 2017-10-20 (×18): 10 mg via ORAL
  Filled 2017-10-15 (×18): qty 1

## 2017-10-15 MED ORDER — POTASSIUM CHLORIDE 20 MEQ PO PACK
40.0000 meq | PACK | Freq: Every day | ORAL | Status: DC
  Administered 2017-10-15: 40 meq via ORAL
  Filled 2017-10-15 (×2): qty 2

## 2017-10-15 MED ORDER — ATORVASTATIN CALCIUM 40 MG PO TABS
40.0000 mg | ORAL_TABLET | Freq: Every day | ORAL | Status: DC
  Administered 2017-10-15 – 2017-10-20 (×6): 40 mg via ORAL
  Filled 2017-10-15 (×6): qty 1

## 2017-10-15 MED ORDER — POTASSIUM CHLORIDE CRYS ER 20 MEQ PO TBCR
40.0000 meq | EXTENDED_RELEASE_TABLET | Freq: Once | ORAL | Status: AC
  Administered 2017-10-15: 40 meq via ORAL
  Filled 2017-10-15: qty 2

## 2017-10-15 MED ORDER — SODIUM CHLORIDE 0.9 % IV SOLN
3.0000 g | Freq: Three times a day (TID) | INTRAVENOUS | Status: DC
  Administered 2017-10-15 – 2017-10-17 (×6): 3 g via INTRAVENOUS
  Filled 2017-10-15 (×7): qty 3

## 2017-10-15 MED ORDER — LEVETIRACETAM 500 MG PO TABS
500.0000 mg | ORAL_TABLET | Freq: Two times a day (BID) | ORAL | Status: DC
  Administered 2017-10-15 – 2017-10-20 (×11): 500 mg via ORAL
  Filled 2017-10-15 (×11): qty 1

## 2017-10-15 MED ORDER — LORATADINE 10 MG PO TABS
10.0000 mg | ORAL_TABLET | Freq: Every day | ORAL | Status: DC
  Administered 2017-10-15 – 2017-10-20 (×6): 10 mg via ORAL
  Filled 2017-10-15 (×6): qty 1

## 2017-10-15 MED ORDER — LOSARTAN POTASSIUM 50 MG PO TABS
50.0000 mg | ORAL_TABLET | Freq: Every day | ORAL | Status: DC
  Administered 2017-10-15 – 2017-10-20 (×6): 50 mg via ORAL
  Filled 2017-10-15 (×6): qty 1

## 2017-10-15 MED ORDER — FAMOTIDINE 20 MG PO TABS
20.0000 mg | ORAL_TABLET | Freq: Two times a day (BID) | ORAL | Status: DC
  Administered 2017-10-15 – 2017-10-20 (×11): 20 mg via ORAL
  Filled 2017-10-15 (×11): qty 1

## 2017-10-15 MED ORDER — LEVETIRACETAM IN NACL 500 MG/100ML IV SOLN
500.0000 mg | Freq: Two times a day (BID) | INTRAVENOUS | Status: DC
  Filled 2017-10-15: qty 100

## 2017-10-15 MED ORDER — AMLODIPINE BESYLATE 10 MG PO TABS
10.0000 mg | ORAL_TABLET | Freq: Every day | ORAL | Status: DC
  Administered 2017-10-15 – 2017-10-20 (×6): 10 mg via ORAL
  Filled 2017-10-15 (×6): qty 1

## 2017-10-15 MED ORDER — MAGNESIUM GLUCONATE 500 MG PO TABS
250.0000 mg | ORAL_TABLET | Freq: Every day | ORAL | Status: DC
  Administered 2017-10-15 – 2017-10-16 (×2): 250 mg via ORAL
  Filled 2017-10-15 (×3): qty 1

## 2017-10-15 MED ORDER — FLUOXETINE HCL 10 MG PO CAPS
10.0000 mg | ORAL_CAPSULE | Freq: Every day | ORAL | Status: DC
  Administered 2017-10-15 – 2017-10-20 (×6): 10 mg via ORAL
  Filled 2017-10-15 (×6): qty 1

## 2017-10-15 MED ORDER — FOLIC ACID 1 MG PO TABS
1.0000 mg | ORAL_TABLET | Freq: Every day | ORAL | Status: DC
  Administered 2017-10-15 – 2017-10-20 (×6): 1 mg via ORAL
  Filled 2017-10-15 (×6): qty 1

## 2017-10-15 MED ORDER — LEVETIRACETAM 500 MG PO TABS: 500.0000 mg | ORAL_TABLET | Freq: Two times a day (BID) | ORAL | Status: DC

## 2017-10-15 MED ORDER — PANTOPRAZOLE SODIUM 40 MG PO TBEC
40.0000 mg | DELAYED_RELEASE_TABLET | Freq: Every day | ORAL | Status: DC
  Administered 2017-10-15 – 2017-10-20 (×6): 40 mg via ORAL
  Filled 2017-10-15 (×6): qty 1

## 2017-10-15 NOTE — Evaluation (Signed)
Clinical/Bedside Swallow Evaluation Patient Details  Name: Sheila Young MRN: [id number] Date of Birth: 02/03/1951  Today's Date: 10/15/2017 Time: SLP Start Time (ACUTE ONLY): 8101 SLP Stop Time (ACUTE ONLY): 0852 SLP Time Calculation (min) (ACUTE ONLY): 13 min  Past Medical History:  Past Medical History:  Diagnosis Date  . Anger   . Esophagus cancer ([place]) 04/07/2014  . 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 History of noncompliance with medical treatment   . Hypercholesteremia   . Hypertension   . Hypokalemia   . Mammogram abnormal   . Mechanical dysphagia 04/07/2014  . Radiation    54 Gy to distal esophagus  . Seizures ([place])   . Stroke [facility])    Past Surgical History:  Past Surgical History:  Procedure Laterality Date  . IR REMOVAL TUN ACCESS W/ PORT W/O FL MOD SED  02/18/2017   HPI:  Sheila Young  is a 67 y.o. female, w hypertension, esophageal cander with radiation 2015, hyperlipidemia, CVA, Seizures apparently was noted to have a seizure at adult day care and also to  be hypoxic. CT brain extensive chronic ischemic change stable from the prior study. No acute intracranial abnormality. CXR Confluent bibasilar opacities raise suspicion for pneumonia and/or atelectasis. Aspiration is not entirely excluded.   Assessment / Plan / Recommendation Clinical Impression  Suspicious for fibrotic tissue at larynx/pharyx s/p radiation for esophageal cancer 2015. No outward indications of aspiration noted. Palpation to swallow noted stiff movement with audible swallow. History of stroke and CXR possible pna warrants instrumental swallow study. MBS scheduled today 9:30. SLP Visit Diagnosis: Dysphagia, unspecified (R13.10)    Aspiration Risk  Moderate aspiration risk    Diet Recommendation NPO        Other  Recommendations     Follow up Recommendations        Frequency and Duration            Prognosis        Swallow Study   General HPI: Sheila Young  is a 67 y.o. female, w  hypertension, esophageal cander with radiation 2015, hyperlipidemia, CVA, Seizures apparently was noted to have a seizure at adult day care and also to  be hypoxic. CT brain extensive chronic ischemic change stable from the prior study. No acute intracranial abnormality. CXR Confluent bibasilar opacities raise suspicion for pneumonia and/or atelectasis. Aspiration is not entirely excluded. Type of Study: Bedside Swallow Evaluation Previous Swallow Assessment: (none) Diet Prior to this Study: NPO Temperature Spikes Noted: No Respiratory Status: Nasal cannula History of Recent Intubation: No Behavior/Cognition: Lethargic/Drowsy;Cooperative;Pleasant mood Oral Cavity Assessment: Other (comment)(? lingual candidias) Oral Care Completed by SLP: No Oral Cavity - Dentition: Poor condition;Missing dentition Vision: Functional for self-feeding Self-Feeding Abilities: Able to feed self Patient Positioning: Upright in bed Baseline Vocal Quality: Normal Volitional Swallow: Able to elicit    Oral/Motor/Sensory Function Overall Oral Motor/Sensory Function: Within functional limits   Ice Chips Ice chips: Not tested   Thin Liquid Thin Liquid: Within functional limits Presentation: Cup;Straw    Nectar Thick Nectar Thick Liquid: Not tested   Honey Thick Honey Thick Liquid: Not tested   Puree Puree: Not tested   Solid   GO   Solid: Within functional limits        Sheila Young 10/15/2017,8:58 AM  Sheila Young Sheila Young.Ed [employer] [phone]

## 2017-10-15 NOTE — Progress Notes (Signed)
Modified Barium Swallow Progress Note  Patient Details  Name: Beatryce Colombo MRN: [id number] Date of Birth: 17-Nov-1950  Today's Date: 10/15/2017  Modified Barium Swallow completed.  Full report located under Chart Review in the Imaging Section.  Brief recommendations include the following:  Clinical Impression  Pt's oral and pharyngeal swallow function was within normal limits druing this study. Sensory, motor, protective measures and coordination of swallow initiation was appropriate. No penetration or aspiration observed. Evidence of possible esophageal dysphagia with barium pill stopping at GE junction and was not transited into stomach despite multiple sips liquid and heavier textures likely result of radiation s/p esophageal cancer 2015. Recommend regular diet, thin liquids, pills with water or consider crushing if large, follow solids with liquids and upright posture after meals. Will follow up for continued and brief education.    Swallow Evaluation Recommendations       SLP Diet Recommendations: Regular solids;Thin liquid   Liquid Administration via: Cup;Straw   Medication Administration: Whole meds with liquid   Supervision: Patient able to self feed   Compensations: Slow rate;Small sips/bites   Postural Changes: Remain semi-upright after after feeds/meals (Comment);Seated upright at 90 degrees   Oral Care Recommendations: Oral care BID        [name] 10/15/2017,11:52 AM   [name].Ed [employer] [phone]

## 2017-10-15 NOTE — Progress Notes (Signed)
Pharmacy Antibiotic Note  Sheila Young is a 67 y.o. female with history of CVA and seizure disorder who presented to the ED from daycare on 10/14/2017 s/p witnessed seizure with possible pneumonia.  Pharmacy has been consulted for Unasyn dosing.  Plan: Stop vancomycin and cefepime Start Unasyn 3g IV Q8h Monitor clinical picture, renal function F/U C&S, abx deescalation / LOT   Height: 5\' 6"  (167.6 cm) Weight: 182 lb 1.6 oz (82.6 kg) IBW/kg (Calculated) : 59.3  Temp (24hrs), Avg:98.7 F (37.1 C), Min:98.4 F (36.9 C), Max:98.9 F (37.2 C)  Recent Labs  Lab 10/14/17 1442 10/15/17 0259  WBC 15.5* 11.6*  CREATININE 1.20* 1.02*    Estimated Creatinine Clearance: 58.8 mL/min (A) (by C-G formula based on SCr of 1.02 mg/dL (H)).    No Known Allergies   Thank you for allowing pharmacy to be a part of this patient's care.  Elenor Quinones, PharmD, BCPS Clinical Pharmacist Phone number [phone] 10/15/2017 12:49 PM

## 2017-10-15 NOTE — Progress Notes (Signed)
NEURO HOSPITALIST PROGRESS NOTE   Subjective: Patient asleep, eye closed in bed, NAD on 2L [place]. No seizure like activity noted. Easily aroused with light touch and name calling. Patient stated that she feels better. Did not know the name of the hospital, but knew she was at a hospital. All she remembers from the seizure is that she was not at home, but was sleeping and had a seizure. Also could not remember the name of the seizure medication that she was prescribed reports that she did not take it yesterday, and may have missed more than just the dose yesterday. Stated that she has been diagnosed with seizures for " a long time".  Exam: Vitals:   10/14/17 1700 10/15/17 0006  BP: (!) 160/80 (!) 150/77  Pulse: 76 74  Resp: 16 15  Temp:  98.4 F (36.9 C)  SpO2: 100% 99%    Physical Exam   HEENT-  Normocephalic, no lesions, without obvious abnormality.  Normal external eye and conjunctiva.   Cardiovascular- , pulses palpable throughout   Lungs-no excessive working breathing.  Saturations within normal limits on 2L [place] Extremities- Warm, dry and intact, SCD's on BLE Musculoskeletal-no joint tenderness, deformity or swelling Skin-warm and dry, intact   Neuro:  Mental Status: Drowsy but oriented to person/place ( not the name)/ year/ month.  Speech fluent without evidence of aphasia.  Able to follow commands without difficulty. Cranial Nerves: II:  Right hemianopia.  III,IV, VI: ptosis not present, extra-ocular motions intact bilaterally pupils equal, round, reactive to light and accommodation V,VII: smile symmetric, facial light touch sensation normal bilaterally VIII: hearing normal bilaterally IX,X: uvula rises symmetrically XI: bilateral shoulder shrug XII: midline tongue extension Motor: Right : Upper extremity   5/5    Left:     Upper extremity   5/5  Lower extremity   5/5     Lower extremity   5/5 Tone and bulk:normal tone throughout; no atrophy  noted Sensory: Pinprick and light touch intact throughout, bilaterally Deep Tendon Reflexes: 2+ and symmetric biceps, patella Plantars: Right: downgoing   Left: downgoing Cerebellar: normal finger-to-nose, normal rapid alternating movements, deferred HTS  Gait: deferred    Medications:  Scheduled: . amLODipine  10 mg Oral Daily  . atorvastatin  40 mg Oral q1800  . enoxaparin (LOVENOX) injection  40 mg Subcutaneous Q24H  . famotidine  20 mg Oral BID  . FLUoxetine  10 mg Oral Daily  . folic acid  1 mg Oral Daily  . loratadine  10 mg Oral Daily  . losartan  50 mg Oral Daily  . magnesium gluconate  250 mg Oral Daily  . metoCLOPramide  10 mg Oral TID AC  . pantoprazole  40 mg Oral Daily  . potassium chloride  40 mEq Oral Daily  . potassium chloride  40 mEq Oral Once   Continuous: . 0.9 % NaCl with KCl 20 mEq / L 75 mL/hr at 10/14/17 2112  . ceFEPime (MAXIPIME) IV 1 g (10/15/17 6440)  . levETIRAcetam 750 mg (10/14/17 2327)  . vancomycin     HKV:[id number]  Pertinent Labs/Diagnostics:   Ct Head Wo Contrast  Result Date: 10/14/2017 CLINICAL DATA:  Seizure EXAM: CT HEAD WITHOUT CONTRAST TECHNIQUE: Contiguous axial images were obtained from the base of the skull through the vertex without intravenous contrast. COMPARISON:  CT head 11/22/2015 FINDINGS: Brain: Extensive chronic ischemic changes. Chronic  infarct left occipital lobe unchanged. Chronic infarcts in the basal ganglia and white matter bilaterally unchanged. Chronic ischemia in the pons. Ventricle size normal. Negative for acute infarct, hemorrhage, or mass lesion. Vascular: Negative for hyperdense vessel Skull: Negative Sinuses/Orbits: Negative Other: None IMPRESSION: Extensive chronic ischemic change stable from the prior study. No acute intracranial abnormality. Electronically Signed   By: Franchot Gallo M.D.   On: 10/14/2017 15:16   Dg Chest Port 1 View  Result Date: 10/14/2017 CLINICAL DATA:  Seizure at daycare today,  hypoxia. EXAM: PORTABLE CHEST 1 VIEW COMPARISON:  09/04/2017 FINDINGS: Slightly lower lung volumes than on prior. Cardiomegaly is noted with aortic atherosclerosis. Confluent opacities along the medial aspect of the right heart border and left lung base with vascular congestion and diffuse interstitial edema is seen. No acute fracture nor joint dislocations. IMPRESSION: 1. Low lung volumes with stable cardiomegaly and aortic atherosclerosis. 2. Interstitial edema. 3. Confluent bibasilar opacities raise suspicion for pneumonia and/or atelectasis. Aspiration is not entirely excluded. Electronically Signed   By: Ashley Royalty M.D.   On: 10/14/2017 15:51   Laurey Morale, MSN, NP-C [facility] [phone]  Attending neurologist's note to follow    Impression:  67 y.o. female past medical history of stroke, seizures on Keppra (since september 2017) presents to the emergency room after having two witnessed seizures. Suspect seizures due to missed meds caused aspiration rather than vice-versa but difficult to be certain. With reported two days of missed doses prior to seizure, I would not change dose. EEG would not change dose.    Recommendations:  -- No need for EEG -- continue Keppra twice daily 500 mg --seizure precautions  --PNA per IM --Neurology to sign off at this time. Please call with any further questions or concerns. --Per [facility] statutes, patients with seizures are not allowed to drive until  they have been seizure-free for six months. Use caution when using heavy equipment or power tools. Avoid working on ladders or at heights. Take showers instead of baths. Ensure the water temperature is not too high on the home water heater. Do not go swimming alone. When caring for infants or small children, sit down when holding, feeding, or changing them to minimize risk of injury to the child in the event you have a seizure.   Also, Maintain good sleep hygiene. Avoid  alcohol.  --> Call 911 and bring the patient back to the ED if:                   A.  The seizure lasts longer than 5 minutes.                  B.  The patient doesn't awaken shortly after the seizure             C.  The patient has new problems such as difficulty seeing, speaking or moving             D.  The patient was injured during the seizure             E.  The patient has a temperature over 102 F (39C)             F.  The patient vomited and now is having trouble breathing   Roland Rack, MD [facility] [phone]  If 7pm- 7am, please page neurology on call as listed in [place].  10/15/2017, 8:21 AM

## 2017-10-15 NOTE — Consult Note (Signed)
Requesting Physician: Dr. Tamala Julian    Chief Complaint: Seizure  History obtained from: Patient and Chart     HPI:                                                                                                                                       Sheila Young is an 67 y.o. female past medical history of stroke, seizures on Keppra, hyperlipidemia, hypertension,, esophagus cancer presents to the emergency room after having a witnessed seizure at a dental daycare.  According to chart, the episode of throwing up and then became hypoxic to the 70s and 80s.  She then became lethargic.  Patient was loaded with Keppra after discussing with neurology.  She is currently being treated with antibiotics for possible aspiration pneumonia.    On assessment upon transfer to [facility] patient is drowsy however will follow simple commands.  CT head was negative for any acute abnormality.  She has had no further clinical events concerning for seizures so far.   Past Medical History:  Diagnosis Date  . Anger   . Esophagus cancer ([place]) 04/07/2014  . 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 History of noncompliance with medical treatment   . Hypercholesteremia   . Hypertension   . Hypokalemia   . Mammogram abnormal   . Mechanical dysphagia 04/07/2014  . Radiation    54 Gy to distal esophagus  . Seizures ([place])   . Stroke [facility])     Past Surgical History:  Procedure Laterality Date  . IR REMOVAL TUN ACCESS W/ PORT W/O FL MOD SED  02/18/2017    Family History  Problem Relation Age of Onset  . GI Bleed Neg Hx   . GI Disease Neg Hx   . Stroke Neg Hx    Social History:  reports that she has been smoking cigarettes.  She started smoking about 49 years ago. She has a 22.50 pack-year smoking history. She has never used smokeless tobacco. She reports that she does not drink alcohol or use drugs.  Allergies: No Known Allergies  Medications:                                                                                                                         I reviewed home medications   ROS:  14 systems reviewed and negative except above   Examination:                                                                                                      General: Appears well-developed and well-nourished.  Psych: Affect appropriate to situation Eyes: No scleral injection HENT: No OP obstrucion Head: Normocephalic.  Cardiovascular: Normal rate and regular rhythm.  Respiratory: Effort normal and breath sounds normal to anterior ascultation GI: Soft.  No distension. There is no tenderness.  Skin: WDI    Neurological Examination Mental Status: Drowsy, oriented to her name.  Would intermittently follow commands after repeated requests and then go back to sleep. Cranial Nerves: II: Visual fields : Difficult to assess but blinks to threat bilaterally III,IV, VI: No forced gaze deviation V,VII: smile symmetric, facial light touch sensation normal bilaterally VIII: hearing normal bilaterally IX,X: uvula rises symmetrically XI: bilateral shoulder shrug XII: midline tongue extension Motor: Right : Upper extremity   5/5    Left:     Upper extremity   5/5  Lower extremity   5/5     Lower extremity   5/5 Tone and bulk:normal tone throughout; no atrophy noted Sensory: Pinprick and light touch intact throughout, bilaterally Deep Tendon Reflexes: 2+ ovwer petalla/biceps on left side, 3+ over patella, biceps on right side Plantars: Right: downgoing   Left: downgoing Cerebellar: normal finger-to-nose, normal rapid alternating movements and normal heel-to-shin test Gait: normal gait and station     Lab Results: Basic Metabolic Panel: Recent Labs  Lab 10/14/17 1442  NA 138  K 2.7*  CL 96*  CO2 30  GLUCOSE 124*  BUN 23  CREATININE 1.20*  CALCIUM 10.2  MG 1.8     CBC: Recent Labs  Lab 10/14/17 1442  WBC 15.5*  HGB 12.2  HCT 36.3  MCV 84.6  PLT 371    Coagulation Studies: No results for input(s): LABPROT, INR in the last 72 hours.  Imaging: Ct Head Wo Contrast  Result Date: 10/14/2017 CLINICAL DATA:  Seizure EXAM: CT HEAD WITHOUT CONTRAST TECHNIQUE: Contiguous axial images were obtained from the base of the skull through the vertex without intravenous contrast. COMPARISON:  CT head 11/22/2015 FINDINGS: Brain: Extensive chronic ischemic changes. Chronic infarct left occipital lobe unchanged. Chronic infarcts in the basal ganglia and white matter bilaterally unchanged. Chronic ischemia in the pons. Ventricle size normal. Negative for acute infarct, hemorrhage, or mass lesion. Vascular: Negative for hyperdense vessel Skull: Negative Sinuses/Orbits: Negative Other: None IMPRESSION: Extensive chronic ischemic change stable from the prior study. No acute intracranial abnormality. Electronically Signed   By: Franchot Gallo M.D.   On: 10/14/2017 15:16   Dg Chest Port 1 View  Result Date: 10/14/2017 CLINICAL DATA:  Seizure at daycare today, hypoxia. EXAM: PORTABLE CHEST 1 VIEW COMPARISON:  09/04/2017 FINDINGS: Slightly lower lung volumes than on prior. Cardiomegaly is noted with aortic atherosclerosis. Confluent opacities along the medial aspect of the right heart border and left lung base with vascular congestion and diffuse interstitial edema is seen. No acute fracture nor joint dislocations. IMPRESSION:  1. Low lung volumes with stable cardiomegaly and aortic atherosclerosis. 2. Interstitial edema. 3. Confluent bibasilar opacities raise suspicion for pneumonia and/or atelectasis. Aspiration is not entirely excluded. Electronically Signed   By: Ashley Royalty M.D.   On: 10/14/2017 15:51     I have reviewed the above imaging CT head was unremarkable for any acute pathology   ASSESSMENT AND PLAN     Seizure Breakthrough seizure with no clear provoking  factor identified yet. Structural lesion from prior stroke likely focus for seizure.  Exam is not concerning for subclinical seizures  Increase Keppra to 750 mg twice daily Continue to treat for aspiration pneumonia So far no other signs for infection to explain possible seizures EEG in the morning if patient has not returned to baseline (and therefor order not placed)  Seizure precuations    Sushanth Aroor [facility] Pager Number [phone]

## 2017-10-15 NOTE — Progress Notes (Signed)
PROGRESS NOTE  Sheila Young UEA:[id number] DOB: Mar 12, 1951 DOA: 10/14/2017 PCP: Beckie Salts, MD  Brief Narrative: 67yow PMH seizure disorder tx with Keppra, presented with seziure at adult day care, vomiting and hypoxia. CT head no acute inracranial abnormality. CXR suggested pneumonia vs atelectasis.   Assessment/Plan Seizure disorder, known, with seizure prior to admission. Thought secondary to missed medications based on history. Neurology rec no EEG, continue Keppra at 500 mg BID, continue seizure precautions --continue Keppra (change to oral). Monitor.  Aspiration pneumonia with acute hypoxic resp failure. PMH esophageal cancer 2015. --afebrile, VSS, sat 99 on [place]. Wean oxygen to RA. --ST rec regular diet, thin liquids. --change to Unasyn to cover anaerobes  Hypokalemia --replete. Mg WNL  Chronic normocytic anemia, stable --suspect anemia of chronic disease --f/u as an outpatient  Essential HTN --stable; continue amlodipine, losartan.   Prolonged QT --replete potassium, check BMP and EKG in AM  PMH stroke  Per [facility] statutes, patients with seizures are not allowed to drive until  they have been seizure-free for six months. Use caution when using heavy equipment or power tools. Avoid working on ladders or at heights. Take showers instead of baths. Ensure the water temperature is not too high on the home water heater. Do not go swimming alone. When caring for infants or small children, sit down when holding, feeding, or changing them to minimize risk of injury to the child in the event you have a seizure.   Also, Maintain good sleep hygiene. Avoid alcohol.  -->Call 911 and bring the patient back to the ED if:  A. The seizure lasts longer than 5 minutes.  B. The patient doesn't awaken shortly after the seizure C. The patient has new problems such as difficulty seeing, speaking or moving D. The patient  was injured during the seizure E. The patient has a temperature over 102 F (39C) F. The patient vomited and now is having trouble breathing  DVT prophylaxis: enoxaparin Code Status: full Family Communication: none Disposition Plan: prior living arrangement    Murray Hodgkins, MD  [facility] Direct contact: [phone] --[place]ia [place]  --[email or web address]; password [email or web address]  7PM-7AM contact night coverage as above 10/15/2017, 12:44 PM  LOS: 1 day   Consultants:    Procedures:    Antimicrobials:  Cefepime 7/9-7/10  Unasyn 7/10 >>  Vancomycin 7/9  Interval history/Subjective: Feels better. Still SOB. No vomiting.  Objective: Vitals:  Vitals:   10/15/17 0006 10/15/17 1228  BP: (!) 150/77 (!) 160/75  Pulse: 74   Resp: 15   Temp: 98.4 F (36.9 C)   SpO2: 99%     Exam:  Constitutional:  . Appears calm and comfortable Eyes:  . pupils appear normal ENMT:  . grossly normal hearing  Respiratory:  . CTA bilaterally, no w/r/r.  . Respiratory effort normal Cardiovascular:  . RRR, no m/r/g . No LE extremity edema   Musculoskeletal:  . RUE, LUE, RLE, LLE   o strength and tone normal, no abnormal movements Psychiatric:  . Mental status o Mood, affect appropriate o Orientation to person, hospital, year  I have personally reviewed the following:   Labs:  K+ 2.4, CMP otherwise unremarkable  Hgb stable 9.6  WBC 15.5 > 11.6  U/a negative  Imaging studies:  CT head no acute abnormalities  CXR bibasilar infiltrates  Medical tests:  EKG SR, prolonged QT   Test discussed with performing physician:    Decision to obtain old records:    Review and summation of  old records:    Scheduled Meds: . amLODipine  10 mg Oral Daily  . atorvastatin  40 mg Oral q1800  . enoxaparin (LOVENOX) injection  40 mg Subcutaneous Q24H  . famotidine  20 mg Oral BID  . FLUoxetine  10 mg Oral Daily  . folic acid  1 mg Oral Daily    . levETIRAcetam  500 mg Oral BID  . loratadine  10 mg Oral Daily  . losartan  50 mg Oral Daily  . magnesium gluconate  250 mg Oral Daily  . metoCLOPramide  10 mg Oral TID AC  . pantoprazole  40 mg Oral Daily  . potassium chloride  40 mEq Oral Daily  . potassium chloride  40 mEq Oral Once   Continuous Infusions: . ampicillin-sulbactam (UNASYN) IV      Principal Problem:   Seizure (HCC) Active Problems:   Hypokalemia   Aspiration pneumonia of both lower lobes due to gastric secretions (HCC)   Acute hypoxemic respiratory failure (HCC)   Prolonged QT interval   LOS: 1 day

## 2017-10-16 LAB — BASIC METABOLIC PANEL
Anion gap: 11 (ref 5–15)
BUN: 15 mg/dL (ref 8–23)
CO2: 28 mmol/L (ref 22–32)
Calcium: 9.2 mg/dL (ref 8.9–10.3)
Chloride: 103 mmol/L (ref 98–111)
Creatinine, Ser: 1.05 mg/dL — ABNORMAL HIGH (ref 0.44–1.00)
GFR calc Af Amer: >60 mL/min (ref >60)
GFR, EST NON AFRICAN AMERICAN: 54 mL/min — AB (ref >60)
GLUCOSE: 105 mg/dL — AB (ref 70–99)
POTASSIUM: 2.5 mmol/L — AB (ref 3.5–5.1)
Sodium: 142 mmol/L (ref 135–145)

## 2017-10-16 LAB — MAGNESIUM: Magnesium: 1.7 mg/dL (ref 1.7–2.4)

## 2017-10-16 MED ORDER — MAGNESIUM SULFATE 2 GM/50ML IV SOLN
2.0000 g | Freq: Once | INTRAVENOUS | Status: AC
  Administered 2017-10-16: 2 g via INTRAVENOUS
  Filled 2017-10-16: qty 50

## 2017-10-16 MED ORDER — POTASSIUM CHLORIDE 20 MEQ PO PACK
40.0000 meq | PACK | ORAL | Status: AC
  Administered 2017-10-16 (×3): 40 meq via ORAL
  Filled 2017-10-16 (×3): qty 2

## 2017-10-16 NOTE — Progress Notes (Signed)
  Speech Language Pathology Treatment: Dysphagia  Patient Details Name: Sheila Young MRN: [id number] DOB: June 12, 1950 Today's Date: 10/16/2017 Time: 6144-3154 SLP Time Calculation (min) (ACUTE ONLY): 9 min  Assessment / Plan / Recommendation Clinical Impression  Pt consumed regular textures and thin liquids with no overt signs of aspiration. Results of MBS were reviewed with pt, who verbalized her understanding although she does present with some confusion. Education was provided about esophageal precautions. Pt says that she tends to select softer foods that are easier to chew and swallow. Would continue with current diet and precautions - no further acute SLP f/u indicated.   HPI HPI: Sheila Young  is a 66 y.o. female, w hypertension, esophageal cander with radiation 2015, hyperlipidemia, CVA, Seizures apparently was noted to have a seizure at adult day care and also to  be hypoxic. CT brain extensive chronic ischemic change stable from the prior study. No acute intracranial abnormality. CXR Confluent bibasilar opacities raise suspicion for pneumonia and/or atelectasis. Aspiration is not entirely excluded.      SLP Plan  All goals met       Recommendations  Diet recommendations: Regular;Thin liquid Liquids provided via: Cup;Straw Medication Administration: Whole meds with liquid(crush if large) Supervision: Patient able to self feed;Intermittent supervision to cue for compensatory strategies Compensations: Slow rate;Small sips/bites Postural Changes and/or Swallow Maneuvers: Seated upright 90 degrees;Upright 30-60 min after meal                Oral Care Recommendations: Oral care BID Follow up Recommendations: None SLP Visit Diagnosis: Dysphagia, unspecified (R13.10) Plan: All goals met       GO                Germain Osgood 10/16/2017, 11:01 AM  Germain Osgood, M.A. CCC-SLP [phone]

## 2017-10-16 NOTE — Progress Notes (Signed)
CRITICAL VALUE ALERT  Critical Value:  Potassium 2.4  Date & Time Notied:  10/16/17 0620 Provider Notified: Samuella Bruin Orders Received/Actions taken:

## 2017-10-16 NOTE — Progress Notes (Signed)
PROGRESS NOTE  Sheila Young FYB:[id number] DOB: 1951-01-08 DOA: 10/14/2017 PCP: Beckie Salts, MD  Brief Narrative: 67yow PMH seizure disorder tx with Keppra, presented with seziure at adult day care, vomiting and hypoxia. CT head no acute inracranial abnormality. CXR suggested pneumonia vs atelectasis.   Assessment/Plan Aspiration pneumonia with acute hypoxic resp failure. PMH esophageal cancer 2015. --Remains afebrile.  Oxygen requirement has decreased.  Will wean oxygen today as tolerated.   --Continue regular diet, thin liquids per speech therapy.   --Change to oral antibiotics 7/12.  Hypokalemia --Still significantly low.  Replete today aggressively.  Replete magnesium.  Seizure disorder, known, with seizure prior to admission. Thought secondary to missed medications based on history. Neurology rec no EEG, continue Keppra at 500 mg BID, continue seizure precautions --No recurrent seizures.  Continue Keppra per neurology.    Essential HTN --Remains stable, continue amlodipine, losartan.   Chronic normocytic anemia, stable --suspect anemia of chronic disease --f/u as an outpatient  Prolonged QT --Resolved.  Secondary to electrolyte abnormalities.  PMH stroke  Per [facility] statutes, patients with seizures are not allowed to drive until  they have been seizure-free for six months. Use caution when using heavy equipment or power tools. Avoid working on ladders or at heights. Take showers instead of baths. Ensure the water temperature is not too high on the home water heater. Do not go swimming alone. When caring for infants or small children, sit down when holding, feeding, or changing them to minimize risk of injury to the child in the event you have a seizure.   Also, Maintain good sleep hygiene. Avoid alcohol.  -->Call 911 and bring the patient back to the ED if:  A. The seizure lasts longer than 5 minutes.  B. The patient doesn't  awaken shortly after the seizure C. The patient has new problems such as difficulty seeing, speaking or moving D. The patient was injured during the seizure E. The patient has a temperature over 102 F (39C) F. The patient vomited and now is having trouble breathing  DVT prophylaxis: enoxaparin Code Status: full Family Communication: none Disposition Plan: prior living arrangement    Murray Hodgkins, MD  [facility] Direct contact: [phone] --Via [place]  --[email or web address]; password [email or web address]  7PM-7AM contact night coverage as above 10/16/2017, 11:09 AM  LOS: 2 days   Consultants:    Procedures:    Antimicrobials:  Cefepime 7/9-7/10  Unasyn 7/10 >>  Vancomycin 7/9  Interval history/Subjective: Feels fine, breathing fine, no complaints, no seizures.  Objective: Vitals:  Vitals:   10/16/17 0747 10/16/17 1103  BP: (!) 155/69 (!) 151/69  Pulse: 68   Resp: 17   Temp: 98.1 F (36.7 C)   SpO2: 96%     Exam: Constitutional:   . Appears calm and comfortable Respiratory:  . CTA bilaterally, no w/r/r.  . Respiratory effort normal.  Cardiovascular:  . RRR, no m/r/g Psychiatric:  . Mental status o Mood, affect appropriate  I have personally reviewed the following:   Labs:  Potassium 2.5.  Creatinine stable 1.05.  Magnesium borderline, 1.7.  Imaging studies:    Medical tests:  EKG independently reviewed: Sinus rhythm, no acute changes.  Test discussed with performing physician:    Decision to obtain old records:    Review and summation of old records:    Scheduled Meds: . amLODipine  10 mg Oral Daily  . atorvastatin  40 mg Oral q1800  . enoxaparin (LOVENOX) injection  40 mg Subcutaneous Q24H  .  famotidine  20 mg Oral BID  . FLUoxetine  10 mg Oral Daily  . folic acid  1 mg Oral Daily  . levETIRAcetam  500 mg Oral BID  . loratadine  10 mg Oral Daily  . losartan  50 mg Oral  Daily  . magnesium gluconate  250 mg Oral Daily  . metoCLOPramide  10 mg Oral TID AC  . pantoprazole  40 mg Oral Daily  . potassium chloride  40 mEq Oral Q4H   Continuous Infusions: . ampicillin-sulbactam (UNASYN) IV 3 g (10/16/17 4033)    Principal Problem:   Seizure (HCC) Active Problems:   Hypokalemia   Aspiration pneumonia of both lower lobes due to gastric secretions (HCC)   Acute hypoxemic respiratory failure (HCC)   Prolonged QT interval   LOS: 2 days

## 2017-10-17 ENCOUNTER — Inpatient Hospital Stay (HOSPITAL_COMMUNITY): Payer: Medicare Other

## 2017-10-17 DIAGNOSIS — I340 Nonrheumatic mitral (valve) insufficiency: Secondary | ICD-10-CM

## 2017-10-17 DIAGNOSIS — I480 Paroxysmal atrial fibrillation: Secondary | ICD-10-CM

## 2017-10-17 DIAGNOSIS — I4892 Unspecified atrial flutter: Secondary | ICD-10-CM

## 2017-10-17 DIAGNOSIS — E269 Hyperaldosteronism, unspecified: Secondary | ICD-10-CM

## 2017-10-17 DIAGNOSIS — Z5181 Encounter for therapeutic drug level monitoring: Secondary | ICD-10-CM

## 2017-10-17 DIAGNOSIS — Z7901 Long term (current) use of anticoagulants: Secondary | ICD-10-CM

## 2017-10-17 HISTORY — DX: Hyperaldosteronism, unspecified: E26.9

## 2017-10-17 LAB — ECHOCARDIOGRAM COMPLETE
Height: 66 in
Weight: 2913.6 oz

## 2017-10-17 LAB — MAGNESIUM: MAGNESIUM: 1.6 mg/dL — AB (ref 1.7–2.4)

## 2017-10-17 LAB — BASIC METABOLIC PANEL
Anion gap: 14 (ref 5–15)
BUN: 15 mg/dL (ref 8–23)
CALCIUM: 9.1 mg/dL (ref 8.9–10.3)
CHLORIDE: 104 mmol/L (ref 98–111)
CO2: 25 mmol/L (ref 22–32)
CREATININE: 1.15 mg/dL — AB (ref 0.44–1.00)
GFR calc Af Amer: 56 mL/min — ABNORMAL LOW (ref >60)
GFR calc non Af Amer: 48 mL/min — ABNORMAL LOW (ref >60)
Glucose, Bld: 111 mg/dL — ABNORMAL HIGH (ref 70–99)
Potassium: 2.9 mmol/L — ABNORMAL LOW (ref 3.5–5.1)
Sodium: 143 mmol/L (ref 135–145)

## 2017-10-17 LAB — PHOSPHORUS: PHOSPHORUS: 2.5 mg/dL (ref 2.5–4.6)

## 2017-10-17 MED ORDER — MAGNESIUM SULFATE 2 GM/50ML IV SOLN: 2.0000 g | Freq: Once | INTRAVENOUS | Status: DC

## 2017-10-17 MED ORDER — METOPROLOL SUCCINATE ER 50 MG PO TB24
50.0000 mg | ORAL_TABLET | Freq: Every day | ORAL | Status: DC
  Administered 2017-10-17: 50 mg via ORAL
  Filled 2017-10-17: qty 1

## 2017-10-17 MED ORDER — POTASSIUM CHLORIDE CRYS ER 20 MEQ PO TBCR
40.0000 meq | EXTENDED_RELEASE_TABLET | ORAL | Status: AC
  Administered 2017-10-17 (×2): 40 meq via ORAL
  Filled 2017-10-17 (×2): qty 2

## 2017-10-17 MED ORDER — CLOPIDOGREL BISULFATE 75 MG PO TABS
75.0000 mg | ORAL_TABLET | Freq: Every day | ORAL | Status: DC
  Administered 2017-10-17 – 2017-10-18 (×2): 75 mg via ORAL
  Filled 2017-10-17 (×2): qty 1

## 2017-10-17 MED ORDER — METOPROLOL SUCCINATE ER 50 MG PO TB24: 50.0000 mg | ORAL_TABLET | Freq: Every day | ORAL | Status: DC

## 2017-10-17 MED ORDER — ENOXAPARIN SODIUM 80 MG/0.8ML ~~LOC~~ SOLN
80.0000 mg | Freq: Two times a day (BID) | SUBCUTANEOUS | Status: DC
  Administered 2017-10-17 – 2017-10-18 (×3): 80 mg via SUBCUTANEOUS
  Filled 2017-10-17 (×3): qty 0.8

## 2017-10-17 MED ORDER — AMOXICILLIN-POT CLAVULANATE 875-125 MG PO TABS
1.0000 | ORAL_TABLET | Freq: Two times a day (BID) | ORAL | Status: AC
  Administered 2017-10-17 – 2017-10-19 (×5): 1 via ORAL
  Filled 2017-10-17 (×5): qty 1

## 2017-10-17 MED ORDER — METOPROLOL TARTRATE 5 MG/5ML IV SOLN
5.0000 mg | Freq: Once | INTRAVENOUS | Status: AC
  Administered 2017-10-17: 5 mg via INTRAVENOUS
  Filled 2017-10-17: qty 5

## 2017-10-17 MED ORDER — METOPROLOL SUCCINATE ER 50 MG PO TB24
75.0000 mg | ORAL_TABLET | Freq: Every day | ORAL | Status: DC
  Administered 2017-10-18 – 2017-10-19 (×2): 75 mg via ORAL
  Filled 2017-10-17 (×2): qty 1

## 2017-10-17 MED ORDER — MAGNESIUM OXIDE 400 (241.3 MG) MG PO TABS
400.0000 mg | ORAL_TABLET | Freq: Two times a day (BID) | ORAL | Status: DC
  Administered 2017-10-17 – 2017-10-20 (×7): 400 mg via ORAL
  Filled 2017-10-17 (×7): qty 1

## 2017-10-17 MED ORDER — SPIRONOLACTONE 25 MG PO TABS
50.0000 mg | ORAL_TABLET | Freq: Every day | ORAL | Status: DC
  Administered 2017-10-17 – 2017-10-20 (×4): 50 mg via ORAL
  Filled 2017-10-17 (×7): qty 2

## 2017-10-17 MED ORDER — POTASSIUM CHLORIDE 20 MEQ PO PACK: 40.0000 meq | PACK | ORAL | Status: DC

## 2017-10-17 MED ORDER — MAGNESIUM SULFATE 2 GM/50ML IV SOLN
2.0000 g | Freq: Once | INTRAVENOUS | Status: AC
  Administered 2017-10-17: 2 g via INTRAVENOUS
  Filled 2017-10-17: qty 50

## 2017-10-17 NOTE — Care Management Important Message (Signed)
Important Message  Patient Details  Name: Sheila Young MRN: [id number] Date of Birth: [birth year]   Medicare Important Message Given:  Yes    [place] Crownover [address] 10/17/2017, 3:14 PM

## 2017-10-17 NOTE — Progress Notes (Signed)
  Echocardiogram 2D Echocardiogram has been performed.  The patient requested to end the test before it was completed.    Sheila Young 10/17/2017, 11:53 AM

## 2017-10-17 NOTE — Consult Note (Addendum)
Cardiology Consultation:   Patient ID: Sheila Young; [id number]; [birth year]   Admit date: 10/14/2017 Date of Consult: 10/17/2017  Primary Care Provider: Beckie Salts, MD Primary Cardiologist: New, Dr Meda Coffee Primary Electrophysiologist:  n/a   Patient Profile:   Sheila Young is a 67 y.o. 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  History of Present Illness:   Sheila Young was admitted 05/29 from [facility] w/ LGIB, hematochezia, felt 2nd diverticular bleed. No transfusion required. Sx resolved. Pt to f/u with GI.   On 7/9, she was found to have a seizure at adult daycare and was hypoxic with O2 saturations in the 70s-80s.  She was brought to the emergency room where she had another seizure.  She turned out to have missed some medications.  Her head CT showed extensive chronic ischemic changes but was stable.    She had interstitial edema on her chest x-ray, pneumonia or atelectasis and possibly aspiration.    Her white count was elevated at 15.5, her potassium level 2.7.  Her potassium level has remained low since admission, she has gotten between 80 and 120 mEq of potassium chloride every day since admission without a significant increase in her potassium level.  Her ECG at that time was clearly sinus rhythm, some mild diffuse lateral ST depression is unchanged from 2015.  Her QT was prolonged at 506 ms, a change from 2015.  On 7/11, she spontaneously went into rapid course atrial fibrillation versus atypical atrial flutter, converted spontaneously.  This a.m., she did it again.  She has been placed on a beta-blocker and started on enoxaparin.  After she was given Toprol-XL 50 mg, her heart  rate improved and then she spontaneously converted to sinus rhythm.  Cardiology was asked to evaluate her.  Sheila Young is able to answer simple questions but she is not a good historian.  She says that her heart goes fast sometimes, when she runs, which she says she did yesterday.  She says she has never passed out.  She denies chest pain or shortness of breath.  Chart notes are reviewed and there are no reports or documentation of any chest pain or shortness of breath.  She is currently resting comfortably.   Past Medical History:  Diagnosis Date  . Anger   . Esophagus cancer ([place]) 04/07/2014  . 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 History of noncompliance with medical treatment   . Hypercholesteremia   . Hypertension   . Hypokalemia   . Mammogram abnormal   . Mechanical dysphagia 04/07/2014  . Radiation    54 Gy to distal esophagus  . Seizures ([place])   . Stroke [facility])     Past Surgical History:  Procedure Laterality Date  . IR REMOVAL TUN ACCESS W/ PORT W/O FL MOD SED  02/18/2017     Prior to Admission medications   Medication Sig Start Date End Date Taking? Authorizing Provider  amLODipine (NORVASC) 10 MG tablet Take 0.5 tablets (5 mg total) by mouth daily. Patient taking differently: Take 10 mg by mouth daily.  12/15/14  Yes Volanda Napoleon, MD  atorvastatin (LIPITOR) 40 MG tablet Take 40 mg by mouth at bedtime.  03/16/14  Yes [provider]  Cholecalciferol (  VITAMIN D-3) 1000 units CAPS Take 2,000 Units by mouth daily.   Yes [provider]  clopidogrel (PLAVIX) 75 MG tablet Take 75 mg by mouth daily.   Yes [provider]  FLUoxetine (PROZAC) 10 MG capsule Take 1 capsule (10 mg total) by mouth daily. 08/13/16  Yes Ennever, Rudell Cobb, MD  folic acid (FOLVITE) 1 MG tablet TAKE ONE TABLET BY MOUTH ONCE DAILY. (ROUND YELLOW TAB WITH V 31/62) 12/11/16  Yes Ennever, Rudell Cobb, MD  levETIRAcetam (KEPPRA) 500 MG tablet Take 500 mg by mouth 2 (two) times daily.  11/22/15  10/16/17 Yes [provider]  losartan (COZAAR) 50 MG tablet Take 25 mg by mouth 2 (two) times daily.    Yes [provider]  magnesium oxide (MAG-OX) 400 (241.3 Mg) MG tablet Take 400 mg by mouth 2 (two) times daily.   Yes [provider]  metoprolol succinate (TOPROL-XL) 50 MG 24 hr tablet Take 50 mg by mouth daily. Take with or immediately following a meal.    Yes [provider]  pantoprazole (PROTONIX) 40 MG tablet Take 1 tablet (40 mg total) by mouth daily. 01/11/15  Yes Ennever, Rudell Cobb, MD  potassium chloride (KLOR-CON) 20 MEQ packet Take 40 mEq by mouth 2 (two) times daily. Take 2 packets by mouth 2 times daily for the next 3 days.  Then go back to your regular dose of 2 packets once daily. Patient taking differently: Take 40 mEq by mouth 2 (two) times daily.  06/09/17  Yes Charlesetta Shanks, MD    Inpatient Medications: Scheduled Meds: . amLODipine  10 mg Oral Daily  . amoxicillin-clavulanate  1 tablet Oral Q12H  . atorvastatin  40 mg Oral q1800  . clopidogrel  75 mg Oral Daily  . enoxaparin (LOVENOX) injection  80 mg Subcutaneous Q12H  . famotidine  20 mg Oral BID  . FLUoxetine  10 mg Oral Daily  . folic acid  1 mg Oral Daily  . levETIRAcetam  500 mg Oral BID  . loratadine  10 mg Oral Daily  . losartan  50 mg Oral Daily  . magnesium oxide  400 mg Oral BID  . metoCLOPramide  10 mg Oral TID AC  . metoprolol succinate  50 mg Oral Daily  . pantoprazole  40 mg Oral Daily  . spironolactone  50 mg Oral Daily   Continuous Infusions:  PRN Meds: LORazepam  Allergies:   No Known Allergies  Social History: No other information on social history, family history, or review of systems is available based on patient condition. Social History   Socioeconomic History  . Marital status: Single    Spouse name: Not on file  . Number of children: Not on file  . Years of education: Not on file  . Highest education level: Not on file  Occupational History    . Not on file  Social Needs  . Financial resource strain: Not on file  . Food insecurity:    Worry: Not on file    Inability: Not on file  . Transportation needs:    Medical: Not on file    Non-medical: Not on file  Tobacco Use  . Smoking status: Current Every Day Smoker    Packs/day: 0.50    Years: 45.00    Pack years: 22.50    Types: Cigarettes    Start date: 06/05/1968  . Smokeless tobacco: Never Used  . Tobacco comment: 06/18/16 still smoking  Substance and Sexual Activity  . Alcohol  use: No    Alcohol/week: 0.0 oz  . Drug use: No  . Sexual activity: Not on file  Lifestyle  . Physical activity:    Days per week: Not on file    Minutes per session: Not on file  . Stress: Not on file  Relationships  . Social connections:    Talks on phone: Not on file    Gets together: Not on file    Attends religious service: Not on file    Active member of club or organization: Not on file    Attends meetings of clubs or organizations: Not on file    Relationship status: Not on file  . Intimate partner violence:    Fear of current or ex partner: Not on file    Emotionally abused: Not on file    Physically abused: Not on file    Forced sexual activity: Not on file  Other Topics Concern  . Not on file  Social History Narrative  . Not on file    Family History:   Family History  Problem Relation Age of Onset  . GI Bleed Neg Hx   . GI Disease Neg Hx   . Stroke Neg Hx    Family Status:  Family Status  Relation Name Status  . Neg Hx  (Not Specified)    ROS:  Please see the history of present illness.  All other ROS reviewed and negative.     Physical Exam/Data:   Vitals:   10/17/17 0759 10/17/17 0937 10/17/17 0946 10/17/17 1017  BP: (!) 147/92 131/78 133/80 (!) 136/92  Pulse: 60 (!) 116 78 84  Resp: 16     Temp: 98.4 F (36.9 C)     TempSrc: Oral     SpO2: 98%   98%  Weight:      Height:        Intake/Output Summary (Last 24 hours) at 10/17/2017 1401 Last  data filed at 10/17/2017 0600 Gross per 24 hour  Intake 1046 ml  Output 1550 ml  Net -504 ml   Filed Weights   10/14/17 1427 10/14/17 1849  Weight: 175 lb (79.4 kg) 182 lb 1.6 oz (82.6 kg)   Body mass index is 29.39 kg/m.  General:  Well nourished, well developed, in no acute distress HEENT: normal Lymph: no adenopathy Neck: no JVD Endocrine:  No thryomegaly Vascular: No carotid bruits; 4/4 extremity pulses 1-2+, without bruits  Cardiac:  normal S1, S2; RRR; no murmur  Lungs: Rales bases bilaterally, no wheezing, rhonchi Abd: soft, nontender, no hepatomegaly  Ext: no edema Musculoskeletal:  No deformities, BUE and BLE strength normal and equal Skin: warm and dry  Neuro: At baseline Psych: Pleasant affect   EKG:  The EKG was personally reviewed and demonstrates: 10/14/2017, sinus rhythm, heart rate 90, no significant change from 03/2014. 10/16/2017, sinus rhythm, heart rate 60, no acute ischemic changes and long QT is improved Telemetry:  Telemetry was personally reviewed and demonstrates: Sinus rhythm except for a brief episode of coarse atrial fib versus atypical atrial flutter last p.m. and another one that lasted longer this a.m.  Relevant CV Studies:  ECHO: 10/17/2017 - Left ventricle: The cavity size was normal. Wall thickness was   increased in a pattern of mild LVH. Systolic function was normal.   The estimated ejection fraction was in the range of 55% to 60%.   The study is not technically sufficient to allow evaluation of LV   diastolic function. - Mitral valve: There  was mild regurgitation. - Pericardium, extracardiac: A trivial pericardial effusion was   identified.  Impressions:  - Test ended prematurely per patient request Cannot fully evaluate   valvular function.  Laboratory Data:  Chemistry Recent Labs  Lab 10/15/17 0259 10/16/17 0448 10/17/17 0252  NA 142 142 143  K 2.4* 2.5* 2.9*  CL 105 103 104  CO2 27 28 25   GLUCOSE 116* 105* 111*  BUN 14  15 15   CREATININE 1.02* 1.05* 1.15*  CALCIUM 9.1 9.2 9.1  GFRNONAA 56* 54* 48*  GFRAA >60 >60 56*  ANIONGAP 10 11 14     Lab Results  Component Value Date   ALT 11 10/15/2017   AST 15 10/15/2017   ALKPHOS 68 10/15/2017   BILITOT 0.9 10/15/2017   Hematology Recent Labs  Lab 10/14/17 1442 10/15/17 0259  WBC 15.5* 11.6*  RBC 4.29 3.54*  HGB 12.2 9.6*  HCT 36.3 30.6*  MCV 84.6 86.4  MCH 28.4 27.1  MCHC 33.6 31.4  RDW 15.4 15.3  PLT 371 291   TSH: No results found for: TSH   HgbA1c:No results found for: HGBA1C Magnesium:  Magnesium  Date Value Ref Range Status  10/17/2017 1.6 (L) 1.7 - 2.4 mg/dL Final    Comment:    Performed at [facility], [place] [address], [place], [place] [address]  03/27/2015 2.1 1.5 - 2.5 mg/dl Final    Radiology/Studies:  Ct Head Wo Contrast  Result Date: 10/14/2017 CLINICAL DATA:  Seizure EXAM: CT HEAD WITHOUT CONTRAST TECHNIQUE: Contiguous axial images were obtained from the base of the skull through the vertex without intravenous contrast. COMPARISON:  CT head 11/22/2015 FINDINGS: Brain: Extensive chronic ischemic changes. Chronic infarct left occipital lobe unchanged. Chronic infarcts in the basal ganglia and white matter bilaterally unchanged. Chronic ischemia in the pons. Ventricle size normal. Negative for acute infarct, hemorrhage, or mass lesion. Vascular: Negative for hyperdense vessel Skull: Negative Sinuses/Orbits: Negative Other: None IMPRESSION: Extensive chronic ischemic change stable from the prior study. No acute intracranial abnormality. Electronically Signed   By: Franchot Gallo M.D.   On: 10/14/2017 15:16   Dg Chest Port 1 View  Result Date: 10/14/2017 CLINICAL DATA:  Seizure at daycare today, hypoxia. EXAM: PORTABLE CHEST 1 VIEW COMPARISON:  09/04/2017 FINDINGS: Slightly lower lung volumes than on prior. Cardiomegaly is noted with aortic atherosclerosis. Confluent opacities along the medial aspect of the right heart border and  left lung base with vascular congestion and diffuse interstitial edema is seen. No acute fracture nor joint dislocations. IMPRESSION: 1. Low lung volumes with stable cardiomegaly and aortic atherosclerosis. 2. Interstitial edema. 3. Confluent bibasilar opacities raise suspicion for pneumonia and/or atelectasis. Aspiration is not entirely excluded. Electronically Signed   By: Ashley Royalty M.D.   On: 10/14/2017 15:51   Dg Swallowing Func-speech Pathology  Result Date: 10/15/2017 Objective Swallowing Evaluation: Type of Study: MBS-Modified Barium Swallow Study  Patient Details Name: Sheila Young MRN: [id number] Date of Birth: 1951-01-09 Today's Date: 10/15/2017 Time: SLP Start Time (ACUTE ONLY): 0930 -SLP Stop Time (ACUTE ONLY): 0945 SLP Time Calculation (min) (ACUTE ONLY): 15 min Past Medical History: Past Medical History: Diagnosis Date . Anger  . Esophagus cancer ([place]) 04/07/2014 . 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 History of noncompliance with medical treatment  . Hypercholesteremia  . Hypertension  . Hypokalemia  . Mammogram abnormal  . Mechanical dysphagia 04/07/2014 . Radiation   54 Gy to distal esophagus . Seizures ([place])  . Stroke [facility])  Past Surgical History: Past  Surgical History: Procedure Laterality Date . IR REMOVAL TUN ACCESS W/ PORT W/O FL MOD SED  02/18/2017 HPI: Sheila Young  is a 67 y.o. female, w hypertension, esophageal cander with radiation 2015, hyperlipidemia, CVA, Seizures apparently was noted to have a seizure at adult day care and also to  be hypoxic. CT brain extensive chronic ischemic change stable from the prior study. No acute intracranial abnormality. CXR Confluent bibasilar opacities raise suspicion for pneumonia and/or atelectasis. Aspiration is not entirely excluded.  No data recorded Assessment / Plan / Recommendation CHL IP CLINICAL IMPRESSIONS 10/15/2017 Clinical Impression Pt's oral and pharyngeal swallow function was within normal limits druing this study. Sensory, motor, protective  measures and coordination of swallow initiation was appropriate. No penetration or aspiration observed. Evidence of possible esophageal dysphagia with barium pill stopping at GE junction and was not transited into stomach despite multiple sips liquid and heavier textures likely result of radiation s/p esophageal cancer 2015. Recommend regular diet, thin liquids, pills with water or consider crushing if large, follow solids with liquids and upright posture after meals. Will follow up for continued and brief education.  SLP Visit Diagnosis Dysphagia, unspecified (R13.10) Attention and concentration deficit following -- Frontal lobe and executive function deficit following -- Impact on safety and function Mild aspiration risk   CHL IP TREATMENT RECOMMENDATION 10/15/2017 Treatment Recommendations Therapy as outlined in treatment plan below   Prognosis 10/15/2017 Prognosis for Safe Diet Advancement Good Barriers to Reach Goals -- Barriers/Prognosis Comment -- CHL IP DIET RECOMMENDATION 10/15/2017 SLP Diet Recommendations Regular solids;Thin liquid Liquid Administration via Cup;Straw Medication Administration Whole meds with liquid Compensations Slow rate;Small sips/bites Postural Changes Remain semi-upright after after feeds/meals (Comment);Seated upright at 90 degrees   CHL IP OTHER RECOMMENDATIONS 10/15/2017 Recommended Consults -- Oral Care Recommendations Oral care BID Other Recommendations --   CHL IP FOLLOW UP RECOMMENDATIONS 10/15/2017 Follow up Recommendations None   CHL IP FREQUENCY AND DURATION 10/15/2017 Speech Therapy Frequency (ACUTE ONLY) min 1 x/week Treatment Duration 2 weeks      CHL IP ORAL PHASE 10/15/2017 Oral Phase WFL Oral - Pudding Teaspoon -- Oral - Pudding Cup -- Oral - Honey Teaspoon -- Oral - Honey Cup -- Oral - Nectar Teaspoon -- Oral - Nectar Cup -- Oral - Nectar Straw -- Oral - Thin Teaspoon -- Oral - Thin Cup -- Oral - Thin Straw -- Oral - Puree -- Oral - Mech Soft -- Oral - Regular -- Oral -  Multi-Consistency -- Oral - Pill -- Oral Phase - Comment --  CHL IP PHARYNGEAL PHASE 10/15/2017 Pharyngeal Phase WFL Pharyngeal- Pudding Teaspoon -- Pharyngeal -- Pharyngeal- Pudding Cup -- Pharyngeal -- Pharyngeal- Honey Teaspoon -- Pharyngeal -- Pharyngeal- Honey Cup -- Pharyngeal -- Pharyngeal- Nectar Teaspoon -- Pharyngeal -- Pharyngeal- Nectar Cup -- Pharyngeal -- Pharyngeal- Nectar Straw -- Pharyngeal -- Pharyngeal- Thin Teaspoon -- Pharyngeal -- Pharyngeal- Thin Cup -- Pharyngeal -- Pharyngeal- Thin Straw -- Pharyngeal -- Pharyngeal- Puree -- Pharyngeal -- Pharyngeal- Mechanical Soft -- Pharyngeal -- Pharyngeal- Regular -- Pharyngeal -- Pharyngeal- Multi-consistency -- Pharyngeal -- Pharyngeal- Pill -- Pharyngeal -- Pharyngeal Comment --  CHL IP CERVICAL ESOPHAGEAL PHASE 10/15/2017 Cervical Esophageal Phase WFL Pudding Teaspoon -- Pudding Cup -- Honey Teaspoon -- Honey Cup -- Nectar Teaspoon -- Nectar Cup -- Nectar Straw -- Thin Teaspoon -- Thin Cup -- Thin Straw -- Puree -- Mechanical Soft -- Regular -- Multi-consistency -- Pill -- Cervical Esophageal Comment -- No flowsheet data found. [name] 10/15/2017, 11:51 AM  Orbie Pyo [place].Ed CCC-SLP Pager [phone]          Assessment and Plan:   Principal Problem: 1.  Atrial flutter (HCC) - Intermittent and patient is asymptomatic. -Agree with beta-blocker, uptitrated as patient blood pressure and heart rate will tolerate - CHA2DS2-VASc = This patients CHA2DS2-VASc = 5 (age x 1, female x 1, HTN, CVA x 2). - Score and unadjusted Ischemic Stroke Rate (% per year) is equal to 7.2 % stroke rate/year from a score of 5 Above score calculated as 1 point each if present [CHF, HTN, DM, Vascular=MI/PAD/Aortic Plaque, Age if 65-74, or Female], 2 points each if present [Age > 75, or Stroke/TIA/TE] -She has been started on full anticoagulation with Lovenox .-Discuss with MD if she is a good candidate for systemic anticoagulation as she has a  history of a recent GI bleed, diverticular in origin. -TSH has been ordered but no results yet.  2.  Recent hospitalization for lower GI bleed, felt diverticular in origin - She was to follow-up with GI after this recent hospitalization but has not done so yet. - She has a history of esophageal cancer, treated with remote chemotherapy and radiation  Otherwise, per IM Active Problems:   Hypokalemia   Seizure ([place])   Aspiration pneumonia of both lower lobes due to gastric secretions ([place])   Acute hypoxemic respiratory failure ([place])   Hyperaldosteronism ([place])   For questions or updates, please contact [facility] Please consult [email or web address] for contact info under Cardiology/STEMI.   Signed, Rosaria Ferries, PA-C  10/17/2017 2:01 PM   The patient was seen, examined and discussed with Rosaria Ferries, PA-C and I agree with the above.   67 y.o. 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   I have reviewed her telemetry and ECGs, she has had at least two episodes if atrial fibrillation with RVR last for 4 hours. She was asymptomatic at the time, its highly probable that she has more episodes at home. LVEF 55-60%, left atrium has normal size.  She denies any falls at home.  Given CHADS-VASc 5  I would start her on NOAC such as Eliquis,. Monitor Hemoglobin and any signs of bleeding closely, discontinue only if she develops significant bleeding. I would increase metoprolol to 75 mg po daily.  Ena Dawley, MD 10/17/2017

## 2017-10-17 NOTE — Progress Notes (Addendum)
PROGRESS NOTE  Sheila Young MEQ:[id number] DOB: 07/29/1950 DOA: 10/14/2017 PCP: Beckie Salts, MD  Brief Narrative: 85yow PMH seizure disorder tx with Keppra, presented with seziure at adult day care, vomiting and hypoxia. CT head no acute inracranial abnormality. CXR suggested pneumonia vs atelectasis.   Assessment/Plan New onset atrial fibrillation/flutter with rapid ventricular response.  --Completely asymptomatic, may be new, see below.  Was in sinus rhythm on admission.,  Check TSH and echocardiogram.  Start enoxaparin --Medical reconciliation has been updated and they will list Toprol-XL, will start this now, give 1 dose IV metoprolol.  If rate remains uncontrolled, may need to start IV diltiazem infusion. --Not listed on any anticoagulation.  As this may be new, will ask cardiology to further evaluate and make any further recommendations.   -- I have reviewed records in care everywhere, she has a listed diagnosis of "arrhythmia", but I do not see any documentation further specifying the arrhythmia.  She has had at least 2 echocardiograms in the past, one for indication of TIA, other for indication of CHF (see reports in the bottom of this note).  EKG report in care everywhere documented sinus rhythm.  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 --Potassium still under 3.  Aggressive repletion today.  Replete magnesium.  Review of records demonstrates long-standing often severe hypokalemia. --In September 2018 the question of hyperaldosteronism was raised on admission at [facility].  She was seen by endocrinology and laboratory studies were sent which showed normal aldosterone at that time.  She has not yet had follow-up with endocrinology. --Of records as listed above strongly suggest hyperaldosteronism, in fact doubt confirmatory  testing is needed based on literature review.  Suggest outpatient adrenal CT and follow-up with endocrinology --In the meantime will start spironolactone  Aspiration pneumonia with acute hypoxic resp failure. PMH esophageal cancer 2015. --Hypoxia has resolved.  Asymptomatic.  Will change to oral antibiotics, last day 7/14.  Seizure disorder, known, with seizure prior to admission. Thought secondary to missed medications based on history. Neurology rec no EEG, continue Keppra at 500 mg BID, continue seizure precautions --No recurrent seizures.  Continue Keppra.  Essential HTN --Remained stable.  Continue amlodipine, losartan.  Review of med rec shows new medications today.  Resume Toprol-XL.  Chronic normocytic anemia, stable.  Anemia of chronic disease suspected. --Follow-up as an outpatient.  PMH ischemic CVA as reported on discharge summary 12/2016 from [facility]. --Continue Plavix, statin  Prolonged QT --Resolved.  Secondary to electrolyte abnormalities.  PMH stroke  Per [facility] statutes, patients with seizures are not allowed to drive until  they have been seizure-free for six months. Use caution when using heavy equipment or power tools. Avoid working on ladders or at heights. Take showers instead of baths. Ensure the water temperature is not too high on the home water heater. Do not go swimming alone. When caring for infants or small children, sit down when holding, feeding, or changing them to minimize risk of injury to the child in the event you have a seizure.   Also, Maintain good sleep hygiene. Avoid alcohol.  -->Call 911 and bring the patient back to the ED if:  A. The seizure lasts longer than 5 minutes.  B. The patient doesn't awaken shortly after the seizure C. The patient has new problems such as difficulty seeing, speaking or moving D. The patient was injured during the seizure E.  The  patient has a temperature over 102 F (39C) F. The patient vomited and now is having trouble breathing   New work-up for atrial fibrillation/flutter as well as treatment to decrease heart rate.  Not ready for discharge.   DVT prophylaxis: enoxaparin Code Status: full Family Communication: none Disposition Plan: prior living arrangement    Murray Hodgkins, MD  [facility] Direct contact: [phone] --[place] [place]  --[email or web address]; password [email or web address]  7PM-7AM contact night coverage as above 10/17/2017, 9:43 AM  LOS: 3 days   Consultants:    Procedures:    Antimicrobials:  Cefepime 7/9-7/10  Unasyn 7/10 >> 7/12  Augmentin 7/12 >> 7/14  Vancomycin 7/9  Interval history/Subjective: Feels better, no complaints.  Breathing fine.  No chest pain or palpitations.  Objective: Vitals:  Vitals:   10/17/17 0759 10/17/17 0937  BP: (!) 147/92 131/78  Pulse: 60 (!) 116  Resp: 16   Temp: 98.4 F (36.9 C)   SpO2: 98%     Exam: Constitutional:   . Appears calm and comfortable Eyes:  . pupils and irises appear normal . Normal lids  ENMT:  . grossly normal hearing  Respiratory:  . CTA bilaterally, no w/r/r.  . Respiratory effort normal.  Cardiovascular:  . Tachycardic, irregular, no murmur, rub or gallop. . No LE extremity edema   . Telemetry atrial fibrillation/flutter with rapid ventricular response. Skin:  . No rashes, lesions, ulcers Psychiatric:  . Mental status o Mood, affect appropriate . judgment and insight difficult to gauge     I have personally reviewed the following:   Labs:  Phosphorus within normal limits.  Potassium 2.9.  Magnesium 1.6.  Imaging studies:    Medical tests:  EKG independently reviewed: Sinus rhythm, no acute changes.  Test discussed with performing physician:    Decision to obtain old records:    Review and summation of old records:  Echo 12/2016 indication  "CHF" Conclusions Summary Ejection fraction is visually estimated at 55% Overall left ventricular function is preserved at Normal right ventricular size and function. Moderate to severe mitral regurgitation. Moderate tricuspid regurgitation . Normal right ventricular size and function.  Echo 11/2015 indication "TIA" Conclusions Summary Ejection fraction is visually estimated at 65-70% Normal left ventricular size and systolic function with no appreciable segmental abnormality. Normal right ventricular size and function. Mild mitral regurgitation.  Scheduled Meds: . amLODipine  10 mg Oral Daily  . atorvastatin  40 mg Oral q1800  . clopidogrel  75 mg Oral Daily  . enoxaparin (LOVENOX) injection  80 mg Subcutaneous Q12H  . famotidine  20 mg Oral BID  . FLUoxetine  10 mg Oral Daily  . folic acid  1 mg Oral Daily  . levETIRAcetam  500 mg Oral BID  . loratadine  10 mg Oral Daily  . losartan  50 mg Oral Daily  . magnesium oxide  400 mg Oral BID  . metoCLOPramide  10 mg Oral TID AC  . metoprolol succinate  50 mg Oral Daily  . pantoprazole  40 mg Oral Daily  . potassium chloride  40 mEq Oral Q4H  . spironolactone  50 mg Oral Daily   Continuous Infusions: . ampicillin-sulbactam (UNASYN) IV 3 g (10/17/17 0510)    Principal Problem:   Atrial flutter (HCC) Active Problems:   Hypokalemia   Seizure (HCC)   Aspiration pneumonia of both lower lobes due to gastric secretions (HCC)   Acute hypoxemic respiratory failure (HCC)   Hyperaldosteronism (HCC)   LOS: 3 days

## 2017-10-17 NOTE — Progress Notes (Signed)
ANTICOAGULATION CONSULT NOTE - Initial Consult  Pharmacy Consult for enoxaparin Indication: atrial fibrillation  No Known Allergies  Patient Measurements: Height: 5\' 6"  (167.6 cm) Weight: 182 lb 1.6 oz (82.6 kg) IBW/kg (Calculated) : 59.3  Vital Signs: Temp: 98.4 F (36.9 C) (07/12 0759) Temp Source: Oral (07/12 0759) BP: 147/92 (07/12 0759) Pulse Rate: 60 (07/12 0759)  Labs: Recent Labs    10/14/17 1442 10/15/17 0259 10/16/17 0448 10/17/17 0252  HGB 12.2 9.6*  --   --   HCT 36.3 30.6*  --   --   PLT 371 291  --   --   CREATININE 1.20* 1.02* 1.05* 1.15*    Estimated Creatinine Clearance: 52.1 mL/min (A) (by C-G formula based on SCr of 1.15 mg/dL (H)).   Medical History: Past Medical History:  Diagnosis Date  . Anger   . Esophagus cancer ([place]) 04/07/2014  . High aldosterone ([place])   . History of noncompliance with medical treatment   . Hypercholesteremia   . Hypertension   . Hypokalemia   . Mammogram abnormal   . Mechanical dysphagia 04/07/2014  . Radiation    54 Gy to distal esophagus  . Seizures ([place])   . Stroke [facility])     Assessment: 67 yo F presents with witnessed seizure. Now found to have new Afib. Pharmacy consulted to start Lovenox. Hgb 9.6, plts wnl. Last dose of DVT px Lovenox was yesterday at 2100.  Goal of Therapy:  Monitor platelets by anticoagulation protocol: Yes   Plan:  Start enoxaparin 80mg  Greasy Q12h this morning Monitor CBC, s/s of bleed  Valery Chance J 10/17/2017,9:09 AM

## 2017-10-18 DIAGNOSIS — I483 Typical atrial flutter: Secondary | ICD-10-CM

## 2017-10-18 LAB — TSH: TSH: 3.701 u[IU]/mL (ref 0.350–4.500)

## 2017-10-18 LAB — MAGNESIUM: Magnesium: 2.1 mg/dL (ref 1.7–2.4)

## 2017-10-18 LAB — BASIC METABOLIC PANEL
ANION GAP: 9 (ref 5–15)
BUN: 18 mg/dL (ref 8–23)
CO2: 28 mmol/L (ref 22–32)
Calcium: 9.7 mg/dL (ref 8.9–10.3)
Chloride: 106 mmol/L (ref 98–111)
Creatinine, Ser: 1.25 mg/dL — ABNORMAL HIGH (ref 0.44–1.00)
GFR calc Af Amer: 51 mL/min — ABNORMAL LOW (ref >60)
GFR calc non Af Amer: 44 mL/min — ABNORMAL LOW (ref >60)
GLUCOSE: 130 mg/dL — AB (ref 70–99)
POTASSIUM: 3.8 mmol/L (ref 3.5–5.1)
Sodium: 143 mmol/L (ref 135–145)

## 2017-10-18 MED ORDER — APIXABAN 5 MG PO TABS
5.0000 mg | ORAL_TABLET | Freq: Two times a day (BID) | ORAL | Status: DC
  Administered 2017-10-18 – 2017-10-20 (×4): 5 mg via ORAL
  Filled 2017-10-18 (×4): qty 1

## 2017-10-18 NOTE — Progress Notes (Signed)
PROGRESS NOTE  Sheila Young IRS:[id number] DOB: September 08, 1950 DOA: 10/14/2017 PCP: Beckie Salts, MD  Brief Narrative: 75yow PMH seizure disorder tx with Keppra, presented with seziure at adult day care, vomiting and hypoxia. CT head no acute inracranial abnormality. CXR suggested pneumonia vs atelectasis.   Assessment/Plan New onset atrial fibrillation/flutter with rapid ventricular response.  --Remains in sinus rhythm now after conversion on metoprolol.  TSH within normal limits.  Echocardiogram unremarkable. --Unknown whether he has previous history not, only medical record listing was "arrhythmia". --We will continue Toprol-XL at current dose.  Appreciate cardiology evaluation. --Discussed with sister with whom she lives, patient has no formal medical of attorney, however her sister assists her with medical decisions.  We discussed risk/benefit of anticoagulation and the risk/benefit of newer agents versus warfarin.  She elects to begin Eliquis. Will stop Plavix (on for stroke prevention).   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 In September 2018 the question of hyperaldosteronism was raised on admission at [facility].  She was seen by endocrinology and laboratory studies were sent which showed normal aldosterone at that time.  She has not yet had follow-up with endocrinology. Review of records as listed above strongly suggest hyperaldosteronism, in fact doubt confirmatory testing is needed based on literature review.  Suggest outpatient adrenal CT and follow-up with endocrinology --Potassium and magnesium now within normal limits. --Continue spironolactone  Aspiration pneumonia with acute hypoxic resp failure. PMH esophageal cancer 2015. --No hypoxia.  Finishes antibiotics tomorrow.  Doing well.  Seizure disorder, known, with  seizure prior to admission. Thought secondary to missed medications based on history. Neurology rec no EEG, continue Keppra at 500 mg BID, continue seizure precautions --Continue Keppra, no seizures.  Essential HTN --Stable.  Continue Toprol-XL.  Chronic normocytic anemia, stable.  Anemia of chronic disease suspected. --Follow-up as an outpatient.  PMH ischemic CVA as reported on discharge summary 12/2016 from [facility]. --Continue Plavix, statin  Prolonged QT --Resolved.  Secondary to electrolyte abnormalities.  PMH stroke  Per [facility] statutes, patients with seizures are not allowed to drive until  they have been seizure-free for six months. Use caution when using heavy equipment or power tools. Avoid working on ladders or at heights. Take showers instead of baths. Ensure the water temperature is not too high on the home water heater. Do not go swimming alone. When caring for infants or small children, sit down when holding, feeding, or changing them to minimize risk of injury to the child in the event you have a seizure.   Also, Maintain good sleep hygiene. Avoid alcohol.  -->Call 911 and bring the patient back to the ED if:  A. The seizure lasts longer than 5 minutes.  B. The patient doesn't awaken shortly after the seizure C. The patient has new problems such as difficulty seeing, speaking or moving D. The patient was injured during the seizure E. The patient has a temperature over 102 F (39C) F. The patient vomited and now is having trouble breathing   New work-up for atrial fibrillation/flutter as well as treatment to decrease heart rate.  Not ready for discharge.   DVT prophylaxis: Eliquis Code Status: full Family Communication: Discussed in detail the case with Lanney Gins her sister by telephone Disposition Plan: Home with sister tomorrow   Murray Hodgkins,  MD  [facility] Direct contact: [phone] --Via amion app [place]  --[email or web address]; password [email or web address]  7PM-7AM  contact night coverage as above 10/18/2017, 3:42 PM  LOS: 4 days   Consultants:    Procedures:  Echo Study Conclusions  - Left ventricle: The cavity size was normal. Wall thickness was   increased in a pattern of mild LVH. Systolic function was normal.   The estimated ejection fraction was in the range of 55% to 60%.   The study is not technically sufficient to allow evaluation of LV   diastolic function. - Mitral valve: There was mild regurgitation. - Pericardium, extracardiac: A trivial pericardial effusion was   identified.  Impressions:  - Test ended prematurely per patient request Cannot fully evaluate   valvular function.  Antimicrobials:  Cefepime 7/9-7/10  Unasyn 7/10 >> 7/12  Augmentin 7/12 >> 7/14  Vancomycin 7/9  Interval history/Subjective: Very restless last night, up all night pacing, needed tele-sitter. Sleeping today.  Objective: Vitals:  Vitals:   10/18/17 0023 10/18/17 0916  BP: (!) 143/68 (!) 147/84  Pulse: 73 75  Resp:  14  Temp: 99.2 F (37.3 C) 99.4 F (37.4 C)  SpO2: 97% 97%    Exam: Constitutional:   . Appears calm and comfortable Respiratory:  . CTA bilaterally, no w/r/r  . Respiratory effort normal Cardiovascular:  . RRR, no m/r/g . No LE extremity edema   Psychiatric:  . Mental status . Sleepy but wakes up easily to voice and follows simple commands    I have personally reviewed the following:   Labs:  BMP noted, Mg WNL, K+ WNL  Imaging studies:    Medical tests:  EKG independently reviewed: Sinus rhythm, no acute changes.  Test discussed with performing physician:    Decision to obtain old records:    Review and summation of old records:  Echo 12/2016 indication "CHF" Conclusions Summary Ejection fraction is visually estimated at 55% Overall left ventricular function is  preserved at Normal right ventricular size and function. Moderate to severe mitral regurgitation. Moderate tricuspid regurgitation . Normal right ventricular size and function.  Echo 11/2015 indication "TIA" Conclusions Summary Ejection fraction is visually estimated at 65-70% Normal left ventricular size and systolic function with no appreciable segmental abnormality. Normal right ventricular size and function. Mild mitral regurgitation.  Scheduled Meds: . amLODipine  10 mg Oral Daily  . amoxicillin-clavulanate  1 tablet Oral Q12H  . atorvastatin  40 mg Oral q1800  . clopidogrel  75 mg Oral Daily  . enoxaparin (LOVENOX) injection  80 mg Subcutaneous Q12H  . famotidine  20 mg Oral BID  . FLUoxetine  10 mg Oral Daily  . folic acid  1 mg Oral Daily  . levETIRAcetam  500 mg Oral BID  . loratadine  10 mg Oral Daily  . losartan  50 mg Oral Daily  . magnesium oxide  400 mg Oral BID  . metoCLOPramide  10 mg Oral TID AC  . metoprolol succinate  75 mg Oral Daily  . pantoprazole  40 mg Oral Daily  . spironolactone  50 mg Oral Daily   Continuous Infusions:   Principal Problem:   Atrial flutter (HCC) Active Problems:   Hypokalemia   Seizure (HCC)   Aspiration pneumonia of both lower lobes due to gastric secretions (HCC)   Acute hypoxemic respiratory failure (HCC)   Hyperaldosteronism (HCC)   LOS: 4 days

## 2017-10-18 NOTE — Discharge Instructions (Signed)

## 2017-10-18 NOTE — Care Management Note (Addendum)
Case Management Note  Patient Details  Name: Lyrical Sowle MRN: [id number] Date of Birth: 1951/03/11  Subjective/Objective:  From home with daughter,  She goes to [employer] in [place] ([place]- [place] from 8 am to 5 pm.    Please call [place] at [phone] when patient is ready for dc.    7/15 Tomi Bamberger RN, BSN - per Cards patient will need to be on an oral anticoagulant for new onset afib.  NCM awaiting benefit check for eliquis and xarelto.              Action/Plan: DC home when ready.   Expected Discharge Date:                  Expected Discharge Plan:  Home/Self Care  In-House Referral:     Discharge planning Services  CM Consult  Post Acute Care Choice:    Choice offered to:     DME Arranged:    DME Agency:     HH Arranged:    [place] Agency:     Status of Service:  Completed, signed off  If discussed at [employer] of Stay Meetings, dates discussed:    Additional Comments:  Zenon Mayo, RN 10/18/2017, 9:51 AM

## 2017-10-18 NOTE — Progress Notes (Signed)
Progress Note  Patient Name: Sheila Young Date of Encounter: 10/18/2017  Primary Cardiologist:Dr Ena Dawley  Subjective   No complaints today.  Maintaining sinus rhythm.  Inpatient Medications    Scheduled Meds: . amLODipine  10 mg Oral Daily  . amoxicillin-clavulanate  1 tablet Oral Q12H  . atorvastatin  40 mg Oral q1800  . clopidogrel  75 mg Oral Daily  . enoxaparin (LOVENOX) injection  80 mg Subcutaneous Q12H  . famotidine  20 mg Oral BID  . FLUoxetine  10 mg Oral Daily  . folic acid  1 mg Oral Daily  . levETIRAcetam  500 mg Oral BID  . loratadine  10 mg Oral Daily  . losartan  50 mg Oral Daily  . magnesium oxide  400 mg Oral BID  . metoCLOPramide  10 mg Oral TID AC  . metoprolol succinate  75 mg Oral Daily  . pantoprazole  40 mg Oral Daily  . spironolactone  50 mg Oral Daily   Continuous Infusions:  PRN Meds: LORazepam   Vital Signs    Vitals:   10/17/17 1017 10/17/17 1602 10/18/17 0023 10/18/17 0916  BP: (!) 136/92 139/85 (!) 143/68 (!) 147/84  Pulse: 84 83 73 75  Resp:  (!) 22  14  Temp:  98.7 F (37.1 C) 99.2 F (37.3 C) 99.4 F (37.4 C)  TempSrc:  Oral Oral Oral  SpO2: 98% 93% 97% 97%  Weight:      Height:        Intake/Output Summary (Last 24 hours) at 10/18/2017 1211 Last data filed at 10/18/2017 0900 Gross per 24 hour  Intake 100 ml  Output 300 ml  Net -200 ml   Filed Weights   10/14/17 1427 10/14/17 1849  Weight: 175 lb (79.4 kg) 182 lb 1.6 oz (82.6 kg)    Telemetry    Normal sinus rhythm- Personally Reviewed  ECG    None performed today- Personally Reviewed  Physical Exam   GEN: No acute distress.   Neck: No JVD Cardiac: RRR, no murmurs, rubs, or gallops.  Respiratory: Clear to auscultation bilaterally. GI: Soft, nontender, non-distended  MS: No edema; No deformity. Neuro:  Nonfocal  Psych: Normal affect   Labs    Chemistry Recent Labs  Lab 10/14/17 1442 10/15/17 0259 10/16/17 0448 10/17/17 0252  10/18/17 0823  NA 138 142 142 143 143  K 2.7* 2.4* 2.5* 2.9* 3.8  CL 96* 105 103 104 106  CO2 30 27 28 25 28   GLUCOSE 124* 116* 105* 111* 130*  BUN 23 14 15 15 18   CREATININE 1.20* 1.02* 1.05* 1.15* 1.25*  CALCIUM 10.2 9.1 9.2 9.1 9.7  PROT 9.0* 6.4*  --   --   --   ALBUMIN 4.4 3.0*  --   --   --   AST 24 15  --   --   --   ALT 14 11  --   --   --   ALKPHOS 92 68  --   --   --   BILITOT 0.5 0.9  --   --   --   GFRNONAA 46* 56* 54* 48* 44*  GFRAA 53* >60 >60 56* 51*  ANIONGAP 12 10 11 14 9      Hematology Recent Labs  Lab 10/14/17 1442 10/15/17 0259  WBC 15.5* 11.6*  RBC 4.29 3.54*  HGB 12.2 9.6*  HCT 36.3 30.6*  MCV 84.6 86.4  MCH 28.4 27.1  MCHC 33.6 31.4  RDW 15.4 15.3  PLT 371 291    Cardiac EnzymesNo results for input(s): TROPONINI in the last 168 hours. No results for input(s): TROPIPOC in the last 168 hours.   BNPNo results for input(s): BNP, PROBNP in the last 168 hours.   DDimer No results for input(s): DDIMER in the last 168 hours.   Radiology    No results found.  Cardiac Studies   2D echocardiogram (10/17/2017)  Study Conclusions  - Left ventricle: The cavity size was normal. Wall thickness was   increased in a pattern of mild LVH. Systolic function was normal.   The estimated ejection fraction was in the range of 55% to 60%.   The study is not technically sufficient to allow evaluation of LV   diastolic function. - Mitral valve: There was mild regurgitation. - Pericardium, extracardiac: A trivial pericardial effusion was   identified.  Impressions:  - Test ended prematurely per patient request Cannot fully evaluate   valvular function  Patient Profile      Sheila Young is a 66 y.o. 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    Assessment & Plan    1: Proximal atrial fibrillation- maintaining sinus rhythm.  Beta-blocker has been increased.  Recommend starting novel oral anticoagulant.  She does have a history of GI bleed in the past.  Her hemoglobin is 9.6.  Her hemoglobin will need to be carefully monitored as an outpatient.  This patients CHA2DS2-VASc Score and unadjusted Ischemic Stroke Rate (% per year) is equal to 7.2 % stroke rate/year from a score of 5  Above score calculated as 1 point each if present [CHF, HTN, DM, Vascular=MI/PAD/Aortic Plaque, Age if 65-74, or Female] Above score calculated as 2 points each if present [Age > 75, or Stroke/TIA/TE]  [facility] will sign off.   Medication Recommendations: Start novel oral anticoagulant Other recommendations (labs, testing, etc): Follow hemoglobin as an outpatient Follow up as an outpatient: Follow-up with Dr. Meda Coffee as an outpatient.  For questions or updates, please contact [place] Please consult [email or web address] for contact info under Cardiology/STEMI.      Signed, Quay Burow, MD  10/18/2017, 12:11 PM

## 2017-10-18 NOTE — Progress Notes (Signed)
ANTICOAGULATION CONSULT NOTE -   Pharmacy Consult for Transition from Enoxaparin to Apixaban Indication: nonvalvular atrial fibrillation  No Known Allergies  Patient Measurements: Height: 5\' 6"  (167.6 cm) Weight: 182 lb 1.6 oz (82.6 kg) IBW/kg (Calculated) : 59.3  Vital Signs: Temp: 99.4 F (37.4 C) (07/13 0916) Temp Source: Oral (07/13 0916) BP: 147/84 (07/13 0916) Pulse Rate: 75 (07/13 0916)  Labs: Recent Labs    10/16/17 0448 10/17/17 0252 10/18/17 0823  CREATININE 1.05* 1.15* 1.25*    Estimated Creatinine Clearance: 47.9 mL/min (A) (by C-G formula based on SCr of 1.25 mg/dL (H)).   Medical History: Past Medical History:  Diagnosis Date  . Anger   . Esophagus cancer ([place]) 04/07/2014  . 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 History of noncompliance with medical treatment   . Hypercholesteremia   . Hypertension   . Hypokalemia   . Mammogram abnormal   . Mechanical dysphagia 04/07/2014  . Radiation    54 Gy to distal esophagus  . Seizures ([place])   . Stroke [facility])     Assessment: 67 yo F presents with witnessed seizure.  Pharmacy initially consulted 7/12 for Lovenox for new on set Afib. Pharmacy consulted to transition to oral Apixaban for nonvalvular Afib.  Lovenox 80mg  q12h , dose last given at 0918 today 7/13. SCr 1.15> up to 1.25 today, CrCl ~ 47 ml/min Hgb 9.6 on 7/10, h/o chronic anemia. Age 3y.o, wt 82kg, scr 1.25>will need apixaban 5mg  po BID    Goal of Therapy:  Monitor platelets by anticoagulation protocol: Yes   Plan:  Discontinue enoxaparin now. Start Apixaban 5 mg po BID start at 20:00 tonight Monitor for s/s of bleed  Nicole Cella, RPh Clinical Pharmacist Please check AMION for all [place] phone numbers After 10:00 PM, call [place] [phone] 10/18/2017,4:07 PM

## 2017-10-18 NOTE — Plan of Care (Signed)
Pt reportedly ambulatory overnight without difficulty.  Has been in bed resting this Am.  Appetite good.  Anxiety has decreased this Am.

## 2017-10-19 ENCOUNTER — Encounter (HOSPITAL_COMMUNITY): Payer: Self-pay | Admitting: Family Medicine

## 2017-10-19 DIAGNOSIS — D696 Thrombocytopenia, unspecified: Secondary | ICD-10-CM

## 2017-10-19 LAB — CBC
HCT: 36.1 % (ref 36.0–46.0)
Hemoglobin: 11.2 g/dL — ABNORMAL LOW (ref 12.0–15.0)
MCH: 27.1 pg (ref 26.0–34.0)
MCHC: 31 g/dL (ref 30.0–36.0)
MCV: 87.4 fL (ref 78.0–100.0)
PLATELETS: 135 10*3/uL — AB (ref 150–400)
RBC: 4.13 MIL/uL (ref 3.87–5.11)
RDW: 15.6 % — AB (ref 11.5–15.5)
WBC: 8.9 10*3/uL (ref 4.0–10.5)

## 2017-10-19 NOTE — Progress Notes (Signed)
PROGRESS NOTE  Sheila Young DXI:[id number] DOB: July 24, 1950 DOA: 10/14/2017 PCP: Beckie Salts, MD  Brief Narrative: 23yow PMH seizure disorder tx with Keppra, presented with seziure at adult day care, vomiting and hypoxia. CT head no acute inracranial abnormality. CXR suggested pneumonia vs atelectasis.   Assessment/Plan New onset atrial fibrillation/flutter with rapid ventricular response.  --Remains in sinus rhythm.  Will discontinue telemetry.  Continue Toprol-XL at current dose. --Continue apixaban.  Acute thrombocytopenia --Allergy unclear.  Doubt related to Lovenox but will repeat CBC in a.m.  Currently on apixaban.  Follow clinically.  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 In September 2018 the question of hyperaldosteronism was raised on admission at [facility].  She was seen by endocrinology and laboratory studies were sent which showed normal aldosterone at that time.  She has not yet had follow-up with endocrinology. Review of records as listed above strongly suggest hyperaldosteronism, in fact doubt confirmatory testing is needed based on literature review.  Suggest outpatient adrenal CT and follow-up with endocrinology --Continue spironolactone.  Follow-up with endocrinology as an outpatient.  Aspiration pneumonia with acute hypoxic resp failure. PMH esophageal cancer 2015. --Asymptomatic.  Completes antibiotics today.  Seizure disorder, known, with seizure prior to admission. Thought secondary to missed medications based on history. Neurology rec no EEG, continue Keppra at 500 mg BID, continue seizure precautions --No recurrent seizures.  Continue Keppra.  Essential HTN --Remains stable, continue Toprol-XL.  Chronic normocytic anemia, stable.  Anemia of chronic disease suspected. --Follow-up as an  outpatient.  PMH ischemic CVA as reported on discharge summary 12/2016 from [facility]. --Continue clopidogrel, statin  Prolonged QT --Resolved.  Secondary to electrolyte abnormalities.  PMH stroke  Per [facility] statutes, patients with seizures are not allowed to drive until  they have been seizure-free for six months. Use caution when using heavy equipment or power tools. Avoid working on ladders or at heights. Take showers instead of baths. Ensure the water temperature is not too high on the home water heater. Do not go swimming alone. When caring for infants or small children, sit down when holding, feeding, or changing them to minimize risk of injury to the child in the event you have a seizure.   Also, Maintain good sleep hygiene. Avoid alcohol.  -->Call 911 and bring the patient back to the ED if:  A. The seizure lasts longer than 5 minutes.  B. The patient doesn't awaken shortly after the seizure C. The patient has new problems such as difficulty seeing, speaking or moving D. The patient was injured during the seizure E. The patient has a temperature over 102 F (39C) F. The patient vomited and now is having trouble breathing   New work-up for atrial fibrillation/flutter as well as treatment to decrease heart rate.  Not ready for discharge.   DVT prophylaxis: Eliquis Code Status: full Family Communication: Discussed with same sister today by telephone Disposition Plan: Home with sister tomorrow if platelets stable   Murray Hodgkins, MD  [facility] Direct contact: [phone] --Via amion app OR  --[email or web address]; password [email or web address]  7PM-7AM contact night coverage as above 10/19/2017, 2:32 PM  LOS: 5 days   Consultants:    Procedures:  Echo Study Conclusions  - Left ventricle: The cavity size was normal. Wall thickness was   increased in a pattern of mild LVH.  Systolic function was normal.   The estimated ejection fraction was in  the range of 55% to 60%.   The study is not technically sufficient to allow evaluation of LV   diastolic function. - Mitral valve: There was mild regurgitation. - Pericardium, extracardiac: A trivial pericardial effusion was   identified.  Impressions:  - Test ended prematurely per patient request Cannot fully evaluate   valvular function.  Antimicrobials:  Cefepime 7/9-7/10  Unasyn 7/10 >> 7/12  Augmentin 7/12 >> 7/14  Vancomycin 7/9  Interval history/Subjective: Feels fine.  Has no complaints.  Objective: Vitals:  Vitals:   10/19/17 0028 10/19/17 0815  BP: (!) 144/73 133/69  Pulse: 66 60  Resp:  18  Temp: 98.2 F (36.8 C) 98.2 F (36.8 C)  SpO2: 98% 98%    Exam: Constitutional:   . Appears calm and comfortable, sitting in chair eating lunch Respiratory:  . CTA bilaterally, no w/r/r.  . Respiratory effort normal.  Cardiovascular:  . RRR, no m/r/g Psychiatric:  . Mental status o Mood, affect appropriate  I have personally reviewed the following:   Labs:  Platelet count significantly lower from admission.  135.  Imaging studies:    Medical tests:  EKG independently reviewed: Sinus rhythm, no acute changes.  Test discussed with performing physician:    Decision to obtain old records:    Review and summation of old records:  Echo 12/2016 indication "CHF" Conclusions Summary Ejection fraction is visually estimated at 55% Overall left ventricular function is preserved at Normal right ventricular size and function. Moderate to severe mitral regurgitation. Moderate tricuspid regurgitation . Normal right ventricular size and function.  Echo 11/2015 indication "TIA" Conclusions Summary Ejection fraction is visually estimated at 65-70% Normal left ventricular size and systolic function with no appreciable segmental abnormality. Normal right ventricular size and  function. Mild mitral regurgitation.  Scheduled Meds: . amLODipine  10 mg Oral Daily  . amoxicillin-clavulanate  1 tablet Oral Q12H  . apixaban  5 mg Oral BID  . atorvastatin  40 mg Oral q1800  . famotidine  20 mg Oral BID  . FLUoxetine  10 mg Oral Daily  . folic acid  1 mg Oral Daily  . levETIRAcetam  500 mg Oral BID  . loratadine  10 mg Oral Daily  . losartan  50 mg Oral Daily  . magnesium oxide  400 mg Oral BID  . metoCLOPramide  10 mg Oral TID AC  . metoprolol succinate  75 mg Oral Daily  . pantoprazole  40 mg Oral Daily  . spironolactone  50 mg Oral Daily   Continuous Infusions:   Principal Problem:   Atrial flutter (HCC) Active Problems:   Seizure (HCC)   Aspiration pneumonia of both lower lobes due to gastric secretions (HCC)   Acute hypoxemic respiratory failure (HCC)   Hyperaldosteronism (HCC)   Thrombocytopenia (HCC)   LOS: 5 days

## 2017-10-20 LAB — CBC
HEMATOCRIT: 34.6 % — AB (ref 36.0–46.0)
Hemoglobin: 10.8 g/dL — ABNORMAL LOW (ref 12.0–15.0)
MCH: 27.5 pg (ref 26.0–34.0)
MCHC: 31.2 g/dL (ref 30.0–36.0)
MCV: 88 fL (ref 78.0–100.0)
Platelets: 309 10*3/uL (ref 150–400)
RBC: 3.93 MIL/uL (ref 3.87–5.11)
RDW: 15.4 % (ref 11.5–15.5)
WBC: 11.9 10*3/uL — ABNORMAL HIGH (ref 4.0–10.5)

## 2017-10-20 LAB — CULTURE, BLOOD (ROUTINE X 2)
Culture: NO GROWTH
Culture: NO GROWTH
SPECIAL REQUESTS: ADEQUATE

## 2017-10-20 MED ORDER — METOPROLOL SUCCINATE ER 25 MG PO TB24: 75.0000 mg | ORAL_TABLET | Freq: Every day | ORAL | 0 refills | Status: DC

## 2017-10-20 MED ORDER — APIXABAN 5 MG PO TABS: 5.0000 mg | ORAL_TABLET | Freq: Two times a day (BID) | ORAL | 0 refills | Status: DC

## 2017-10-20 MED ORDER — METOPROLOL SUCCINATE ER 100 MG PO TB24: 100.0000 mg | ORAL_TABLET | Freq: Every day | ORAL | Status: DC

## 2017-10-20 MED ORDER — METOPROLOL SUCCINATE ER 50 MG PO TB24
75.0000 mg | ORAL_TABLET | Freq: Every day | ORAL | Status: DC
  Administered 2017-10-20: 75 mg via ORAL
  Filled 2017-10-20 (×2): qty 1

## 2017-10-20 MED ORDER — POTASSIUM CHLORIDE 20 MEQ PO PACK: 40.0000 meq | PACK | Freq: Every day | ORAL | Status: DC

## 2017-10-20 MED ORDER — SPIRONOLACTONE 50 MG PO TABS: 50.0000 mg | ORAL_TABLET | Freq: Every day | ORAL | 0 refills | Status: DC

## 2017-10-20 NOTE — Progress Notes (Signed)
Pt seen by MD, orders written for d/c.  Went over discharge instructions with pt and sister Enid Derry, answered all questions.  Removed IV, no complications.  Escorted for discharge via wheelchair with all belongings.  Will follow up outpatient with MD.

## 2017-10-20 NOTE — Progress Notes (Signed)
Progress Note  Patient Name: Sheila Young Date of Encounter: 10/20/2017  Primary Cardiologist:Dr Ena Dawley  Subjective   No palpitations   Inpatient Medications    Scheduled Meds: . amLODipine  10 mg Oral Daily  . apixaban  5 mg Oral BID  . atorvastatin  40 mg Oral q1800  . famotidine  20 mg Oral BID  . FLUoxetine  10 mg Oral Daily  . folic acid  1 mg Oral Daily  . levETIRAcetam  500 mg Oral BID  . loratadine  10 mg Oral Daily  . losartan  50 mg Oral Daily  . magnesium oxide  400 mg Oral BID  . metoCLOPramide  10 mg Oral TID AC  . metoprolol succinate  100 mg Oral Daily  . pantoprazole  40 mg Oral Daily  . spironolactone  50 mg Oral Daily   Continuous Infusions:  PRN Meds: LORazepam   Vital Signs    Vitals:   10/19/17 0815 10/19/17 1630 10/19/17 2333 10/20/17 0816  BP: 133/69 112/76 130/63 115/69  Pulse: 60 70 65 61  Resp: 18 20  16   Temp: 98.2 F (36.8 C) 98.3 F (36.8 C) 97.9 F (36.6 C) 98.3 F (36.8 C)  TempSrc: Oral Oral Oral Oral  SpO2: 98% 98% 99% 99%  Weight:      Height:        Intake/Output Summary (Last 24 hours) at 10/20/2017 5465 Last data filed at 10/19/2017 0900 Gross per 24 hour  Intake -  Output 800 ml  Net -800 ml   Filed Weights   10/14/17 1427 10/14/17 1849  Weight: 175 lb (79.4 kg) 182 lb 1.6 oz (82.6 kg)    Telemetry    Cannot tell on telemetry if NSR or flutter rates 100   ECG    None performed today- Personally Reviewed  Physical Exam   Affect appropriate Healthy:  appears stated age HEENT: normal Neck supple with no adenopathy JVP normal no bruits no thyromegaly Lungs clear with no wheezing and good diaphragmatic motion Heart:  S1/S2 no murmur, no rub, gallop or click PMI normal Abdomen: benighn, BS positve, no tenderness, no AAA no bruit.  No HSM or HJR Distal pulses intact with no bruits No edema Right sided weakness  Skin warm and dry No muscular weakness   Labs    Chemistry Recent Labs    Lab 10/14/17 1442 10/15/17 0259 10/16/17 0448 10/17/17 0252 10/18/17 0823  NA 138 142 142 143 143  K 2.7* 2.4* 2.5* 2.9* 3.8  CL 96* 105 103 104 106  CO2 30 27 28 25 28   GLUCOSE 124* 116* 105* 111* 130*  BUN 23 14 15 15 18   CREATININE 1.20* 1.02* 1.05* 1.15* 1.25*  CALCIUM 10.2 9.1 9.2 9.1 9.7  PROT 9.0* 6.4*  --   --   --   ALBUMIN 4.4 3.0*  --   --   --   AST 24 15  --   --   --   ALT 14 11  --   --   --   ALKPHOS 92 68  --   --   --   BILITOT 0.5 0.9  --   --   --   GFRNONAA 46* 56* 54* 48* 44*  GFRAA 53* >60 >60 56* 51*  ANIONGAP 12 10 11 14 9      Hematology Recent Labs  Lab 10/15/17 0259 10/19/17 0805 10/20/17 0231  WBC 11.6* 8.9 11.9*  RBC 3.54* 4.13 3.93  HGB  9.6* 11.2* 10.8*  HCT 30.6* 36.1 34.6*  MCV 86.4 87.4 88.0  MCH 27.1 27.1 27.5  MCHC 31.4 31.0 31.2  RDW 15.3 15.6* 15.4  PLT 291 135* 309      Radiology    No results found.  Cardiac Studies   2D echocardiogram (10/17/2017)  Study Conclusions  - Left ventricle: The cavity size was normal. Wall thickness was   increased in a pattern of mild LVH. Systolic function was normal.   The estimated ejection fraction was in the range of 55% to 60%.   The study is not technically sufficient to allow evaluation of LV   diastolic function. - Mitral valve: There was mild regurgitation. - Pericardium, extracardiac: A trivial pericardial effusion was   identified.  Impressions:  - Test ended prematurely per patient request Cannot fully evaluate   valvular function  Patient Profile      Laraya Pestka is a 67 y.o. 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    Assessment & Plan    1: Proximal  atrial fibrillation- maintaining sinus rhythm.  Beta-blocker has been increased.  Recommend starting novel oral anticoagulant.  She does have a history of GI bleed in the past.  Her hemoglobin is 9.6.  Her hemoglobin will need to be carefully monitored as an outpatient.  This patients CHA2DS2-VASc Score and unadjusted Ischemic Stroke Rate (% per year) is equal to 7.2 % stroke rate/year from a score of 5  Above score calculated as 1 point each if present [CHF, HTN, DM, Vascular=MI/PAD/Aortic Plaque, Age if 65-74, or Female] Above score calculated as 2 points each if present [Age > 75, or Stroke/TIA/TE]  [facility] will sign off.   Medication Recommendations: Start novel oral anticoagulant Other recommendations (labs, testing, etc): Follow hemoglobin as an outpatient Follow up as an outpatient: Follow-up with Dr. Meda Coffee as an outpatient.  For questions or updates, please contact [place] Please consult [email or web address] for contact info under Cardiology/STEMI.      Signed, Jenkins Rouge, MD  10/20/2017, 8:23 AM

## 2017-10-20 NOTE — Progress Notes (Signed)
#    2.  S/W JULIE @ [place] # [phone]    1.ELIQUIS   2.5 MG BID  COVER- YES  CO-PAY- $ 24.30  TIER- 2 DRUG  PRIOR APPROVAL- NO   2. ELIQUIS 5 MG BID  COVER- YES  CO-PAY- $ 24.30   TIER- 2 DRUG  PRIOR APPROVAL- NO    3. XARELTO 15 MG BID  COVER- YES  CO-PAY- $ 24.30  TIER- 2 DRUG  PRIOR APPROVAL- NO    4 XARELTO 20 MG DAILY  COVER- YES  CO-PAY- $ 24.30  TIER- 2 DRUG  PRIOR APPROVAL- NO    PREFERRED PHARMACY : YES  [facility], [facility]

## 2017-10-20 NOTE — Discharge Summary (Addendum)
Physician Discharge Summary  Sheila Young KYH:[id number] DOB: [birth year] DOA: 10/14/2017  PCP: Sheila Salts, MD  Admit date: 10/14/2017 Discharge date: 10/20/2017  Recommendations for Outpatient Follow-up:   New onset atrial fibrillation/flutter with rapid ventricular response. Toprol-XL increased. --Continue apixaban.  Suspected diagnosis of hyperaldosteronism. --Continue spironolactone.  Decrease potassium, consider follow-up with endocrinology as an outpatient.  Suggest repeat BMP on follow-up to assess potassium on spironolactone, decreased supplementation.  Seizure disorder, known, with seizure prior to admission. Thought secondary to missed medications based on history. Neurology rec no EEG, continue Keppra at 500 mg BID, continue seizure precautions --No recurrent seizures.  Continue Keppra.  Per [facility] statutes, patients with seizures are not allowed to drive until they have been seizure-free for six months. Use caution when using heavy equipment or power tools. Avoid working on ladders or at heights. Take showers instead of baths. Ensure the water temperature is not too high on the home water heater. Do not go swimming alone. When caring for infants or small children, sit down when holding, feeding, or changing them to minimize risk of injury to the child in the event you have a seizure.   Also, Maintain good sleep hygiene. Avoid alcohol.  -->Call 911 and bring the patient back to the ED if:  A. The seizure lasts longer than 5 minutes.  B. The patient doesn't awaken shortly after the seizure C. The patient has new problems such as difficulty seeing, speaking or moving D. The patient was injured during the seizure E. The patient has a temperature over 102 F (39C) F. The patient vomited and now is having trouble breathing  Follow-up Information    Sheila Salts, MD Follow up in  2 week(s).   Specialty:  Internal Medicine Contact information: [address] [facility] [address] [phone]        Sheila Spark, MD Follow up in 4 week(s).   Specialty:  Cardiology Contact information: [place] [id number] [phone]            Discharge Diagnoses:  1. Seizure disorder, known 2. Aspiration pneumonia with acute hypoxic resp failure 3. New onset atrial fibrillation/flutter with rapid ventricular response. Acute thrombocytopenia 4. 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 Essential HTN 6. Chronic normocytic anemia   Discharge Condition: improved Disposition: home with sister  Diet recommendation: regular  Filed Weights   10/14/17 1427 10/14/17 1849  Weight: 79.4 kg (175 lb) 82.6 kg (182 lb 1.6 oz)    History of present illness:  67yow PMH seizure disorder tx with Keppra, presented with seziure at adult day care, vomiting and hypoxia. CT head no acute inracranial abnormality. CXR suggested pneumonia vs atelectasis.  Hospital Course:  Patient was evaluated by neurology, recommendation to continue current seizure medications.  Patient had no further seizures and neurology signed off.  Further details below.  She was also treated for aspiration pneumonia with acute hypoxic respiratory failure.  She completed antibiotics.  She was noted to have profound and recurrent hypokalemia, on review of records this was concerning for hyperaldosterone anemia.  She was started on spironolactone with improvement.  Also developed atrial fibrillation with rapid ventricular response, likely paroxysmal in nature, arrhythmia as listed in the past history although there are no details on this.  She was seen by cardiology with recommendation for oral anticoagulation.  At this point she appears stable for discharge.  Individual issues as below.  Updated sister Ms.  Sheila Young on discharge, meds and recommendations by telephone.  New onset atrial fibrillation/flutter with rapid ventricular response.  --Remains in sinus rhythm.  Will discontinue telemetry.  Continue Toprol-XL at current dose. --Continue apixaban.  Acute thrombocytopenia --Allergy unclear.  Doubt related to Lovenox but will repeat CBC in a.m.  Currently on apixaban.  Follow clinically.  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 In September 2018 the question of hyperaldosteronism was raised on admission at [facility].  She was seen by endocrinology and laboratory studies were sent which showed normal aldosterone at that time.  She has not yet had follow-up with endocrinology. Review of records as listed above strongly suggest hyperaldosteronism, in fact doubt confirmatory testing is needed based on literature review.  Suggest outpatient adrenal CT and follow-up with endocrinology --Continue spironolactone.  Follow-up with endocrinology as an outpatient.  Aspiration pneumonia with acute hypoxic resp failure. PMH esophageal cancer 2015. --Asymptomatic.  Completes antibiotics today.  Seizure disorder, known, with seizure prior to admission. Thought secondary to missed medications based on history. Neurology rec no EEG, continue Keppra at 500 mg BID, continue seizure precautions --No recurrent seizures.  Continue Keppra.  Essential HTN --Remains stable, continue Toprol-XL.  Chronic normocytic anemia, stable.  Anemia of chronic disease suspected. --Follow-up as an outpatient.  PMH ischemic CVA as reported on discharge summary 12/2016 from [facility] --Continue clopidogrel, statin  Prolonged QT --Resolved.  Secondary to electrolyte abnormalities.  PMH stroke  Per [facility] statutes, patients with seizures are  not allowed to drive until they have been seizure-free for six months. Use caution when using heavy equipment or power tools. Avoid working on ladders or at heights. Take showers instead of baths. Ensure the water temperature is not too high on the home water heater. Do not go swimming alone. When caring for infants or small children, sit down when holding, feeding, or changing them to minimize risk of injury to the child in the event you have a seizure.   Also, Maintain good sleep hygiene. Avoid alcohol.  -->Call 911 and bring the patient back to the ED if:  A. The seizure lasts longer than 5 minutes.  B. The patient doesn't awaken shortly after the seizure C. The patient has new problems such as difficulty seeing, speaking or moving D. The patient was injured during the seizure E. The patient has a temperature over 102 F (39C) F. The patient vomited and now is having trouble breathing  Consultants:  Cardiology   Procedures:  Echo Study Conclusions  - Left ventricle: The cavity size was normal. Wall thickness was increased in a pattern of mild LVH. Systolic function was normal. The estimated ejection fraction was in the range of 55% to 60%. The study is not technically sufficient to allow evaluation of LV diastolic function. - Mitral valve: There was mild regurgitation. - Pericardium, extracardiac: A trivial pericardial effusion was identified.  Impressions:  - Test ended prematurely per patient request Cannot fully evaluate valvular function.  Antimicrobials:  Cefepime 7/9-7/10  Unasyn 7/10 >> 7/12  Augmentin 7/12 >> 7/14  Vancomycin 7/9  Today's assessment: S: feels fine, no complaints. O: Vitals:  Vitals:   10/19/17 2333 10/20/17 0816  BP: 130/63 115/69  Pulse: 65 61  Resp:  16  Temp: 97.9 F (36.6 C) 98.3 F (36.8 C)  SpO2: 99% 99%     Constitutional:  . Appears calm and comfortable Respiratory:  .  CTA bilaterally, no w/r/r.  . Respiratory effort normal. Cardiovascular:  . RRR, no m/r/g Not on telemetry Musculoskeletal:  . RUE, LUE, RLE, LLE   o strength and tone normal, no atrophy, no abnormal movements.  Excellent strength 5/5 in all extremities.  Very strong; symmetric. Neurologic:  . CN grossly intact. . No upper extremity dysdiadochokinesis. Psychiatric:  . Mental status o Mood, affect appropriate    Discharge Instructions  Discharge Instructions    Activity as tolerated - No restrictions   Complete by:  As directed    Diet general   Complete by:  As directed    Discharge instructions   Complete by:  As directed    Call your physician or seek immediate medical attention for seizure, shortness of breath, vomiting or worsening of condition.     Allergies as of 10/20/2017   No Known Allergies     Medication List    STOP taking these medications   clopidogrel 75 MG tablet Commonly known as:  PLAVIX     TAKE these medications   amLODipine 10 MG tablet Commonly known as:  NORVASC Take 0.5 tablets (5 mg total) by mouth daily. What changed:  how much to take   apixaban 5 MG Tabs tablet Commonly known as:  ELIQUIS Take 1 tablet (5 mg total) by mouth 2 (two) times daily.   atorvastatin 40 MG tablet Commonly known as:  LIPITOR Take 40 mg by mouth at bedtime.   FLUoxetine 10 MG capsule Commonly known as:  PROZAC Take 1 capsule (10 mg total) by mouth daily.   folic acid 1 MG tablet Commonly known as:  FOLVITE TAKE ONE TABLET BY MOUTH ONCE DAILY. (ROUND YELLOW TAB WITH V 31/62)   levETIRAcetam 500 MG tablet Commonly known as:  KEPPRA Take 500 mg by mouth 2 (two) times daily.   losartan 50 MG tablet Commonly known as:  COZAAR Take 25 mg by mouth 2 (two) times daily.   magnesium oxide 400 (241.3 Mg) MG tablet Commonly known as:  MAG-OX Take 400 mg by mouth 2 (two) times daily.    metoprolol succinate 25 MG 24 hr tablet Commonly known as:  TOPROL-XL Take 3 tablets (75 mg total) by mouth daily. Take with or immediately following a meal. Start taking on:  10/21/2017 What changed:    medication strength  how much to take   pantoprazole 40 MG tablet Commonly known as:  PROTONIX Take 1 tablet (40 mg total) by mouth daily.   potassium chloride 20 MEQ packet Commonly known as:  KLOR-CON Take 40 mEq by mouth daily. What changed:    when to take this  additional instructions   spironolactone 50 MG tablet Commonly known as:  ALDACTONE Take 1 tablet (50 mg total) by mouth daily. Start taking on:  10/21/2017   Vitamin D-3 1000 units Caps Take 2,000 Units by mouth daily.      No Known Allergies  The results of significant diagnostics from this hospitalization (including imaging, microbiology, ancillary and laboratory) are listed below for reference.    Significant Diagnostic Studies: Ct Head Wo Contrast  Result Date: 10/14/2017 CLINICAL DATA:  Seizure EXAM: CT HEAD WITHOUT CONTRAST TECHNIQUE: Contiguous axial images were obtained from the base of the skull through the vertex without intravenous contrast. COMPARISON:  CT head 11/22/2015 FINDINGS: Brain: Extensive chronic ischemic changes. Chronic infarct left occipital lobe unchanged. Chronic infarcts in the basal ganglia and white matter bilaterally unchanged. Chronic ischemia in the pons. Ventricle size normal. Negative  for acute infarct, hemorrhage, or mass lesion. Vascular: Negative for hyperdense vessel Skull: Negative Sinuses/Orbits: Negative Other: None IMPRESSION: Extensive chronic ischemic change stable from the prior study. No acute intracranial abnormality. Electronically Signed   By: Franchot Gallo M.D.   On: 10/14/2017 15:16   Dg Chest Port 1 View  Result Date: 10/14/2017 CLINICAL DATA:  Seizure at daycare today, hypoxia. EXAM: PORTABLE CHEST 1 VIEW COMPARISON:  09/04/2017 FINDINGS: Slightly lower lung  volumes than on prior. Cardiomegaly is noted with aortic atherosclerosis. Confluent opacities along the medial aspect of the right heart border and left lung base with vascular congestion and diffuse interstitial edema is seen. No acute fracture nor joint dislocations. IMPRESSION: 1. Low lung volumes with stable cardiomegaly and aortic atherosclerosis. 2. Interstitial edema. 3. Confluent bibasilar opacities raise suspicion for pneumonia and/or atelectasis. Aspiration is not entirely excluded. Electronically Signed   By: Ashley Royalty M.D.   On: 10/14/2017 15:51   Dg Swallowing Func-speech Pathology  Result Date: 10/15/2017 Objective Swallowing Evaluation: Type of Study: MBS-Modified Barium Swallow Study  Patient Details Name: Sheila Young MRN: [id number] Date of Birth: 08-27-1950 Today's Date: 10/15/2017 Time: SLP Start Time (ACUTE ONLY): 0930 -SLP Stop Time (ACUTE ONLY): 0945 SLP Time Calculation (min) (ACUTE ONLY): 15 min Past Medical History: Past Medical History: Diagnosis Date . Anger  . Esophagus cancer ([place]) 04/07/2014 . High aldosterone ([place])  . History of noncompliance with medical treatment  . Hypercholesteremia  . Hypertension  . Hypokalemia  . Mammogram abnormal  . Mechanical dysphagia 04/07/2014 . Radiation   54 Gy to distal esophagus . Seizures ([place])  . Stroke [facility])  Past Surgical History: Past Surgical History: Procedure Laterality Date . IR REMOVAL TUN ACCESS W/ PORT W/O FL MOD SED  02/18/2017 HPI: Sheila Young  is a 67 y.o. female, w hypertension, esophageal cander with radiation 2015, hyperlipidemia, CVA, Seizures apparently was noted to have a seizure at adult day care and also to  be hypoxic. CT brain extensive chronic ischemic change stable from the prior study. No acute intracranial abnormality. CXR Confluent bibasilar opacities raise suspicion for pneumonia and/or atelectasis. Aspiration is not entirely excluded.  No data recorded Assessment / Plan / Recommendation CHL IP CLINICAL  IMPRESSIONS 10/15/2017 Clinical Impression Pt's oral and pharyngeal swallow function was within normal limits druing this study. Sensory, motor, protective measures and coordination of swallow initiation was appropriate. No penetration or aspiration observed. Evidence of possible esophageal dysphagia with barium pill stopping at GE junction and was not transited into stomach despite multiple sips liquid and heavier textures likely result of radiation s/p esophageal cancer 2015. Recommend regular diet, thin liquids, pills with water or consider crushing if large, follow solids with liquids and upright posture after meals. Will follow up for continued and brief education.  SLP Visit Diagnosis Dysphagia, unspecified (R13.10) Attention and concentration deficit following -- Frontal lobe and executive function deficit following -- Impact on safety and function Mild aspiration risk   CHL IP TREATMENT RECOMMENDATION 10/15/2017 Treatment Recommendations Therapy as outlined in treatment plan below   Prognosis 10/15/2017 Prognosis for Safe Diet Advancement Good Barriers to Reach Goals -- Barriers/Prognosis Young -- CHL IP DIET RECOMMENDATION 10/15/2017 SLP Diet Recommendations Regular solids;Thin liquid Liquid Administration via Cup;Straw Medication Administration Whole meds with liquid Compensations Slow rate;Small sips/bites Postural Changes Remain semi-upright after after feeds/meals (Young);Seated upright at 90 degrees   CHL IP OTHER RECOMMENDATIONS 10/15/2017 Recommended Consults -- Oral Care Recommendations Oral care BID Other Recommendations --   CHL IP  FOLLOW UP RECOMMENDATIONS 10/15/2017 Follow up Recommendations None   CHL IP FREQUENCY AND DURATION 10/15/2017 Speech Therapy Frequency (ACUTE ONLY) min 1 x/week Treatment Duration 2 weeks      CHL IP ORAL PHASE 10/15/2017 Oral Phase WFL Oral - Pudding Teaspoon -- Oral - Pudding Cup -- Oral - Honey Teaspoon -- Oral - Honey Cup -- Oral - Nectar Teaspoon -- Oral - Nectar Cup  -- Oral - Nectar Straw -- Oral - Thin Teaspoon -- Oral - Thin Cup -- Oral - Thin Straw -- Oral - Puree -- Oral - Mech Soft -- Oral - Regular -- Oral - Multi-Consistency -- Oral - Pill -- Oral Phase - Young --  CHL IP PHARYNGEAL PHASE 10/15/2017 Pharyngeal Phase WFL Pharyngeal- Pudding Teaspoon -- Pharyngeal -- Pharyngeal- Pudding Cup -- Pharyngeal -- Pharyngeal- Honey Teaspoon -- Pharyngeal -- Pharyngeal- Honey Cup -- Pharyngeal -- Pharyngeal- Nectar Teaspoon -- Pharyngeal -- Pharyngeal- Nectar Cup -- Pharyngeal -- Pharyngeal- Nectar Straw -- Pharyngeal -- Pharyngeal- Thin Teaspoon -- Pharyngeal -- Pharyngeal- Thin Cup -- Pharyngeal -- Pharyngeal- Thin Straw -- Pharyngeal -- Pharyngeal- Puree -- Pharyngeal -- Pharyngeal- Mechanical Soft -- Pharyngeal -- Pharyngeal- Regular -- Pharyngeal -- Pharyngeal- Multi-consistency -- Pharyngeal -- Pharyngeal- Pill -- Pharyngeal -- Pharyngeal Young --  CHL IP CERVICAL ESOPHAGEAL PHASE 10/15/2017 Cervical Esophageal Phase WFL Pudding Teaspoon -- Pudding Cup -- Honey Teaspoon -- Honey Cup -- Nectar Teaspoon -- Nectar Cup -- Nectar Straw -- Thin Teaspoon -- Thin Cup -- Thin Straw -- Puree -- Mechanical Soft -- Regular -- Multi-consistency -- Pill -- Cervical Esophageal Young -- No flowsheet data found. Sheila Young 10/15/2017, 11:51 AM      Sheila Young.Ed [employer] [phone]          Microbiology: Recent Results (from the past 240 hour(s))  Respiratory Panel by PCR     Status: None   Collection Time: 10/14/17  7:26 PM  Result Value Ref Range Status   Adenovirus NOT DETECTED NOT DETECTED Final   Coronavirus 229E NOT DETECTED NOT DETECTED Final   Coronavirus HKU1 NOT DETECTED NOT DETECTED Final   Coronavirus NL63 NOT DETECTED NOT DETECTED Final   Coronavirus OC43 NOT DETECTED NOT DETECTED Final   Metapneumovirus NOT DETECTED NOT DETECTED Final   Rhinovirus / Enterovirus NOT DETECTED NOT DETECTED Final   Influenza A NOT DETECTED NOT DETECTED  Final   Influenza B NOT DETECTED NOT DETECTED Final   Parainfluenza Virus 1 NOT DETECTED NOT DETECTED Final   Parainfluenza Virus 2 NOT DETECTED NOT DETECTED Final   Parainfluenza Virus 3 NOT DETECTED NOT DETECTED Final   Parainfluenza Virus 4 NOT DETECTED NOT DETECTED Final   Respiratory Syncytial Virus NOT DETECTED NOT DETECTED Final   Bordetella pertussis NOT DETECTED NOT DETECTED Final   Chlamydophila pneumoniae NOT DETECTED NOT DETECTED Final   Mycoplasma pneumoniae NOT DETECTED NOT DETECTED Final    Young: Performed at [facility], [place] [address], [place], [place] [address]  Culture, blood (routine x 2) Call MD if unable to obtain prior to antibiotics being given     Status: None (Preliminary result)   Collection Time: 10/14/17  8:07 PM  Result Value Ref Range Status   Specimen Description BLOOD LEFT HAND  Final   Special Requests   Final    BOTTLES DRAWN AEROBIC ONLY Blood Culture results may not be optimal due to an inadequate volume of blood received in culture bottles   Culture   Final  NO GROWTH 4 DAYS Performed at [facility], [place] [address] [place], [place] [address]    Report Status PENDING  Incomplete  Culture, blood (routine x 2) Call MD if unable to obtain prior to antibiotics being given     Status: None (Preliminary result)   Collection Time: 10/14/17  8:15 PM  Result Value Ref Range Status   Specimen Description BLOOD RIGHT HAND  Final   Special Requests   Final    BOTTLES DRAWN AEROBIC ONLY Blood Culture adequate volume   Culture   Final    NO GROWTH 4 DAYS Performed at [facility], [place] [address] [place], [place] [address]    Report Status PENDING  Incomplete     Labs: Basic Metabolic Panel: Recent Labs  Lab 10/14/17 1442 10/15/17 0259 10/16/17 0448 10/17/17 0252 10/17/17 0805 10/18/17 0823  NA 138 142 142 143  --  143  K 2.7* 2.4* 2.5* 2.9*  --  3.8  CL 96* 105 103 104  --  106  CO2 30 27 28 25   --  28  GLUCOSE 124*  116* 105* 111*  --  130*  BUN 23 14 15 15   --  18  CREATININE 1.20* 1.02* 1.05* 1.15*  --  1.25*  CALCIUM 10.2 9.1 9.2 9.1  --  9.7  MG 1.8  --  1.7 1.6*  --  2.1  PHOS  --   --   --   --  2.5  --    Liver Function Tests: Recent Labs  Lab 10/14/17 1442 10/15/17 0259  AST 24 15  ALT 14 11  ALKPHOS 92 68  BILITOT 0.5 0.9  PROT 9.0* 6.4*  ALBUMIN 4.4 3.0*   CBC: Recent Labs  Lab 10/14/17 1442 10/15/17 0259 10/19/17 0805 10/20/17 0231  WBC 15.5* 11.6* 8.9 11.9*  HGB 12.2 9.6* 11.2* 10.8*  HCT 36.3 30.6* 36.1 34.6*  MCV 84.6 86.4 87.4 88.0  PLT 371 291 135* 309    Principal Problem:   Atrial flutter (HCC) Active Problems:   Seizure (HCC)   Aspiration pneumonia of both lower lobes due to gastric secretions (HCC)   Acute hypoxemic respiratory failure (HCC)   Hyperaldosteronism (HCC)   Thrombocytopenia (HCC)   Time coordinating discharge: 35 minutes  Signed:  Murray Hodgkins, MD [facility] 10/20/2017, 1:16 PM

## 2018-07-21 ENCOUNTER — Encounter (HOSPITAL_BASED_OUTPATIENT_CLINIC_OR_DEPARTMENT_OTHER): Payer: Self-pay

## 2018-07-21 ENCOUNTER — Emergency Department (HOSPITAL_BASED_OUTPATIENT_CLINIC_OR_DEPARTMENT_OTHER): Payer: Medicare Other

## 2018-07-21 ENCOUNTER — Emergency Department (HOSPITAL_BASED_OUTPATIENT_CLINIC_OR_DEPARTMENT_OTHER)
Admission: EM | Admit: 2018-07-21 | Discharge: 2018-07-22 | Disposition: A | Discharge status: Home / Self Care | Payer: Medicare Other | Attending: Emergency Medicine | Admitting: Emergency Medicine

## 2018-07-21 ENCOUNTER — Other Ambulatory Visit: Payer: Self-pay

## 2018-07-21 DIAGNOSIS — F17210 Nicotine dependence, cigarettes, uncomplicated: Secondary | ICD-10-CM | POA: Diagnosis not present

## 2018-07-21 DIAGNOSIS — R410 Disorientation, unspecified: Secondary | ICD-10-CM | POA: Diagnosis not present

## 2018-07-21 DIAGNOSIS — Z79899 Other long term (current) drug therapy: Secondary | ICD-10-CM | POA: Diagnosis not present

## 2018-07-21 DIAGNOSIS — R569 Unspecified convulsions: Secondary | ICD-10-CM | POA: Diagnosis not present

## 2018-07-21 DIAGNOSIS — I10 Essential (primary) hypertension: Secondary | ICD-10-CM | POA: Diagnosis not present

## 2018-07-21 DIAGNOSIS — Z7901 Long term (current) use of anticoagulants: Secondary | ICD-10-CM | POA: Diagnosis not present

## 2018-07-21 DIAGNOSIS — N289 Disorder of kidney and ureter, unspecified: Secondary | ICD-10-CM | POA: Insufficient documentation

## 2018-07-21 DIAGNOSIS — R4182 Altered mental status, unspecified: Secondary | ICD-10-CM | POA: Diagnosis present

## 2018-07-21 DIAGNOSIS — Z8501 Personal history of malignant neoplasm of esophagus: Secondary | ICD-10-CM | POA: Insufficient documentation

## 2018-07-21 LAB — CBC WITH DIFFERENTIAL/PLATELET
Abs Immature Granulocytes: 0.07 10*3/uL (ref 0.00–0.07)
Basophils Absolute: 0.1 10*3/uL (ref 0.0–0.1)
Basophils Relative: 0 %
Eosinophils Absolute: 0 10*3/uL (ref 0.0–0.5)
Eosinophils Relative: 0 %
HCT: 38.2 % (ref 36.0–46.0)
Hemoglobin: 12 g/dL (ref 12.0–15.0)
Immature Granulocytes: 1 %
Lymphocytes Relative: 10 %
Lymphs Abs: 1.5 10*3/uL (ref 0.7–4.0)
MCH: 27.7 pg (ref 26.0–34.0)
MCHC: 31.4 g/dL (ref 30.0–36.0)
MCV: 88.2 fL (ref 80.0–100.0)
Monocytes Absolute: 0.8 10*3/uL (ref 0.1–1.0)
Monocytes Relative: 5 %
Neutro Abs: 12.2 10*3/uL — ABNORMAL HIGH (ref 1.7–7.7)
Neutrophils Relative %: 84 %
Platelets: 224 10*3/uL (ref 150–400)
RBC: 4.33 MIL/uL (ref 3.87–5.11)
RDW: 16.7 % — ABNORMAL HIGH (ref 11.5–15.5)
WBC: 14.6 10*3/uL — ABNORMAL HIGH (ref 4.0–10.5)
nRBC: 0 % (ref 0.0–0.2)

## 2018-07-21 LAB — COMPREHENSIVE METABOLIC PANEL
ALT: 23 U/L (ref 0–44)
AST: 23 U/L (ref 15–41)
Albumin: 4 g/dL (ref 3.5–5.0)
Alkaline Phosphatase: 87 U/L (ref 38–126)
Anion gap: 10 (ref 5–15)
BUN: 30 mg/dL — ABNORMAL HIGH (ref 8–23)
CO2: 21 mmol/L — ABNORMAL LOW (ref 22–32)
Calcium: 9.6 mg/dL (ref 8.9–10.3)
Chloride: 102 mmol/L (ref 98–111)
Creatinine, Ser: 1.51 mg/dL — ABNORMAL HIGH (ref 0.44–1.00)
GFR calc Af Amer: 41 mL/min — ABNORMAL LOW (ref >60)
GFR calc non Af Amer: 35 mL/min — ABNORMAL LOW (ref >60)
Glucose, Bld: 136 mg/dL — ABNORMAL HIGH (ref 70–99)
Potassium: 4 mmol/L (ref 3.5–5.1)
Sodium: 133 mmol/L — ABNORMAL LOW (ref 135–145)
Total Bilirubin: 0.7 mg/dL (ref 0.3–1.2)
Total Protein: 8.3 g/dL — ABNORMAL HIGH (ref 6.5–8.1)

## 2018-07-21 LAB — URINALYSIS, ROUTINE W REFLEX MICROSCOPIC
Bilirubin Urine: NEGATIVE
Glucose, UA: NEGATIVE mg/dL
Ketones, ur: NEGATIVE mg/dL
Leukocytes,Ua: NEGATIVE
Nitrite: NEGATIVE
Protein, ur: NEGATIVE mg/dL
Specific Gravity, Urine: 1.02 (ref 1.005–1.030)
pH: 5.5 (ref 5.0–8.0)

## 2018-07-21 LAB — URINALYSIS, MICROSCOPIC (REFLEX): WBC, UA: NONE SEEN WBC/hpf (ref 0–5)

## 2018-07-21 LAB — CBG MONITORING, ED: Glucose-Capillary: 124 mg/dL — ABNORMAL HIGH (ref 70–99)

## 2018-07-21 LAB — MAGNESIUM: Magnesium: 1.8 mg/dL (ref 1.7–2.4)

## 2018-07-21 MED ORDER — LORAZEPAM 2 MG/ML IJ SOLN
2.0000 mg | Freq: Once | INTRAMUSCULAR | Status: AC
  Administered 2018-07-21: 2 mg via INTRAVENOUS

## 2018-07-21 MED ORDER — LORAZEPAM 2 MG/ML IJ SOLN
INTRAMUSCULAR | Status: DC
Start: 2018-07-21 — End: 2018-07-22
  Filled 2018-07-21: qty 1

## 2018-07-21 MED ORDER — LEVETIRACETAM IN NACL 1000 MG/100ML IV SOLN
1000.0000 mg | Freq: Once | INTRAVENOUS | Status: AC
  Administered 2018-07-21: 1000 mg via INTRAVENOUS
  Filled 2018-07-21: qty 100

## 2018-07-21 MED ORDER — SODIUM CHLORIDE 0.9 % IV SOLN
INTRAVENOUS | Status: DC | PRN
  Administered 2018-07-21: 250 mL via INTRAVENOUS

## 2018-07-21 NOTE — ED Notes (Signed)
Attempted IV in left hand unsuccessful.

## 2018-07-21 NOTE — ED Triage Notes (Addendum)
Per pt's sister with pt and sister on phone that pt lives with- pt "was delirious, walking around the house picking at things that weren't there, just walking around and stare at ya"-pt with behavior started ~6pm-states pt slept all day and got up at 4pm pt was not her normal self-was not eating, smoking and unlike her to sleep that long unless she has a seizure-pt looking around the house trying to clean that wasn't there--last normal self at midnight-pt alert to name and DOB, age,place-steady gait

## 2018-07-21 NOTE — ED Provider Notes (Addendum)
[facility] Provider Note: Georgena Spurling, MD, FACEP  CSN: [id number] MRN: [id number] ARRIVAL: 07/21/18 at 2239 ROOM: [facility]  Altered Mental Status  Level 5 caveat: Altered mental status HISTORY OF PRESENT ILLNESS  07/21/18 10:55 PM Sheila Young is a 68 y.o. female who was brought in by her sister for altered mental status.  She spent the day sleeping and did not eat or smoke which is not usual for her.  About 6 PM her sister noticed that she was confused.  She was picking at things that were not there.  She was last seen normal about midnight.  Her sister is not aware of her having a fever, cough, shortness of breath or any other acute physical symptoms apart from increased sleepiness and decreased appetite as noted above.  She did not take any of her usual medications today.  Her sister has noted no focal neurologic deficits.   Past Medical History:  Diagnosis Date  . Anger   . Esophagus cancer ([place]) 04/07/2014  . 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 History of noncompliance with medical treatment   . Hyperaldosteronism ([place]) 10/17/2017  . Hypercholesteremia   . Hypertension   . Hypokalemia   . Mammogram abnormal   . Mechanical dysphagia 04/07/2014  . Radiation    54 Gy to distal esophagus  . Seizures ([place])   . Stroke [facility])     Past Surgical History:  Procedure Laterality Date  . IR REMOVAL TUN ACCESS W/ PORT W/O FL MOD SED  02/18/2017    Family History  Problem Relation Age of Onset  . GI Bleed Neg Hx   . GI Disease Neg Hx   . Stroke Neg Hx     Social History   Tobacco Use  . Smoking status: Current Every Day Smoker    Packs/day: 0.50    Years: 45.00    Pack years: 22.50    Types: Cigarettes    Start date: 06/05/1968  . Smokeless tobacco: Never Used  Substance Use Topics  . Alcohol use: No    Alcohol/week: 0.0 standard drinks  . Drug use: No    Prior to Admission medications   Medication Sig Start Date End Date Taking?  Authorizing Provider  amLODipine (NORVASC) 10 MG tablet Take 0.5 tablets (5 mg total) by mouth daily. Patient taking differently: Take 10 mg by mouth daily.  12/15/14   Volanda Napoleon, MD  apixaban (ELIQUIS) 5 MG TABS tablet Take 1 tablet (5 mg total) by mouth 2 (two) times daily. 10/20/17   Samuella Cota, MD  atorvastatin (LIPITOR) 40 MG tablet Take 40 mg by mouth at bedtime.  03/16/14   [provider]  Cholecalciferol (VITAMIN D-3) 1000 units CAPS Take 2,000 Units by mouth daily.    [provider]  FLUoxetine (PROZAC) 10 MG capsule Take 1 capsule (10 mg total) by mouth daily. 08/13/16   Volanda Napoleon, MD  folic acid (FOLVITE) 1 MG tablet TAKE ONE TABLET BY MOUTH ONCE DAILY. (ROUND YELLOW TAB WITH V 31/62) 12/11/16   Volanda Napoleon, MD  levETIRAcetam (KEPPRA) 500 MG tablet Take 500 mg by mouth 2 (two) times daily.  11/22/15 10/16/17  [provider]  losartan (COZAAR) 50 MG tablet Take 25 mg by mouth 2 (two) times daily.     [provider]  magnesium oxide (MAG-OX) 400 (241.3 Mg) MG tablet Take 400 mg by mouth 2 (two) times daily.    [provider]  metoprolol succinate (TOPROL-XL) 25 MG 24 hr tablet Take 3 tablets (75 mg total) by mouth daily. Take with or immediately following a meal. 10/21/17   Samuella Cota, MD  pantoprazole (PROTONIX) 40 MG tablet Take 1 tablet (40 mg total) by mouth daily. 01/11/15   Volanda Napoleon, MD  potassium chloride (KLOR-CON) 20 MEQ packet Take 40 mEq by mouth daily. 10/20/17   Samuella Cota, MD  spironolactone (ALDACTONE) 50 MG tablet Take 1 tablet (50 mg total) by mouth daily. 10/21/17   Samuella Cota, MD    Allergies Patient has no known allergies.   REVIEW OF SYSTEMS     PHYSICAL EXAMINATION  Initial Vital Signs Blood pressure (!) 158/76, pulse 86, temperature 99.5 F (37.5 C), temperature source Oral, resp. rate 18, SpO2 97 %.  Examination General: Well-developed, well-nourished female  in no acute distress; appearance consistent with age of record HENT: normocephalic; atraumatic Eyes: pupils equal, round and reactive to light; extraocular muscles intact Neck: supple Heart: regular rate and rhythm Lungs: clear to auscultation bilaterally Abdomen: soft; nondistended; nontender; bowel sounds present Extremities: No deformity; full range of motion; pulses normal Neurologic: Awake, alert and oriented to person, place, day of week and year but not month or POTUS; motor function intact in all extremities and symmetric; sensation intact and symmetric with appropriate discernment of sharp versus dull stimuli; no facial droop Skin: Warm and dry Psychiatric: Flat affect   RESULTS  Summary of this visit's results, reviewed by myself:   EKG Interpretation  Date/Time:  Tuesday July 21 2018 23:04:51 EDT Ventricular Rate:  82 PR Interval:    QRS Duration: 98 QT Interval:  432 QTC Calculation: 505 R Axis:   37 Text Interpretation:  Sinus rhythm Probable left atrial enlargement Borderline repolarization abnormality Prolonged QT interval Previously AFIB with PVCs Confirmed by Shanon Rosser [phone]) on 07/21/2018 11:23:23 PM      Laboratory Studies: Results for orders placed or performed during the hospital encounter of 07/21/18 (from the past 24 hour(s))  POC CBG, ED     Status: Abnormal   Collection Time: 07/21/18 10:58 PM  Result Value Ref Range   Glucose-Capillary 124 (H) 70 - 99 mg/dL  CBC with Differential/Platelet     Status: Abnormal   Collection Time: 07/21/18 11:17 PM  Result Value Ref Range   WBC 14.6 (H) 4.0 - 10.5 K/uL   RBC 4.33 3.87 - 5.11 MIL/uL   Hemoglobin 12.0 12.0 - 15.0 g/dL   HCT 38.2 36.0 - 46.0 %   MCV 88.2 80.0 - 100.0 fL   MCH 27.7 26.0 - 34.0 pg   MCHC 31.4 30.0 - 36.0 g/dL   RDW 16.7 (H) 11.5 - 15.5 %   Platelets 224 150 - 400 K/uL   nRBC 0.0 0.0 - 0.2 %   Neutrophils Relative % 84 %   Neutro Abs 12.2 (H) 1.7 - 7.7 K/uL   Lymphocytes Relative  10 %   Lymphs Abs 1.5 0.7 - 4.0 K/uL   Monocytes Relative 5 %   Monocytes Absolute 0.8 0.1 - 1.0 K/uL   Eosinophils Relative 0 %   Eosinophils Absolute 0.0 0.0 - 0.5 K/uL   Basophils Relative 0 %   Basophils Absolute 0.1 0.0 - 0.1 K/uL   Immature Granulocytes 1 %   Abs Immature Granulocytes 0.07 0.00 - 0.07 K/uL  Comprehensive metabolic panel     Status: Abnormal   Collection Time: 07/21/18 11:17 PM  Result Value Ref Range  Sodium 133 (L) 135 - 145 mmol/L   Potassium 4.0 3.5 - 5.1 mmol/L   Chloride 102 98 - 111 mmol/L   CO2 21 (L) 22 - 32 mmol/L   Glucose, Bld 136 (H) 70 - 99 mg/dL   BUN 30 (H) 8 - 23 mg/dL   Creatinine, Ser 1.51 (H) 0.44 - 1.00 mg/dL   Calcium 9.6 8.9 - 10.3 mg/dL   Total Protein 8.3 (H) 6.5 - 8.1 g/dL   Albumin 4.0 3.5 - 5.0 g/dL   AST 23 15 - 41 U/L   ALT 23 0 - 44 U/L   Alkaline Phosphatase 87 38 - 126 U/L   Total Bilirubin 0.7 0.3 - 1.2 mg/dL   GFR calc non Af Amer 35 (L) >60 mL/min   GFR calc Af Amer 41 (L) >60 mL/min   Anion gap 10 5 - 15  Magnesium     Status: None   Collection Time: 07/21/18 11:17 PM  Result Value Ref Range   Magnesium 1.8 1.7 - 2.4 mg/dL  Urinalysis, Routine w reflex microscopic     Status: Abnormal   Collection Time: 07/21/18 11:26 PM  Result Value Ref Range   Color, Urine YELLOW YELLOW   APPearance CLEAR CLEAR   Specific Gravity, Urine 1.020 1.005 - 1.030   pH 5.5 5.0 - 8.0   Glucose, UA NEGATIVE NEGATIVE mg/dL   Hgb urine dipstick TRACE (A) NEGATIVE   Bilirubin Urine NEGATIVE NEGATIVE   Ketones, ur NEGATIVE NEGATIVE mg/dL   Protein, ur NEGATIVE NEGATIVE mg/dL   Nitrite NEGATIVE NEGATIVE   Leukocytes,Ua NEGATIVE NEGATIVE  Urinalysis, Microscopic (reflex)     Status: Abnormal   Collection Time: 07/21/18 11:26 PM  Result Value Ref Range   RBC / HPF 0-5 0 - 5 RBC/hpf   WBC, UA NONE SEEN 0 - 5 WBC/hpf   Bacteria, UA RARE (A) NONE SEEN   Squamous Epithelial / LPF 0-5 0 - 5   Imaging Studies: Dg Chest 2 View  Result  Date: 07/22/2018 CLINICAL DATA:  Altered mental status and recent seizure activity EXAM: CHEST - 2 VIEW COMPARISON:  05/08/2018 FINDINGS: Cardiac shadow is mildly enlarged but stable. The lungs are well aerated bilaterally. Chronic interstitial changes are noted bilaterally. Mild left basilar atelectasis is noted. IMPRESSION: Mild left basilar atelectasis. Electronically Signed   By: Inez Catalina M.D.   On: 07/22/2018 01:01   Ct Head Wo Contrast  Result Date: 07/22/2018 CLINICAL DATA:  Seizure activity EXAM: CT HEAD WITHOUT CONTRAST TECHNIQUE: Contiguous axial images were obtained from the base of the skull through the vertex without intravenous contrast. COMPARISON:  05/08/2018 FINDINGS: Brain: Mild atrophic changes are noted. Changes of prior infarct in the left occipital lobe are again noted and stable. Scattered lacunar infarcts are again seen bilaterally stable from the prior exam. No acute hemorrhage, acute infarction or space-occupying mass lesion are noted. Vascular: No hyperdense vessel or unexpected calcification. Skull: Normal. Negative for fracture or focal lesion. Sinuses/Orbits: No acute finding. Other: None. IMPRESSION: Chronic atrophic and ischemic changes without acute abnormality. Electronically Signed   By: Inez Catalina M.D.   On: 07/22/2018 01:09    ED COURSE and MDM  Nursing notes and initial vitals signs, including pulse oximetry, reviewed.  Vitals:   07/22/18 0114 07/22/18 0206 07/22/18 0207 07/22/18 0208  BP: (!) 146/74   (!) 148/80  Pulse: 78 77 83   Resp: 19 15 (!) 21   Temp:      TempSrc:  SpO2: 98% 99% 100%    11:19 PM Patient had a brief (less than 1 minute) right-sided focal seizure witnessed by nursing staff.  She had not taken her Keppra today.  Patient given Ativan and Keppra IV.  2:18 AM Patient sleeping but readily awakened.  Patient and sister advised of diagnostic studies including worsening kidney function.  She was advised to follow-up with her PCP  regarding this.  I do not see any indication for admission at this time.  Symptoms are likely due to a viral illness but she should return if symptoms are worsening or changing.  PROCEDURES    ED DIAGNOSES     ICD-10-CM   1. Confusion R41.0   2. Renal insufficiency N28.9   3. Seizure ([place]) R56.9        Ajai Harville, MD 07/22/18 0220    Shanon Rosser, MD 07/22/18 0221

## 2018-07-21 NOTE — ED Notes (Signed)
Patient transported to CT 

## 2018-07-21 NOTE — ED Notes (Signed)
Pt had 30 second focal seizure twitching to R eye and R arm. Pt confused, agitated immediately after but redirectable.

## 2018-07-22 MED ORDER — SODIUM CHLORIDE 0.9 % IV BOLUS
1000.0000 mL | Freq: Once | INTRAVENOUS | Status: AC
  Administered 2018-07-22: 1000 mL via INTRAVENOUS

## 2018-07-22 NOTE — ED Notes (Signed)
Returned from radiology, remains postictal - drowsy and no verbal response to questions. Updated sister at bedside. Will continue to monitor.

## 2018-07-22 NOTE — ED Notes (Signed)
Pt lethargic, but easily roused. Assisted in dressing patient. Discharge instructions reviewed with sister at bedside by Dr. Florina Ou.

## 2018-11-23 ENCOUNTER — Other Ambulatory Visit: Payer: Self-pay | Admitting: Family

## 2018-11-23 DIAGNOSIS — C154 Malignant neoplasm of middle third of esophagus: Secondary | ICD-10-CM

## 2018-11-23 DIAGNOSIS — D649 Anemia, unspecified: Secondary | ICD-10-CM

## 2018-11-24 ENCOUNTER — Other Ambulatory Visit: Payer: Self-pay

## 2018-11-24 ENCOUNTER — Inpatient Hospital Stay (HOSPITAL_BASED_OUTPATIENT_CLINIC_OR_DEPARTMENT_OTHER): Payer: Medicare Other | Admitting: Family

## 2018-11-24 ENCOUNTER — Telehealth: Payer: Self-pay | Admitting: Hematology & Oncology

## 2018-11-24 ENCOUNTER — Inpatient Hospital Stay: Payer: Medicare Other | Attending: Hematology & Oncology

## 2018-11-24 VITALS — BP 129/64 | HR 69 | Temp 97.1°F | Resp 18

## 2018-11-24 DIAGNOSIS — Z9221 Personal history of antineoplastic chemotherapy: Secondary | ICD-10-CM | POA: Diagnosis not present

## 2018-11-24 DIAGNOSIS — D649 Anemia, unspecified: Secondary | ICD-10-CM

## 2018-11-24 DIAGNOSIS — Z8501 Personal history of malignant neoplasm of esophagus: Secondary | ICD-10-CM | POA: Diagnosis present

## 2018-11-24 DIAGNOSIS — Z923 Personal history of irradiation: Secondary | ICD-10-CM | POA: Insufficient documentation

## 2018-11-24 DIAGNOSIS — R1310 Dysphagia, unspecified: Secondary | ICD-10-CM | POA: Insufficient documentation

## 2018-11-24 DIAGNOSIS — D500 Iron deficiency anemia secondary to blood loss (chronic): Secondary | ICD-10-CM | POA: Diagnosis not present

## 2018-11-24 DIAGNOSIS — C154 Malignant neoplasm of middle third of esophagus: Secondary | ICD-10-CM

## 2018-11-24 DIAGNOSIS — Z7901 Long term (current) use of anticoagulants: Secondary | ICD-10-CM | POA: Insufficient documentation

## 2018-11-24 DIAGNOSIS — C159 Malignant neoplasm of esophagus, unspecified: Secondary | ICD-10-CM

## 2018-11-24 DIAGNOSIS — Z79899 Other long term (current) drug therapy: Secondary | ICD-10-CM | POA: Insufficient documentation

## 2018-11-24 DIAGNOSIS — R1319 Other dysphagia: Secondary | ICD-10-CM

## 2018-11-24 LAB — CMP (CANCER CENTER ONLY)
ALT: 10 U/L (ref 0–44)
AST: 13 U/L — ABNORMAL LOW (ref 15–41)
Albumin: 3.9 g/dL (ref 3.5–5.0)
Alkaline Phosphatase: 70 U/L (ref 38–126)
Anion gap: 9 (ref 5–15)
BUN: 30 mg/dL — ABNORMAL HIGH (ref 8–23)
CO2: 27 mmol/L (ref 22–32)
Calcium: 9.8 mg/dL (ref 8.9–10.3)
Chloride: 108 mmol/L (ref 98–111)
Creatinine: 1.85 mg/dL — ABNORMAL HIGH (ref 0.44–1.00)
GFR, Est AFR Am: 32 mL/min — ABNORMAL LOW (ref >60)
GFR, Estimated: 28 mL/min — ABNORMAL LOW (ref >60)
Glucose, Bld: 105 mg/dL — ABNORMAL HIGH (ref 70–99)
Potassium: 4.6 mmol/L (ref 3.5–5.1)
Sodium: 144 mmol/L (ref 135–145)
Total Bilirubin: 0.4 mg/dL (ref 0.3–1.2)
Total Protein: 6.8 g/dL (ref 6.5–8.1)

## 2018-11-24 LAB — CBC WITH DIFFERENTIAL (CANCER CENTER ONLY)
Abs Immature Granulocytes: 0.05 10*3/uL (ref 0.00–0.07)
Basophils Absolute: 0.1 10*3/uL (ref 0.0–0.1)
Basophils Relative: 1 %
Eosinophils Absolute: 0.3 10*3/uL (ref 0.0–0.5)
Eosinophils Relative: 2 %
HCT: 36.3 % (ref 36.0–46.0)
Hemoglobin: 11.3 g/dL — ABNORMAL LOW (ref 12.0–15.0)
Immature Granulocytes: 1 %
Lymphocytes Relative: 20 %
Lymphs Abs: 2.1 10*3/uL (ref 0.7–4.0)
MCH: 29 pg (ref 26.0–34.0)
MCHC: 31.1 g/dL (ref 30.0–36.0)
MCV: 93.3 fL (ref 80.0–100.0)
Monocytes Absolute: 0.8 10*3/uL (ref 0.1–1.0)
Monocytes Relative: 8 %
Neutro Abs: 7.2 10*3/uL (ref 1.7–7.7)
Neutrophils Relative %: 68 %
Platelet Count: 243 10*3/uL (ref 150–400)
RBC: 3.89 MIL/uL (ref 3.87–5.11)
RDW: 14.9 % (ref 11.5–15.5)
WBC Count: 10.4 10*3/uL (ref 4.0–10.5)
nRBC: 0 % (ref 0.0–0.2)

## 2018-11-24 NOTE — Progress Notes (Signed)
Hematology and Oncology Follow Up Visit  Sheila Young [id number] Dec 29, 1950 68 y.o. 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 She is doing well but states that she is still having trouble swallowing. Her barium swallow study last year for the same complaint was normal. She has not seen GI recently so we will refer her back for possible endoscopy for further evaluation. Her sister states that her previous GI doctor has retired so we will refer her across the hall to [place].  No episodes of bleeding, no bruising or petechiae.  No fever, chills, n/v, cough, rash, dizziness, SOB, chest pain, palpitations, abdominal pain or changes in bowel or bladder habits.  No swelling, tenderness, numbness or tingling in her extremities.  She has maintained a good appetite and is staying well hydrated. Her weight is stable.   ECOG Performance Status: 1 - Symptomatic but completely ambulatory  Medications:  Allergies as of 11/24/2018   No Known Allergies     Medication List       Accurate as of November 24, 2018  1:39 PM. If you have any questions, ask your nurse or doctor.        amLODipine 10 MG tablet Commonly known as: NORVASC Take 0.5 tablets (5 mg total) by mouth daily. What changed: how much to take   apixaban 5 MG Tabs tablet Commonly known as: ELIQUIS Take 1 tablet (5 mg total) by mouth 2 (two) times daily.   atorvastatin 40 MG tablet Commonly known as: LIPITOR Take 40 mg by mouth at bedtime.   FLUoxetine 10 MG capsule Commonly known as: PROZAC Take 1 capsule (10 mg total) by mouth daily.   folic acid 1 MG tablet Commonly known as: FOLVITE TAKE ONE TABLET BY MOUTH ONCE DAILY. (ROUND YELLOW TAB WITH V 31/62)   levETIRAcetam 500 MG  tablet Commonly known as: KEPPRA Take 500 mg by mouth 2 (two) times daily.   losartan 50 MG tablet Commonly known as: COZAAR Take 25 mg by mouth 2 (two) times daily.   magnesium oxide 400 (241.3 Mg) MG tablet Commonly known as: MAG-OX Take 400 mg by mouth 2 (two) times daily.   metoprolol succinate 25 MG 24 hr tablet Commonly known as: TOPROL-XL Take 3 tablets (75 mg total) by mouth daily. Take with or immediately following a meal.   pantoprazole 40 MG tablet Commonly known as: PROTONIX Take 1 tablet (40 mg total) by mouth daily.   potassium chloride 20 MEQ packet Commonly known as: KLOR-CON Take 40 mEq by mouth daily.   spironolactone 50 MG tablet Commonly known as: ALDACTONE Take 1 tablet (50 mg total) by mouth daily.   Vitamin D-3 25 MCG (1000 UT) Caps Take 2,000 Units by mouth daily.       Allergies: No Known Allergies  Past Medical History, Surgical history, Social history, and Family History were reviewed and updated.  Review of Systems: All other 10 point review of systems is negative.   Physical Exam:  vitals were not taken for this visit.   Wt Readings from Last 3 Encounters:  10/14/17 182 lb 1.6 oz (82.6 kg)  09/03/17 171 lb 11.8 oz (77.9 kg)  06/11/17 162 lb 8 oz (73.7 kg)    Ocular: Sclerae unicteric, pupils equal, round and reactive to light  Ear-nose-throat: Oropharynx clear, dentition fair Lymphatic: No cervical or supraclavicular adenopathy Lungs no rales or rhonchi, good excursion bilaterally Heart regular rate and rhythm, no murmur appreciated Abd soft, nontender, positive bowel sounds, no liver or spleen tip palpated on exam, no fluid wave  MSK no focal spinal tenderness, no joint edema Neuro: non-focal, well-oriented, appropriate affect Breasts: Deferred   Lab Results  Component Value Date   WBC 10.4 11/24/2018   HGB 11.3 (L) 11/24/2018   HCT 36.3 11/24/2018   MCV 93.3 11/24/2018   PLT 243 11/24/2018   Lab Results  Component  Value Date   FERRITIN 417 (H) 02/04/2017   IRON 61 02/04/2017   TIBC 278 02/04/2017   UIBC 217 02/04/2017   IRONPCTSAT 22 02/04/2017   Lab Results  Component Value Date   RETICCTPCT 1.1 04/07/2014   RBC 3.89 11/24/2018   RETICCTABS 43.3 04/07/2014   No results found for: KPAFRELGTCHN, LAMBDASER, KAPLAMBRATIO No results found for: IGGSERUM, IGA, IGMSERUM No results found for: Kathrynn Ducking, MSPIKE, SPEI   Chemistry      Component Value Date/Time   NA 133 (L) 07/21/2018 2317   NA 142 02/04/2017 1358   NA 143 06/18/2016 1118   K 4.0 07/21/2018 2317   K 3.9 02/04/2017 1358   K 2.9 (LL) 06/18/2016 1118   CL 102 07/21/2018 2317   CL 103 02/04/2017 1358   CL 99 07/24/2015 0933   CO2 21 (L) 07/21/2018 2317   CO2 27 02/04/2017 1358   CO2 29 06/18/2016 1118   BUN 30 (H) 07/21/2018 2317   BUN 22 02/04/2017 1358   BUN 23.4 06/18/2016 1118   CREATININE 1.51 (H) 07/21/2018 2317   CREATININE 1.60 (H) 06/11/2017 1457   CREATININE 1.19 (H) 02/04/2017 1358   CREATININE 1.1 06/18/2016 1118      Component Value Date/Time   CALCIUM 9.6 07/21/2018 2317   CALCIUM 10.4 (H) 02/04/2017 1358   CALCIUM 9.7 06/18/2016 1118   ALKPHOS 87 07/21/2018 2317   ALKPHOS 166 (H) 02/04/2017 1358   ALKPHOS 115 06/18/2016 1118   AST 23 07/21/2018 2317   AST 19 06/11/2017 1457   AST 17 06/18/2016 1118   ALT 23 07/21/2018 2317   ALT 16 06/11/2017 1457   ALT 14 06/18/2016 1118   BILITOT 0.7 07/21/2018 2317   BILITOT 0.7 06/11/2017 1457   BILITOT 0.44 06/18/2016 1118       Impression and Plan: Sheila Young is a very pleasant 68 yo African American female with history of locally advanced squamous cell carcinoma of the esophagus. She completed treatment with radiation and concurrent chemo in February 2016. We will get her in with [facility] for further work up for cause of difficulty swallowing.  We will go ahead and plan to see her back in another year.  She  will contact our office with nay questions or concerns. We can certainly see her sooner if needed.   Laverna Peace, NP 8/18/20201:39 PM

## 2018-11-24 NOTE — Telephone Encounter (Signed)
Appointments scheduled LMVM w/ date/time.  GI referral has been placed and [facility] will contact [name] to schedule per 8/18 los

## 2018-11-25 LAB — IRON AND TIBC
Iron: 63 ug/dL (ref 41–142)
Saturation Ratios: 21 % (ref 21–57)
TIBC: 295 ug/dL (ref 236–444)
UIBC: 232 ug/dL (ref 120–384)

## 2018-11-25 LAB — FERRITIN: Ferritin: 110 ng/mL (ref 11–307)

## 2018-11-25 LAB — LACTATE DEHYDROGENASE: LDH: 174 U/L (ref 98–192)

## 2018-11-30 ENCOUNTER — Other Ambulatory Visit: Payer: Self-pay

## 2018-11-30 ENCOUNTER — Inpatient Hospital Stay (HOSPITAL_BASED_OUTPATIENT_CLINIC_OR_DEPARTMENT_OTHER)
Admission: EM | Admit: 2018-11-30 | Discharge: 2018-12-09 | DRG: 064 | Disposition: A | Discharge status: Skilled Nursing Facility | Payer: Medicare Other | Attending: Internal Medicine | Admitting: Internal Medicine

## 2018-11-30 ENCOUNTER — Emergency Department (HOSPITAL_BASED_OUTPATIENT_CLINIC_OR_DEPARTMENT_OTHER): Payer: Medicare Other

## 2018-11-30 ENCOUNTER — Encounter (HOSPITAL_BASED_OUTPATIENT_CLINIC_OR_DEPARTMENT_OTHER): Payer: Self-pay | Admitting: *Deleted

## 2018-11-30 DIAGNOSIS — R4182 Altered mental status, unspecified: Secondary | ICD-10-CM

## 2018-11-30 DIAGNOSIS — Z9221 Personal history of antineoplastic chemotherapy: Secondary | ICD-10-CM | POA: Diagnosis not present

## 2018-11-30 DIAGNOSIS — N183 Chronic kidney disease, stage 3 (moderate): Secondary | ICD-10-CM | POA: Diagnosis present

## 2018-11-30 DIAGNOSIS — K222 Esophageal obstruction: Secondary | ICD-10-CM | POA: Diagnosis present

## 2018-11-30 DIAGNOSIS — I129 Hypertensive chronic kidney disease with stage 1 through stage 4 chronic kidney disease, or unspecified chronic kidney disease: Secondary | ICD-10-CM | POA: Diagnosis present

## 2018-11-30 DIAGNOSIS — I483 Typical atrial flutter: Secondary | ICD-10-CM

## 2018-11-30 DIAGNOSIS — N179 Acute kidney failure, unspecified: Secondary | ICD-10-CM | POA: Diagnosis present

## 2018-11-30 DIAGNOSIS — Z8501 Personal history of malignant neoplasm of esophagus: Secondary | ICD-10-CM | POA: Diagnosis not present

## 2018-11-30 DIAGNOSIS — G40909 Epilepsy, unspecified, not intractable, without status epilepticus: Secondary | ICD-10-CM | POA: Diagnosis not present

## 2018-11-30 DIAGNOSIS — G459 Transient cerebral ischemic attack, unspecified: Secondary | ICD-10-CM | POA: Diagnosis present

## 2018-11-30 DIAGNOSIS — R1319 Other dysphagia: Secondary | ICD-10-CM | POA: Diagnosis present

## 2018-11-30 DIAGNOSIS — R29700 NIHSS score 0: Secondary | ICD-10-CM | POA: Diagnosis present

## 2018-11-30 DIAGNOSIS — F0150 Vascular dementia without behavioral disturbance: Secondary | ICD-10-CM | POA: Diagnosis not present

## 2018-11-30 DIAGNOSIS — Y842 Radiological procedure and radiotherapy as the cause of abnormal reaction of the patient, or of later complication, without mention of misadventure at the time of the procedure: Secondary | ICD-10-CM | POA: Diagnosis present

## 2018-11-30 DIAGNOSIS — Z923 Personal history of irradiation: Secondary | ICD-10-CM

## 2018-11-30 DIAGNOSIS — F17210 Nicotine dependence, cigarettes, uncomplicated: Secondary | ICD-10-CM | POA: Diagnosis not present

## 2018-11-30 DIAGNOSIS — Z9114 Patient's other noncompliance with medication regimen: Secondary | ICD-10-CM

## 2018-11-30 DIAGNOSIS — G8194 Hemiplegia, unspecified affecting left nondominant side: Secondary | ICD-10-CM | POA: Diagnosis not present

## 2018-11-30 DIAGNOSIS — E7800 Pure hypercholesterolemia, unspecified: Secondary | ICD-10-CM | POA: Diagnosis present

## 2018-11-30 DIAGNOSIS — E785 Hyperlipidemia, unspecified: Secondary | ICD-10-CM | POA: Diagnosis present

## 2018-11-30 DIAGNOSIS — G9341 Metabolic encephalopathy: Secondary | ICD-10-CM | POA: Diagnosis present

## 2018-11-30 DIAGNOSIS — I639 Cerebral infarction, unspecified: Secondary | ICD-10-CM | POA: Diagnosis present

## 2018-11-30 DIAGNOSIS — C154 Malignant neoplasm of middle third of esophagus: Secondary | ICD-10-CM | POA: Diagnosis not present

## 2018-11-30 DIAGNOSIS — I63131 Cerebral infarction due to embolism of right carotid artery: Secondary | ICD-10-CM | POA: Diagnosis not present

## 2018-11-30 DIAGNOSIS — R569 Unspecified convulsions: Secondary | ICD-10-CM

## 2018-11-30 DIAGNOSIS — I480 Paroxysmal atrial fibrillation: Secondary | ICD-10-CM | POA: Diagnosis not present

## 2018-11-30 DIAGNOSIS — D638 Anemia in other chronic diseases classified elsewhere: Secondary | ICD-10-CM | POA: Diagnosis not present

## 2018-11-30 DIAGNOSIS — E269 Hyperaldosteronism, unspecified: Secondary | ICD-10-CM | POA: Diagnosis present

## 2018-11-30 DIAGNOSIS — Z7901 Long term (current) use of anticoagulants: Secondary | ICD-10-CM

## 2018-11-30 DIAGNOSIS — C159 Malignant neoplasm of esophagus, unspecified: Secondary | ICD-10-CM | POA: Diagnosis present

## 2018-11-30 DIAGNOSIS — I63231 Cerebral infarction due to unspecified occlusion or stenosis of right carotid arteries: Secondary | ICD-10-CM

## 2018-11-30 DIAGNOSIS — Z8673 Personal history of transient ischemic attack (TIA), and cerebral infarction without residual deficits: Secondary | ICD-10-CM

## 2018-11-30 DIAGNOSIS — E43 Unspecified severe protein-calorie malnutrition: Secondary | ICD-10-CM | POA: Diagnosis present

## 2018-11-30 DIAGNOSIS — I4892 Unspecified atrial flutter: Secondary | ICD-10-CM | POA: Diagnosis not present

## 2018-11-30 DIAGNOSIS — Z20828 Contact with and (suspected) exposure to other viral communicable diseases: Secondary | ICD-10-CM | POA: Diagnosis not present

## 2018-11-30 DIAGNOSIS — Z79899 Other long term (current) drug therapy: Secondary | ICD-10-CM

## 2018-11-30 LAB — DIFFERENTIAL
Abs Immature Granulocytes: 0.03 10*3/uL (ref 0.00–0.07)
Basophils Absolute: 0.1 10*3/uL (ref 0.0–0.1)
Basophils Relative: 0 %
Eosinophils Absolute: 0.3 10*3/uL (ref 0.0–0.5)
Eosinophils Relative: 3 %
Immature Granulocytes: 0 %
Lymphocytes Relative: 14 %
Lymphs Abs: 1.5 10*3/uL (ref 0.7–4.0)
Monocytes Absolute: 0.9 10*3/uL (ref 0.1–1.0)
Monocytes Relative: 8 %
Neutro Abs: 8.3 10*3/uL — ABNORMAL HIGH (ref 1.7–7.7)
Neutrophils Relative %: 75 %

## 2018-11-30 LAB — URINALYSIS, ROUTINE W REFLEX MICROSCOPIC
Bilirubin Urine: NEGATIVE
Glucose, UA: NEGATIVE mg/dL
Hgb urine dipstick: NEGATIVE
Ketones, ur: NEGATIVE mg/dL
Leukocytes,Ua: NEGATIVE
Nitrite: NEGATIVE
Protein, ur: NEGATIVE mg/dL
Specific Gravity, Urine: 1.025 (ref 1.005–1.030)
pH: 5.5 (ref 5.0–8.0)

## 2018-11-30 LAB — RAPID URINE DRUG SCREEN, HOSP PERFORMED
Amphetamines: NOT DETECTED
Barbiturates: NOT DETECTED
Benzodiazepines: NOT DETECTED
Cocaine: NOT DETECTED
Opiates: NOT DETECTED
Tetrahydrocannabinol: NOT DETECTED

## 2018-11-30 LAB — COMPREHENSIVE METABOLIC PANEL
ALT: 14 U/L (ref 0–44)
AST: 15 U/L (ref 15–41)
Albumin: 3.6 g/dL (ref 3.5–5.0)
Alkaline Phosphatase: 62 U/L (ref 38–126)
Anion gap: 10 (ref 5–15)
BUN: 39 mg/dL — ABNORMAL HIGH (ref 8–23)
CO2: 22 mmol/L (ref 22–32)
Calcium: 9.7 mg/dL (ref 8.9–10.3)
Chloride: 108 mmol/L (ref 98–111)
Creatinine, Ser: 1.94 mg/dL — ABNORMAL HIGH (ref 0.44–1.00)
GFR calc Af Amer: 30 mL/min — ABNORMAL LOW (ref >60)
GFR calc non Af Amer: 26 mL/min — ABNORMAL LOW (ref >60)
Glucose, Bld: 99 mg/dL (ref 70–99)
Potassium: 3.6 mmol/L (ref 3.5–5.1)
Sodium: 140 mmol/L (ref 135–145)
Total Bilirubin: 0.6 mg/dL (ref 0.3–1.2)
Total Protein: 6.9 g/dL (ref 6.5–8.1)

## 2018-11-30 LAB — CBC
HCT: 36.3 % (ref 36.0–46.0)
Hemoglobin: 11.3 g/dL — ABNORMAL LOW (ref 12.0–15.0)
MCH: 29 pg (ref 26.0–34.0)
MCHC: 31.1 g/dL (ref 30.0–36.0)
MCV: 93.3 fL (ref 80.0–100.0)
Platelets: 243 10*3/uL (ref 150–400)
RBC: 3.89 MIL/uL (ref 3.87–5.11)
RDW: 14.8 % (ref 11.5–15.5)
WBC: 11.2 10*3/uL — ABNORMAL HIGH (ref 4.0–10.5)
nRBC: 0 % (ref 0.0–0.2)

## 2018-11-30 LAB — PROTIME-INR
INR: 1.3 — ABNORMAL HIGH (ref 0.8–1.2)
Prothrombin Time: 16.4 seconds — ABNORMAL HIGH (ref 11.4–15.2)

## 2018-11-30 LAB — AMMONIA: Ammonia: 18 umol/L (ref 9–35)

## 2018-11-30 LAB — APTT: aPTT: 31 seconds (ref 24–36)

## 2018-11-30 LAB — CBG MONITORING, ED: Glucose-Capillary: 82 mg/dL (ref 70–99)

## 2018-11-30 MED ORDER — PANTOPRAZOLE SODIUM 40 MG PO TBEC
40.0000 mg | DELAYED_RELEASE_TABLET | Freq: Every day | ORAL | Status: DC
  Administered 2018-12-01 – 2018-12-09 (×9): 40 mg via ORAL
  Filled 2018-11-30 (×9): qty 1

## 2018-11-30 MED ORDER — SPIRONOLACTONE 25 MG PO TABS
50.0000 mg | ORAL_TABLET | Freq: Every day | ORAL | Status: DC
  Administered 2018-12-01 – 2018-12-09 (×9): 50 mg via ORAL
  Filled 2018-11-30 (×9): qty 2

## 2018-11-30 MED ORDER — ACETAMINOPHEN 325 MG PO TABS
650.0000 mg | ORAL_TABLET | ORAL | Status: DC | PRN
  Administered 2018-12-06: 650 mg via ORAL
  Filled 2018-11-30: qty 2

## 2018-11-30 MED ORDER — ACETAMINOPHEN 160 MG/5ML PO SOLN: 650.0000 mg | ORAL | Status: DC | PRN

## 2018-11-30 MED ORDER — FLUOXETINE HCL 10 MG PO CAPS
10.0000 mg | ORAL_CAPSULE | Freq: Every day | ORAL | Status: DC
  Administered 2018-12-01 – 2018-12-09 (×9): 10 mg via ORAL
  Filled 2018-11-30 (×9): qty 1

## 2018-11-30 MED ORDER — APIXABAN 5 MG PO TABS: 5.0000 mg | ORAL_TABLET | Freq: Two times a day (BID) | ORAL | Status: DC

## 2018-11-30 MED ORDER — ATORVASTATIN CALCIUM 40 MG PO TABS: 40.0000 mg | ORAL_TABLET | Freq: Every day | ORAL | Status: DC

## 2018-11-30 MED ORDER — SENNOSIDES-DOCUSATE SODIUM 8.6-50 MG PO TABS: 1.0000 | ORAL_TABLET | Freq: Every evening | ORAL | Status: DC | PRN

## 2018-11-30 MED ORDER — SODIUM CHLORIDE 0.9 % IV SOLN
INTRAVENOUS | Status: DC
  Administered 2018-12-01 – 2018-12-02 (×3): via INTRAVENOUS

## 2018-11-30 MED ORDER — STROKE: EARLY STAGES OF RECOVERY BOOK
Freq: Once | Status: AC
  Administered 2018-12-01: 01:00:00
  Filled 2018-11-30: qty 1

## 2018-11-30 MED ORDER — ACETAMINOPHEN 650 MG RE SUPP: 650.0000 mg | RECTAL | Status: DC | PRN

## 2018-11-30 MED ORDER — LEVETIRACETAM 500 MG PO TABS
500.0000 mg | ORAL_TABLET | Freq: Two times a day (BID) | ORAL | Status: DC
  Administered 2018-12-01 – 2018-12-09 (×17): 500 mg via ORAL
  Filled 2018-11-30 (×17): qty 1

## 2018-11-30 NOTE — ED Notes (Addendum)
Unable to accurately perform NIH scale as patient is not cooperative.  She wont answer questions and wont follow some commands.  Good grips and movement, denies numbness.  She denies difficulty ambulating but family relates she has been having some difficulty yesterday with facial droop and ambulation.  No facial droop noted today.  Family relates she may have some dementia.  No sensation changes with bilateral palpation.  Good dorsiflexion, noted some decrease with plantar flexion on left.

## 2018-11-30 NOTE — ED Notes (Addendum)
Pt. Was placed in an independent living facility and has not been taking her meds accordingly.  Pt. Is suppose to be taking seizure meds blood thinner and many other meds that her sisters are unsure if she has been taking her meds.

## 2018-11-30 NOTE — ED Notes (Signed)
Pt. Sister Rolan Lipa [phone]  Sister Rolan Lipa just left ... this sister is concerned about the Pt. Because the Pt. Has been at the [facility] where she is independent living and has not been taking her meds  Pt. Other sisters are concerned as well ... Pt. Is on Kepra and other important meds ... Pt. Sisters are wanting the Pt. To be in a Higher level of Care and somewhere the Pt. Will be given her meds to ensure the Pt. Takes her meds every day.     RN agreed to ask for a Social Work consult while Pt. Is in hospital before the Pt. Is discharged

## 2018-11-30 NOTE — ED Notes (Signed)
[facility] notified (Taryn) - patient ready for transport 

## 2018-11-30 NOTE — Progress Notes (Signed)
TOC CM returned call to ED RN. Plan is for IP admission. Jonnie Finner RN CCM Case Mgmt phone [phone]

## 2018-11-30 NOTE — ED Notes (Signed)
Report given to [facility]  

## 2018-11-30 NOTE — ED Triage Notes (Signed)
Left sided weakness yesterday at church. Today while at the store she was walking slow and dragging her left leg. She is alert oriented.

## 2018-11-30 NOTE — Progress Notes (Signed)
Patient with h/o CVA; seizures; esophageal CA (2015); HTN; HLD; and dementia presenting to [facility] with AMS.  She was seen by oncology for difficulty swallowing 8/18 with plan to send her to [facility].  Dr. Regenia Skeeter reports AMS, no clear baseline.  Concern that she may not be safe to live alone, forgetful.  Yesterday, seemed to have trouble walking on her left side - but ?normal exam here.  Clearly worsened confusion over several days compared to baseline, ?not taking meds the last few days.  CT/labs unremarkable.  ?need for MRI.  COVID pending, not febrile or symptomatic.  Will place in Observation for TIA evaluation.   Carlyon Shadow, M.D.

## 2018-11-30 NOTE — ED Notes (Signed)
Changed Pt. Depends after checked again and Pt. Has urinated in the depends.

## 2018-11-30 NOTE — ED Provider Notes (Signed)
[facility] EMERGENCY DEPARTMENT Provider Note   CSN: [id number] Arrival date & time: 11/30/18  1127    LEVEL 5 CAVEAT - ALTERED MENTAL STATUS   History   Chief Complaint Chief Complaint  Patient presents with  . Weakness    HPI Sheila Young is a 68 y.o. female.     HPI  67 year old female presents with altered mental status and possible stroke.  History taken from the sister at the bedside.  The patient was noted to be off yesterday when the sister was trying to make sure she was taking her medicines.  Seem to have some left-sided weakness and trouble walking.  Also did not seem like her normal self.  Definitely worse today.  Patient has some confusion issues and they wonder about early dementia but definitely is more altered today than typical.  Past Medical History:  Diagnosis Date  . Anger   . Esophagus cancer ([place]) 04/07/2014  . 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 History of noncompliance with medical treatment   . 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 Hypercholesteremia   . Hypertension   . Hypokalemia   . Mammogram abnormal   . Mechanical dysphagia 04/07/2014  . Radiation    54 Gy to distal esophagus  . Seizures ([place])   . Stroke [facility])     Patient Active Problem List   Diagnosis Date Noted  . TIA (transient ischemic attack) 11/30/2018  . Thrombocytopenia ([place]) 10/19/2017  . Atrial flutter ([place]) 10/17/2017  . 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 Seizure ([place]) 10/15/2017  . Aspiration pneumonia of both lower lobes due to gastric secretions ([place]) 10/15/2017  . Acute hypoxemic respiratory failure ([place]) 10/15/2017  . Rectal bleeding   . Acute blood loss anemia   . Platelet inhibition due to Plavix   . Diverticulosis of colon with hemorrhage   . GI bleed 09/03/2017  . Dysphagia   . Protein-calorie malnutrition, severe ([place]) 04/09/2014  . Esophagus cancer ([place]) 04/07/2014  . Mechanical dysphagia 04/07/2014  . Esophageal cancer ([place]) 04/07/2014    Past  Surgical History:  Procedure Laterality Date  . IR REMOVAL TUN ACCESS W/ PORT W/O FL MOD SED  02/18/2017     OB History   No obstetric history on file.      Home Medications    Prior to Admission medications   Medication Sig Start Date End Date Taking? Authorizing Provider  amLODipine (NORVASC) 10 MG tablet Take 0.5 tablets (5 mg total) by mouth daily. Patient taking differently: Take 10 mg by mouth daily.  12/15/14   Volanda Napoleon, MD  apixaban (ELIQUIS) 5 MG TABS tablet Take 1 tablet (5 mg total) by mouth 2 (two) times daily. 10/20/17   Samuella Cota, MD  atorvastatin (LIPITOR) 40 MG tablet Take 40 mg by mouth at bedtime.  03/16/14   [provider]  Cholecalciferol (VITAMIN D-3) 1000 units CAPS Take 2,000 Units by mouth daily.    [provider]  FLUoxetine (PROZAC) 10 MG capsule Take 1 capsule (10 mg total) by mouth daily. 08/13/16   Volanda Napoleon, MD  folic acid (FOLVITE) 1 MG tablet TAKE ONE TABLET BY MOUTH ONCE DAILY. (ROUND YELLOW TAB WITH V 31/62) 12/11/16   Volanda Napoleon, MD  levETIRAcetam (KEPPRA) 500 MG tablet Take 500 mg by mouth 2 (two) times daily.  11/22/15 10/16/17  [provider]  losartan (COZAAR) 50 MG tablet Take 25 mg by mouth 2 (two) times daily.     [provider]  magnesium oxide (MAG-OX) 400 (241.3 Mg) MG tablet Take 400 mg by mouth 2 (two) times daily.    [provider]  magnesium oxide (MAG-OX) 400 MG tablet Take 1 tablet bid 11/19/18   [provider]  metoprolol succinate (TOPROL-XL) 25 MG 24 hr tablet Take 3 tablets (75 mg total) by mouth daily. Take with or immediately following a meal. 10/21/17   Samuella Cota, MD  pantoprazole (PROTONIX) 40 MG tablet Take 1 tablet (40 mg total) by mouth daily. 01/11/15   Volanda Napoleon, MD  potassium chloride (KLOR-CON) 20 MEQ packet Take 40 mEq by mouth daily. 10/20/17   Samuella Cota, MD  spironolactone (ALDACTONE) 50 MG tablet Take 1 tablet (50 mg  total) by mouth daily. 10/21/17   Samuella Cota, MD    Family History Family History  Problem Relation Age of Onset  . GI Bleed Neg Hx   . GI Disease Neg Hx   . Stroke Neg Hx     Social History Social History   Tobacco Use  . Smoking status: Current Every Day Smoker    Packs/day: 0.50    Years: 45.00    Pack years: 22.50    Types: Cigarettes    Start date: 06/05/1968  . Smokeless tobacco: Never Used  Substance Use Topics  . Alcohol use: No    Alcohol/week: 0.0 standard drinks  . Drug use: No     Allergies   Patient has no known allergies.   Review of Systems Review of Systems  Unable to perform ROS: Mental status change     Physical Exam Updated Vital Signs BP (!) 146/66   Pulse 61   Temp 98.1 F (36.7 C) (Rectal)   Resp 19   SpO2 94%   Physical Exam Vitals signs and nursing note reviewed.  Constitutional:      Appearance: She is well-developed.  HENT:     Head: Normocephalic and atraumatic.     Right Ear: External ear normal.     Left Ear: External ear normal.     Nose: Nose normal.  Eyes:     General:        Right eye: No discharge.        Left eye: No discharge.     Extraocular Movements: Extraocular movements intact.     Pupils: Pupils are equal, round, and reactive to light.  Cardiovascular:     Rate and Rhythm: Regular rhythm. Bradycardia present.     Heart sounds: Normal heart sounds.  Pulmonary:     Effort: Pulmonary effort is normal.     Breath sounds: Normal breath sounds.  Abdominal:     Palpations: Abdomen is soft.     Tenderness: There is no abdominal tenderness.  Skin:    General: Skin is warm and dry.  Neurological:     Mental Status: She is alert.     Comments: Awake, alert, oriented to person and place. Recognizes sister. Does not really answer when asked date questions, doesn't guess or try. Doesn't answer for president. CN 3-12 grossly intact. 5/5 strength in all 4 extremities. Grossly normal sensation. Normal finger  to nose.   Psychiatric:        Mood and Affect: Mood is not anxious.      ED Treatments / Results  Labs (all labs ordered are listed, but only abnormal results are displayed) Labs Reviewed  PROTIME-INR - Abnormal; Notable for the following components:      Result  Value   Prothrombin Time 16.4 (*)    INR 1.3 (*)    All other components within normal limits  CBC - Abnormal; Notable for the following components:   WBC 11.2 (*)    Hemoglobin 11.3 (*)    All other components within normal limits  DIFFERENTIAL - Abnormal; Notable for the following components:   Neutro Abs 8.3 (*)    All other components within normal limits  COMPREHENSIVE METABOLIC PANEL - Abnormal; Notable for the following components:   BUN 39 (*)    Creatinine, Ser 1.94 (*)    GFR calc non Af Amer 26 (*)    GFR calc Af Amer 30 (*)    All other components within normal limits  SARS CORONAVIRUS 2  APTT  URINALYSIS, ROUTINE W REFLEX MICROSCOPIC  RAPID URINE DRUG SCREEN, HOSP PERFORMED  AMMONIA  CBG MONITORING, ED    EKG EKG Interpretation  Date/Time:  Monday November 30 2018 11:44:38 EDT Ventricular Rate:  56 PR Interval:    QRS Duration: 95 QT Interval:  472 QTC Calculation: 456 R Axis:   55 Text Interpretation:  Sinus rhythm no acute ST/T changes similar to april 2020 Confirmed by Sherwood Gambler [phone]) on 11/30/2018 2:46:59 PM   Radiology Ct Head Wo Contrast  Result Date: 11/30/2018 CLINICAL DATA:  Onset left-sided weakness yesterday. EXAM: CT HEAD WITHOUT CONTRAST TECHNIQUE: Contiguous axial images were obtained from the base of the skull through the vertex without intravenous contrast. COMPARISON:  Head CT scan 07/21/2018.  Brain MRI 05/08/2018. FINDINGS: Brain: No evidence of acute infarction, hemorrhage, hydrocephalus, extra-axial collection or mass lesion/mass effect. 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 Scattered lacunar infarctions including involvement of the basal ganglia, thalami  and right corona radiata also again seen. Vascular: No hyperdense vessel or unexpected calcification. Skull: Intact.  No focal lesion. Sinuses/Orbits: Negative. Other: None. IMPRESSION: No acute abnormality. Multiple remote infarcts, unchanged. Electronically Signed   By: Inge Rise M.D.   On: 11/30/2018 12:29   Dg Chest Portable 1 View  Result Date: 11/30/2018 CLINICAL DATA:  68 year old with acute onset of LEFT body weakness yesterday. Today the patient was dragging her LEFT leg while walking. Personal history of stroke and seizures. EXAM: PORTABLE CHEST 1 VIEW COMPARISON:  07/21/2018 and earlier. FINDINGS: Cardiac silhouette mildly enlarged for AP portable technique, unchanged. Lungs clear. Bronchovascular markings normal. Pulmonary vascularity normal. No visible pleural effusions. No pneumothorax. IMPRESSION: Stable mild cardiomegaly. No acute cardiopulmonary disease. Electronically Signed   By: Evangeline Dakin M.D.   On: 11/30/2018 12:48    Procedures Procedures (including critical care time)  Medications Ordered in ED Medications - No data to display   Initial Impression / Assessment and Plan / ED Course  I have reviewed the triage vital signs and the nursing notes.  Pertinent labs & imaging results that were available during my care of the patient were reviewed by me and considered in my medical decision making (see chart for details).        Unclear cause of the patient's confusion.  She has stable vital signs but is clearly altered from baseline.  No focal deficits.  Stroke could be in play though it seems more generalized.  I think an MRI would be warranted but this is not available at this facility.  Given she lives by her self and is altered I think she will need at least observation and further work-up.  Discussed with Dr. Lorin Mercy for admission.  Final Clinical Impressions(s) / ED  Diagnoses   Final diagnoses:  Altered mental status, unspecified altered mental status type     ED Discharge Orders    None       Sherwood Gambler, MD 11/30/18 1511

## 2018-11-30 NOTE — H&P (Addendum)
Sheila Young [id number] DOB: 06-Oct-1950 DOA: 11/30/2018     PCP: Beckie Salts, MD   Outpatient Specialists:      Oncology  Dr. Marin Olp  [facility]  Patient arrived to ER on 11/30/18 at 1127  Patient coming from: home Lives alone,       Chief Complaint:   Chief Complaint  Patient presents with  . Weakness    HPI: Sheila Young is a 68 y.o. female with medical history significant of CVA; seizures; esophageal CA dysphasia, hyperaldosteronism chronic anemia    Presented with   continued confusion for past few days compared to questionable baseline has not been taking her medications Ported left-sided weakness yesterday while at church today while walking at the store she was dragging her left leg Day family noticed some facial droop.  Patient presented to [place]. She has self unable to provide detailed history and unclear what her baseline is  ER MD able to discuss case with her sister who states that she noticed change in behavior yesterday and patient started to refuse to take her medication she is more altered than her typical self,   family have suspected that she started to have early dementia  Patient has been admitted in July for new onset atrial fibrillation with RVR and her Toprol was increased she is on an Eliquis  He also was diagnosed with aspiration pneumonia with acute hypoxic respiratory failure Infectious risk factors:  Reports none In  ER RAPID COVID TEST  in house testing  Pending    Regarding pertinent Chronic problems   Esophageal cancer status post radiation and chemotherapy  Hyperlipidemia -  on statins Lipitor  Hyperaldosteronism on spironolactone   HTN on NOrvasc, Losartan, toprolol, spironolactone   last echo July 2020 EF of 55% to 60%.  Seizure DO - on Keppra       Hx of CVA -  with/out residual deficits on Eliquis   A. Fib -  - CHA2DS2 vas score 4 :  current  on anticoagulation with   Eliquis,          -  Rate control:   Currently controlled with  Toprolol,       CKD stage III - baseline Cr  1.4   While in ER:  The following Work up has been ordered so far:  Orders Placed This Encounter  Procedures  . SARS CORONAVIRUS 2 Nasal Swab Aptima Multi Swab  . CT HEAD WO CONTRAST  . DG Chest Portable 1 View  . MR BRAIN WO CONTRAST  . Protime-INR  . APTT  . CBC  . Differential  . Comprehensive metabolic panel  . Urinalysis, Routine w reflex microscopic  . Urine rapid drug screen (hosp performed)  . Ammonia  . HIV antibody (Routine Testing)  . Hemoglobin A1c  . Lipid panel  . Urinalysis, dipstick only  . Levetiracetam level  . Diet NPO time specified  . Cardiac monitoring  . Stroke swallow screen  . NIH Stroke Scale  . Modified Stroke Scale (mNIHSS) Document mNIHSS assessment every 2 hours for a total of 12 hours  . Saline Lock IV, Maintain IV access  . If O2 Sat <94% administer O2 at 2 liters/minute via nasal cannula  . Check Rectal Temperature  . Catherize if unable to void  . Cardiac monitoring  . SCD's  . NIHSS score documentation NIHSS score range: 0-42  . Vital signs every 2 hours x 12 hours, then every 4 hours  .  Notify physician (specify)  . OOB with assistance  . Stroke swallow screen - If patient does NOT pass this screen, place order for SLP eval and treat (SLP2) - swallowing evaluation (BSE, MBS and/or diet order as indicated)  . RN to notify Physician for appropriate diet after patient passes stroke swallow screen  . Modified Stroke Scale (mNIHSS) every 2 hours x 12 hours then every 4 hours  . NIH stroke scale  . Intake and output  . Cardiac Monitoring - Continuous Indefinite  . Discuss with patient and document patient's goals for stroke risk factor reduction  . Initiate Oral Care Protocol  . Initiate Carrier Fluid Protocol  . Provide stroke education material to patient and family.  . Nurse to provide smoking / tobacco cessation education  . If the patient has passed the  Stroke Swallow Screen or has a feeding tube, then RN may order General Admission PRN Orders (through manage orders) for the following patient needs: allergy symptoms (Claritin), cold sores (Carmex), cough (Robitussin DM), eye irritation (Liquifilm Tears), hemorrhoids (Tucks), indigestion (Maalox), minor skin irritation (hydrocortisone cream), muscle pain Suezanne Jacquet Gay), nose irritation (saline nasal spray) and sore throat (Chloraseptic spray).  . Patient has an active order for admit to inpatient/place in observation  . Full code  . Consult to hospitalist  ALL PATIENTS BEING ADMITTED/HAVING PROCEDURES NEED COVID-19 SCREENING  . Consult to hospitalist  ALL PATIENTS BEING ADMITTED/HAVING PROCEDURES NEED COVID-19 SCREENING  . Consult to case management  . OT eval and treat  . PT eval and treat  . Pulse oximetry, continuous  . Pulse Oximetry Pulse Oximetry check every 2 hours X 12 hours, then every 4 hours.  . Oxygen therapy Mode or (Route): Nasal cannula; Liters Per Minute: 2; Keep 02 saturation: greater than 94 %  . SLP eval and treat Reason for evaluation: Cognitive/Language evaluation  . SLP eval and treat Reason for evaluation: .Swallowing evaluation (BSE, MBS and/or diet order as indicated)  . CBG monitoring, ED  . ED EKG  . EKG 12-Lead  . EKG 12-Lead  . EKG 12-Lead  . Place in observation (patient's expected length of stay will be less than 2 midnights)  . Fall precautions  . Aspiration precautions  . Seizure precautions    Following Medications were ordered in ER: Medications  spironolactone (ALDACTONE) tablet 50 mg (has no administration in time range)  pantoprazole (PROTONIX) EC tablet 40 mg (has no administration in time range)  FLUoxetine (PROZAC) capsule 10 mg (has no administration in time range)  apixaban (ELIQUIS) tablet 5 mg (5 mg Oral Not Given 12/01/18 0045)  levETIRAcetam (KEPPRA) tablet 500 mg (0 mg Oral Hold 12/01/18 0045)  0.9 %  sodium chloride infusion (has no  administration in time range)  acetaminophen (TYLENOL) tablet 650 mg (has no administration in time range)    Or  acetaminophen (TYLENOL) solution 650 mg (has no administration in time range)    Or  acetaminophen (TYLENOL) suppository 650 mg (has no administration in time range)  senna-docusate (Senokot-S) tablet 1 tablet (has no administration in time range)   stroke: mapping our early stages of recovery book ( Does not apply Given 12/01/18 0045)  LORazepam (ATIVAN) injection 0.5 mg (0.5 mg Intravenous Given 12/01/18 0132)        Consult Orders  (From admission, onward)         Start     Ordered   11/30/18 2343  SLP eval and treat Reason for evaluation: .Swallowing evaluation (BSE, MBS and/or  diet order as indicated)  Once    Question:  Reason for evaluation  Answer:  .Swallowing evaluation (BSE, MBS and/or diet order as indicated)   11/30/18 2342   11/30/18 2332  PT eval and treat  Routine     11/30/18 2336   11/30/18 2332  OT eval and treat  Routine     11/30/18 2336   11/30/18 2332  SLP eval and treat Reason for evaluation: Cognitive/Language evaluation  Once    Question:  Reason for evaluation  Answer:  Cognitive/Language evaluation   11/30/18 2336   11/30/18 1635  Consult to case management  Once    Provider:  (Not yet assigned)  Question Answer Comment  Place call to: needs higher of care upon discharge   Reason for Consult Admit      11/30/18 1634   11/30/18 1427  Consult to hospitalist  ALL PATIENTS BEING ADMITTED/HAVING PROCEDURES NEED COVID-19 SCREENING Called [facility] spoke with Marcello Moores  Once    Comments: ALL PATIENTS BEING ADMITTED/HAVING PROCEDURES NEED COVID-19 SCREENING  Provider:  (Not yet assigned)  Question Answer Comment  Place call to: [facility]   Reason for Consult Admit      11/30/18 1426   11/30/18 1342  Consult to hospitalist  ALL PATIENTS BEING ADMITTED/HAVING PROCEDURES NEED COVID-19 SCREENING Called [facility] spoke with Marcello Moores  Once     Comments: ALL PATIENTS BEING ADMITTED/HAVING PROCEDURES NEED COVID-19 SCREENING  Provider:  (Not yet assigned)  Question Answer Comment  Place call to: [facility]   Reason for Consult Admit      11/30/18 1341           Significant initial  Findings: Abnormal Labs Reviewed  PROTIME-INR - Abnormal; Notable for the following components:      Result Value   Prothrombin Time 16.4 (*)    INR 1.3 (*)    All other components within normal limits  CBC - Abnormal; Notable for the following components:   WBC 11.2 (*)    Hemoglobin 11.3 (*)    All other components within normal limits  DIFFERENTIAL - Abnormal; Notable for the following components:   Neutro Abs 8.3 (*)    All other components within normal limits  COMPREHENSIVE METABOLIC PANEL - Abnormal; Notable for the following components:   BUN 39 (*)    Creatinine, Ser 1.94 (*)    GFR calc non Af Amer 26 (*)    GFR calc Af Amer 30 (*)    All other components within normal limits    Otherwise labs showing:    Recent Labs  Lab 11/24/18 1319 11/30/18 1149  NA 144 140  K 4.6 3.6  CO2 27 22  GLUCOSE 105* 99  BUN 30* 39*  CREATININE 1.85* 1.94*  CALCIUM 9.8 9.7    Cr     Up from baseline see below Lab Results  Component Value Date   CREATININE 1.94 (H) 11/30/2018   CREATININE 1.85 (H) 11/24/2018   CREATININE 1.51 (H) 07/21/2018    Recent Labs  Lab 11/24/18 1319 11/30/18 1149  AST 13* 15  ALT 10 14  ALKPHOS 70 62  BILITOT 0.4 0.6  PROT 6.8 6.9  ALBUMIN 3.9 3.6   Lab Results  Component Value Date   CALCIUM 9.7 11/30/2018   PHOS 2.5 10/17/2017       WBC      Component Value Date/Time   WBC 11.2 (H) 11/30/2018 1149   ANC    Component Value Date/Time  NEUTROABS 8.3 (H) 11/30/2018 1149   NEUTROABS 5.8 02/04/2017 1358   NEUTROABS 7.2 (H) 09/12/2014 0920   ALC No results found for: LYMPHOABS    Plt: Lab Results  Component Value Date   PLT 243 11/30/2018     Lactic Acid, Venous No  results found for: LATICACIDVEN    COVID-19 Labs    No results found for: SARSCOV2NAA   ABG    Component Value Date/Time   HCO3 32.7 (H) 09/04/2017 0809   TCO2 34 (H) 09/04/2017 0809   O2SAT 32.0 09/04/2017 0809    HG/HCT  Stable     Component Value Date/Time   HGB 11.3 (L) 11/30/2018 1149   HGB 11.3 (L) 11/24/2018 1319   HGB 10.8 (L) 02/04/2017 1358   HGB 13.2 09/12/2014 0920   HCT 36.3 11/30/2018 1149   HCT 33.4 (L) 02/04/2017 1358   HCT 38.4 09/12/2014 0920    No results for input(s): LIPASE, AMYLASE in the last 168 hours. Recent Labs  Lab 11/30/18 1232  AMMONIA 18     Troponin 6   CBG (last 3)  Recent Labs    11/30/18 1214  GLUCAP 82     UA  no evidence of UTI      Urine analysis:    Component Value Date/Time   COLORURINE YELLOW 11/30/2018 [place] 11/30/2018 1232   LABSPEC 1.025 11/30/2018 1232   PHURINE 5.5 11/30/2018 [place] 11/30/2018 1232   [place] 11/30/2018 [place] 11/30/2018 1232   [place] 11/30/2018 1232   PROTEINUR NEGATIVE 11/30/2018 1232   NITRITE NEGATIVE 11/30/2018 1232   LEUKOCYTESUR NEGATIVE 11/30/2018 1232      CT HEAD   NON acute, remote CVA's  CXR -  NON acute, mild cardiomegally   ECG:  Personally reviewed by me showing: HR : 56 Rhythm:  NSR,    , no evidence of ischemic changes QTC 456      ED Triage Vitals  Enc Vitals Group     BP 11/30/18 1146 127/60     Pulse Rate 11/30/18 1132 61     Resp 11/30/18 1132 12     Temp 11/30/18 1132 97.9 F (36.6 C)     Temp Source 11/30/18 1132 Oral     SpO2 11/30/18 1132 98 %     Weight 11/30/18 2219 178 lb 9.2 oz (81 kg)     Height --      Head Circumference --      Peak Flow --      Pain Score 11/30/18 1136 0     Pain Loc --      Pain Edu? --      Excl. in [place]? --   TMAX(24)@       Latest  Blood pressure 137/61, pulse (!) 59, temperature 98 F (36.7 C), temperature source Oral, resp. rate 18,  weight 81 kg, SpO2 98 %.     Hospitalist was called for admission for TIA vs Acute encephalopathy   Review of Systems:    Pertinent positives include:  fatigue, confusion  localizing neurological complaints, Constitutional:  No weight loss, night sweats, Fevers, chills, weight loss  HEENT:  No headaches, Difficulty swallowing,Tooth/dental problems,Sore throat,  No sneezing, itching, ear ache, nasal congestion, post nasal drip,  Cardio-vascular:  No chest pain, Orthopnea, PND, anasarca, dizziness, palpitations.no Bilateral lower extremity swelling  GI:  No heartburn, indigestion, abdominal pain, nausea, vomiting, diarrhea, change in bowel habits, loss  of appetite, melena, blood in stool, hematemesis Resp:  no shortness of breath at rest. No dyspnea on exertion, No excess mucus, no productive cough, No non-productive cough, No coughing up of blood.No change in color of mucus.No wheezing. Skin:  no rash or lesions. No jaundice GU:  no dysuria, change in color of urine, no urgency or frequency. No straining to urinate.  No flank pain.  Musculoskeletal:  No joint pain or no joint swelling. No decreased range of motion. No back pain.  Psych:  No change in mood or affect. No depression or anxiety. No memory loss.  Neuro: no no tingling, no weakness, no double vision, no gait abnormality, no slurred speech, no  All systems reviewed and apart from [place] all are negative  Past Medical History:   Past Medical History:  Diagnosis Date  . Anger   . Esophagus cancer ([place]) 04/07/2014  . High aldosterone ([place])   . History of noncompliance with medical treatment   . Hyperaldosteronism ([place]) 10/17/2017  . Hypercholesteremia   . Hypertension   . Hypokalemia   . Mammogram abnormal   . Mechanical dysphagia 04/07/2014  . Radiation    54 Gy to distal esophagus  . Seizures ([place])   . Stroke [facility])        Past Surgical History:  Procedure Laterality Date  . IR REMOVAL TUN ACCESS W/ PORT  W/O FL MOD SED  02/18/2017    Social History:  Ambulatory   walker       reports that she has been smoking cigarettes. She started smoking about 50 years ago. She has a 22.50 pack-year smoking history. She has never used smokeless tobacco. She reports that she does not drink alcohol or use drugs.     Family History:   Family History  Problem Relation Age of Onset  . GI Bleed Neg Hx   . GI Disease Neg Hx   . Stroke Neg Hx     Allergies: No Known Allergies   Prior to Admission medications   Medication Sig Start Date End Date Taking? Authorizing Provider  amLODipine (NORVASC) 10 MG tablet Take 0.5 tablets (5 mg total) by mouth daily. Patient taking differently: Take 10 mg by mouth daily.  12/15/14   Volanda Napoleon, MD  apixaban (ELIQUIS) 5 MG TABS tablet Take 1 tablet (5 mg total) by mouth 2 (two) times daily. 10/20/17   Samuella Cota, MD  atorvastatin (LIPITOR) 40 MG tablet Take 40 mg by mouth at bedtime.  03/16/14   [provider]  Cholecalciferol (VITAMIN D-3) 1000 units CAPS Take 2,000 Units by mouth daily.    [provider]  FLUoxetine (PROZAC) 10 MG capsule Take 1 capsule (10 mg total) by mouth daily. 08/13/16   Volanda Napoleon, MD  folic acid (FOLVITE) 1 MG tablet TAKE ONE TABLET BY MOUTH ONCE DAILY. (ROUND YELLOW TAB WITH V 31/62) 12/11/16   Volanda Napoleon, MD  levETIRAcetam (KEPPRA) 500 MG tablet Take 500 mg by mouth 2 (two) times daily.  11/22/15 10/16/17  [provider]  losartan (COZAAR) 50 MG tablet Take 25 mg by mouth 2 (two) times daily.     [provider]  magnesium oxide (MAG-OX) 400 (241.3 Mg) MG tablet Take 400 mg by mouth 2 (two) times daily.    [provider]  magnesium oxide (MAG-OX) 400 MG tablet Take 1 tablet bid 11/19/18   [provider]  metoprolol succinate (TOPROL-XL) 25 MG 24 hr tablet Take 3 tablets (  75 mg total) by mouth daily. Take with or immediately following a meal. 10/21/17   Samuella Cota, MD  pantoprazole (PROTONIX) 40 MG tablet Take 1 tablet (40 mg total) by mouth daily. 01/11/15   Volanda Napoleon, MD  potassium chloride (KLOR-CON) 20 MEQ packet Take 40 mEq by mouth daily. 10/20/17   Samuella Cota, MD  spironolactone (ALDACTONE) 50 MG tablet Take 1 tablet (50 mg total) by mouth daily. 10/21/17   Samuella Cota, MD   Physical Exam: Blood pressure 137/61, pulse (!) 59, temperature 98 F (36.7 C), temperature source Oral, resp. rate 18, weight 81 kg, SpO2 98 %. 1. General:  in No  Acute distress   Chronically ill  -appearing 2. Psychological: Alert and   Oriented to self 3. Head/ENT:     Dry Mucous Membranes                          Head Non traumatic, neck supple                          Poor Dentition 4. SKIN:   decreased Skin turgor,  Skin clean Dry and intact no rash 5. Heart: Regular rate and rhythm no  Murmur, no Rub or gallop 6. Lungs:  no wheezes or crackles   7. Abdomen: Soft,  non-tender, Non distended bowel sounds present 8. Lower extremities: no clubbing, cyanosis, no  edema 9. Neurologically   strength 5 out of 5 in all 4 extremities cranial nerves II through XII intact 10. MSK: Normal range of motion   All other LABS:     Recent Labs  Lab 11/24/18 1319 11/30/18 1149  WBC 10.4 11.2*  NEUTROABS 7.2 8.3*  HGB 11.3* 11.3*  HCT 36.3 36.3  MCV 93.3 93.3  PLT 243 243     Recent Labs  Lab 11/24/18 1319 11/30/18 1149  NA 144 140  K 4.6 3.6  CL 108 108  CO2 27 22  GLUCOSE 105* 99  BUN 30* 39*  CREATININE 1.85* 1.94*  CALCIUM 9.8 9.7     Recent Labs  Lab 11/24/18 1319 11/30/18 1149  AST 13* 15  ALT 10 14  ALKPHOS 70 62  BILITOT 0.4 0.6  PROT 6.8 6.9  ALBUMIN 3.9 3.6       Cultures:    Component Value Date/Time   SDES BLOOD RIGHT HAND 10/14/2017 2015   SPECREQUEST  10/14/2017 2015    BOTTLES DRAWN AEROBIC ONLY Blood Culture adequate volume   CULT  10/14/2017 2015    NO GROWTH 6 DAYS Performed at [facility] [address], [place], [place] [address]    REPTSTATUS 10/20/2017 FINAL 10/14/2017 2015     Radiological Exams on Admission: Ct Head Wo Contrast  Result Date: 11/30/2018 CLINICAL DATA:  Onset left-sided weakness yesterday. EXAM: CT HEAD WITHOUT CONTRAST TECHNIQUE: Contiguous axial images were obtained from the base of the skull through the vertex without intravenous contrast. COMPARISON:  Head CT scan 07/21/2018.  Brain MRI 05/08/2018. FINDINGS: Brain: No evidence of acute infarction, hemorrhage, hydrocephalus, extra-axial collection or mass lesion/mass effect. 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 Scattered lacunar infarctions including involvement of the basal ganglia, thalami and right corona radiata also again seen. Vascular: No hyperdense vessel or unexpected calcification. Skull: Intact.  No focal lesion. Sinuses/Orbits: Negative. Other: None. IMPRESSION: No acute abnormality. Multiple remote infarcts, unchanged. Electronically Signed  By: Inge Rise M.D.   On: 11/30/2018 12:29   Mr Brain Wo Contrast  Result Date: 12/01/2018 CLINICAL DATA:  Focal neurologic deficit. History of esophageal cancer. EXAM: MRI HEAD WITHOUT CONTRAST TECHNIQUE: Multiplanar, multiecho pulse sequences of the brain and surrounding structures were obtained without intravenous contrast. COMPARISON:  Head CT 11/30/2018 and brain MRI 05/08/2018 FINDINGS: BRAIN: There are multiple small foci of abnormal diffusion restriction within the right hemisphere, within the MCA and PCA territories. There is a single punctate focus of diffusion restriction in the left hemisphere near the hand motor area. There is an old infarct of the left PCA territory with associated encephalomalacia. Multiple old small vessel infarcts on the right. Multifocal white matter hyperintensity, most commonly due to chronic ischemic microangiopathy. The cerebral and cerebellar volume are age-appropriate. There is no hydrocephalus. The  midline structures are normal. VASCULAR: Susceptibility-sensitive sequences show no chronic microhemorrhage or superficial siderosis. Abnormal right ICA flow void at the skull base. SKULL AND UPPER CERVICAL SPINE: Calvarial bone marrow signal is normal. There is no skull base mass. The visualized upper cervical spine and soft tissues are normal. SINUSES/ORBITS: There are no fluid levels or advanced mucosal thickening. The mastoid air cells and middle ear cavities are free of fluid. The orbits are normal. IMPRESSION: 1. Multifocal acute ischemia, predominantly within the right hemisphere, within multiple vascular territories, possibly embolic. 2. Single punctate focus of ischemia within the left hemisphere along the left precentral gyrus. 3. Abnormal right ICA flow void at the skull base which may indicate slow flow or occlusion. CTA or MRA may be helpful for further characterization. 4. Multiple old infarcts and findings of chronic small vessel ischemia. Electronically Signed   By: Ulyses Jarred M.D.   On: 12/01/2018 01:48   Dg Chest Portable 1 View  Result Date: 11/30/2018 CLINICAL DATA:  68 year old with acute onset of LEFT body weakness yesterday. Today the patient was dragging her LEFT leg while walking. Personal history of stroke and seizures. EXAM: PORTABLE CHEST 1 VIEW COMPARISON:  07/21/2018 and earlier. FINDINGS: Cardiac silhouette mildly enlarged for AP portable technique, unchanged. Lungs clear. Bronchovascular markings normal. Pulmonary vascularity normal. No visible pleural effusions. No pneumothorax. IMPRESSION: Stable mild cardiomegaly. No acute cardiopulmonary disease. Electronically Signed   By: Evangeline Dakin M.D.   On: 11/30/2018 12:48    Chart has been reviewed    Assessment/Plan   68 y.o. female with medical history significant of CVA; seizures; esophageal CA dysphasia, hyperaldosteronism chronic anemia Admitted for TIA vs cute encephalopathy  Present on Admission: . CVA MRI  showed multiple area of ischemia possible embolic with area of slow flow appreciate neurology input pt currently on eliquis as per Neurology will hold until further studies have resulted  - will admit based on TIA/CVA protocol,          Monitor on Tele       MRA                May need Echo to evaluate for possible embolic source,        obtain cardiac enzymes,  ECG,   Lipid panel, TSH.        Order PT/OT evaluation.        will need Speech pathology evaluation       Will make sure patient is on antiplatelet        Allow permissive Hypertension keep BP <220/120        Neurology consulted Have seen pt on arrival   .  Esophagus cancer ([place]) - followed by Oncology   . Mechanical dysphagia - SLP eval . Protein-calorie malnutrition, severe ([facility]) - check prealbumin and order nutritional consult . Atrial flutter ([place])  monitor on tele -please anticoagulation and consider neurology restart rate control when able to tolerate blood pressure medications  . Hyperaldosteronism ([place]) would restart Spironolactone as  Able  . Acute metabolic encephalopathy -in the setting of CVA monitor for any signs of infectious process may have mild dementia at baseline prior to discharge will benefit from PT OT evaluation  HX of seizure - continue home medicaitons as per Neurology   Other plan as per orders.  DVT prophylaxis:  SCD      Code Status:  FULL CODE    Family Communication:   Family not at  Bedside    Disposition Plan:      To home once workup is complete and patient is stable                      Would benefit from PT/OT eval prior to DC  Ordered                   Swallow eval - SLP ordered                                                        Consults called: Neurology aware    Admission status:  ED Disposition    ED Disposition Condition [place]: [place] [[id number]]  Level of Care: Telemetry Medical [104]  I expect the patient will be discharged  within 24 hours: Yes  LOW acuity---Tx typically complete <24 hrs---ACUTE conditions typically can be evaluated <24 hours---LABS likely to return to acceptable levels <24 hours---IS near functional baseline---EXPECTED to return to current living arrangement---NOT newly hypoxic: Meets criteria for 5C-Observation unit  Covid Evaluation: Asymptomatic Screening Protocol (No Symptoms)  Diagnosis: TIA (transient ischemic attackPP:[id number]  Admitting Physician: Karmen Bongo [[id number]]  Attending Physician: Karmen Bongo [[id number]]  PT Class (Do Not Modify): Observation [104]  PT Acc Code (Do Not Modify): Observation [10022]         Obs   Level of care     tele    indefinitely please discontinue once patient no longer qualifies  Precautions:      Droplet,    PPE: Used by the provider:   P100  eye Goggles,  Gloves    Dolton Shaker 12/01/2018, 2:26 AM    [facility]     after 2 AM please page floor coverage PA If 7AM-7PM, please contact the day team taking care of the patient using [email or web address]

## 2018-12-01 ENCOUNTER — Inpatient Hospital Stay (HOSPITAL_COMMUNITY): Payer: Medicare Other

## 2018-12-01 ENCOUNTER — Observation Stay (HOSPITAL_COMMUNITY): Payer: Medicare Other

## 2018-12-01 DIAGNOSIS — G9341 Metabolic encephalopathy: Secondary | ICD-10-CM | POA: Diagnosis present

## 2018-12-01 DIAGNOSIS — I480 Paroxysmal atrial fibrillation: Secondary | ICD-10-CM | POA: Diagnosis present

## 2018-12-01 DIAGNOSIS — N179 Acute kidney failure, unspecified: Secondary | ICD-10-CM | POA: Diagnosis present

## 2018-12-01 DIAGNOSIS — R1319 Other dysphagia: Secondary | ICD-10-CM | POA: Diagnosis present

## 2018-12-01 DIAGNOSIS — Z8501 Personal history of malignant neoplasm of esophagus: Secondary | ICD-10-CM | POA: Diagnosis not present

## 2018-12-01 DIAGNOSIS — I361 Nonrheumatic tricuspid (valve) insufficiency: Secondary | ICD-10-CM | POA: Diagnosis not present

## 2018-12-01 DIAGNOSIS — I484 Atypical atrial flutter: Secondary | ICD-10-CM | POA: Diagnosis not present

## 2018-12-01 DIAGNOSIS — C154 Malignant neoplasm of middle third of esophagus: Secondary | ICD-10-CM | POA: Diagnosis not present

## 2018-12-01 DIAGNOSIS — I340 Nonrheumatic mitral (valve) insufficiency: Secondary | ICD-10-CM | POA: Diagnosis not present

## 2018-12-01 DIAGNOSIS — I6340 Cerebral infarction due to embolism of unspecified cerebral artery: Secondary | ICD-10-CM

## 2018-12-01 DIAGNOSIS — R4182 Altered mental status, unspecified: Secondary | ICD-10-CM

## 2018-12-01 DIAGNOSIS — Y842 Radiological procedure and radiotherapy as the cause of abnormal reaction of the patient, or of later complication, without mention of misadventure at the time of the procedure: Secondary | ICD-10-CM | POA: Diagnosis present

## 2018-12-01 DIAGNOSIS — G8194 Hemiplegia, unspecified affecting left nondominant side: Secondary | ICD-10-CM | POA: Diagnosis present

## 2018-12-01 DIAGNOSIS — Z8673 Personal history of transient ischemic attack (TIA), and cerebral infarction without residual deficits: Secondary | ICD-10-CM

## 2018-12-01 DIAGNOSIS — E269 Hyperaldosteronism, unspecified: Secondary | ICD-10-CM | POA: Diagnosis present

## 2018-12-01 DIAGNOSIS — Z9114 Patient's other noncompliance with medication regimen: Secondary | ICD-10-CM | POA: Diagnosis not present

## 2018-12-01 DIAGNOSIS — I63231 Cerebral infarction due to unspecified occlusion or stenosis of right carotid arteries: Secondary | ICD-10-CM

## 2018-12-01 DIAGNOSIS — R569 Unspecified convulsions: Secondary | ICD-10-CM | POA: Diagnosis not present

## 2018-12-01 DIAGNOSIS — G92 Toxic encephalopathy: Secondary | ICD-10-CM

## 2018-12-01 DIAGNOSIS — F0150 Vascular dementia without behavioral disturbance: Secondary | ICD-10-CM | POA: Diagnosis not present

## 2018-12-01 DIAGNOSIS — E43 Unspecified severe protein-calorie malnutrition: Secondary | ICD-10-CM | POA: Diagnosis present

## 2018-12-01 DIAGNOSIS — Z20828 Contact with and (suspected) exposure to other viral communicable diseases: Secondary | ICD-10-CM | POA: Diagnosis present

## 2018-12-01 DIAGNOSIS — Z9221 Personal history of antineoplastic chemotherapy: Secondary | ICD-10-CM | POA: Diagnosis not present

## 2018-12-01 DIAGNOSIS — I129 Hypertensive chronic kidney disease with stage 1 through stage 4 chronic kidney disease, or unspecified chronic kidney disease: Secondary | ICD-10-CM | POA: Diagnosis present

## 2018-12-01 DIAGNOSIS — I639 Cerebral infarction, unspecified: Secondary | ICD-10-CM | POA: Diagnosis present

## 2018-12-01 DIAGNOSIS — E785 Hyperlipidemia, unspecified: Secondary | ICD-10-CM | POA: Diagnosis present

## 2018-12-01 DIAGNOSIS — N183 Chronic kidney disease, stage 3 (moderate): Secondary | ICD-10-CM | POA: Diagnosis present

## 2018-12-01 DIAGNOSIS — R29700 NIHSS score 0: Secondary | ICD-10-CM | POA: Diagnosis present

## 2018-12-01 DIAGNOSIS — I4892 Unspecified atrial flutter: Secondary | ICD-10-CM | POA: Diagnosis present

## 2018-12-01 DIAGNOSIS — K222 Esophageal obstruction: Secondary | ICD-10-CM | POA: Diagnosis present

## 2018-12-01 DIAGNOSIS — I63131 Cerebral infarction due to embolism of right carotid artery: Secondary | ICD-10-CM | POA: Diagnosis present

## 2018-12-01 DIAGNOSIS — G459 Transient cerebral ischemic attack, unspecified: Secondary | ICD-10-CM | POA: Diagnosis present

## 2018-12-01 DIAGNOSIS — G40909 Epilepsy, unspecified, not intractable, without status epilepticus: Secondary | ICD-10-CM | POA: Diagnosis present

## 2018-12-01 DIAGNOSIS — E7800 Pure hypercholesterolemia, unspecified: Secondary | ICD-10-CM | POA: Diagnosis present

## 2018-12-01 DIAGNOSIS — D638 Anemia in other chronic diseases classified elsewhere: Secondary | ICD-10-CM | POA: Diagnosis present

## 2018-12-01 DIAGNOSIS — F17210 Nicotine dependence, cigarettes, uncomplicated: Secondary | ICD-10-CM | POA: Diagnosis present

## 2018-12-01 DIAGNOSIS — I483 Typical atrial flutter: Secondary | ICD-10-CM | POA: Diagnosis not present

## 2018-12-01 LAB — RETICULOCYTES
Immature Retic Fract: 6.9 % (ref 2.3–15.9)
RBC.: 4.16 MIL/uL (ref 3.87–5.11)
Retic Count, Absolute: 69.9 10*3/uL (ref 19.0–186.0)
Retic Ct Pct: 1.7 % (ref 0.4–3.1)

## 2018-12-01 LAB — PREALBUMIN: Prealbumin: 23.9 mg/dL (ref 18–38)

## 2018-12-01 LAB — HIV ANTIBODY (ROUTINE TESTING W REFLEX): HIV Screen 4th Generation wRfx: NONREACTIVE

## 2018-12-01 LAB — LIPID PANEL
Cholesterol: 175 mg/dL (ref 0–200)
HDL: 30 mg/dL — ABNORMAL LOW (ref >40)
LDL Cholesterol: 121 mg/dL — ABNORMAL HIGH (ref 0–99)
Total CHOL/HDL Ratio: 5.8 RATIO
Triglycerides: 119 mg/dL (ref <150)
VLDL: 24 mg/dL (ref 0–40)

## 2018-12-01 LAB — IRON AND TIBC
Iron: 52 ug/dL (ref 28–170)
Saturation Ratios: 19 % (ref 10.4–31.8)
TIBC: 276 ug/dL (ref 250–450)
UIBC: 224 ug/dL

## 2018-12-01 LAB — HEMOGLOBIN A1C
Hgb A1c MFr Bld: 6.1 % — ABNORMAL HIGH (ref 4.8–5.6)
Mean Plasma Glucose: 128.37 mg/dL

## 2018-12-01 LAB — SARS CORONAVIRUS 2 (TAT 6-24 HRS): SARS Coronavirus 2: NEGATIVE

## 2018-12-01 LAB — TROPONIN I (HIGH SENSITIVITY)
Troponin I (High Sensitivity): 6 ng/L (ref <18)
Troponin I (High Sensitivity): 6 ng/L (ref <18)

## 2018-12-01 LAB — FERRITIN: Ferritin: 115 ng/mL (ref 11–307)

## 2018-12-01 LAB — FOLATE: Folate: 14 ng/mL (ref >5.9)

## 2018-12-01 LAB — VITAMIN B12: Vitamin B-12: 590 pg/mL (ref 180–914)

## 2018-12-01 LAB — ECHOCARDIOGRAM COMPLETE
Height: 66 in
Weight: 2857.16 oz

## 2018-12-01 MED ORDER — ASPIRIN 325 MG PO TABS: 325.0000 mg | ORAL_TABLET | Freq: Every day | ORAL | Status: DC

## 2018-12-01 MED ORDER — ATORVASTATIN CALCIUM 40 MG PO TABS: 40.0000 mg | ORAL_TABLET | Freq: Every day | ORAL | Status: DC

## 2018-12-01 MED ORDER — ATORVASTATIN CALCIUM 80 MG PO TABS
80.0000 mg | ORAL_TABLET | Freq: Every day | ORAL | Status: DC
  Administered 2018-12-01 – 2018-12-08 (×8): 80 mg via ORAL
  Filled 2018-12-01 (×9): qty 1

## 2018-12-01 MED ORDER — ASPIRIN EC 81 MG PO TBEC
81.0000 mg | DELAYED_RELEASE_TABLET | Freq: Every day | ORAL | Status: DC
  Administered 2018-12-02 – 2018-12-09 (×8): 81 mg via ORAL
  Filled 2018-12-01 (×9): qty 1

## 2018-12-01 MED ORDER — LORAZEPAM 2 MG/ML IJ SOLN
0.5000 mg | Freq: Once | INTRAMUSCULAR | Status: AC
  Administered 2018-12-01: 0.5 mg via INTRAVENOUS
  Filled 2018-12-01: qty 1

## 2018-12-01 MED ORDER — ADULT MULTIVITAMIN W/MINERALS CH
1.0000 | ORAL_TABLET | Freq: Every day | ORAL | Status: DC
  Administered 2018-12-01 – 2018-12-09 (×9): 1 via ORAL
  Filled 2018-12-01 (×9): qty 1

## 2018-12-01 MED ORDER — ENSURE ENLIVE PO LIQD
237.0000 mL | Freq: Two times a day (BID) | ORAL | Status: DC
  Administered 2018-12-01 – 2018-12-09 (×15): 237 mL via ORAL

## 2018-12-01 MED ORDER — APIXABAN 5 MG PO TABS
5.0000 mg | ORAL_TABLET | Freq: Two times a day (BID) | ORAL | Status: DC
  Administered 2018-12-01 – 2018-12-09 (×16): 5 mg via ORAL
  Filled 2018-12-01 (×17): qty 1

## 2018-12-01 NOTE — Evaluation (Signed)
Occupational Therapy Evaluation Patient Details Name: Sheila Young MRN: [id number] DOB: 08-09-1950 Today's Date: 12/01/2018    History of Present Illness 68 y.o. female admitted on 11/30/18 for confusion and L sided weakness.  Neuro consulted for concern of TIA/stroke.  Workup is still pending.  MRI revealed multifocal acute ischemia R hemispheere, single punctate focus of sicehmia in L hemisphere and abnormal R ICA flow.  Pt with significant PMH of stroke, seizure, radiation, HTN, and esophagus CA.   Clinical Impression   Pt admitted with above diagnoses, with cognitive deficits and generalized weakness limiting ability to engage in BADL at desired level of ind. Pt is overall poor historian, spoke with sister [name] for collateral. [name] reports that pt was living in retirement home with meals taken care of. Pt began getting inconsistent with medication despite reminders from her family. She was also showing up to events not dressed as she normally would for the occasion. At time of eval pt is min guard for bed mobility, OOB tasks deferred 2/2 to pt being taken to MRI. Suspected cognitive deficits, but will continue to assess as pt available. Per [name] her and her sisters are touring an ALF for pt to leave to after hospital admission. Recommend HHOT to new ALF to continue to facilitate ind BADL in new environment. Will continue to follow while acute.     Follow Up Recommendations  Home health OT;Supervision/Assistance - 24 hour(per dtr going to ALF)    Equipment Recommendations  Other (comment)(defer to ALF)    Recommendations for Other Services       Precautions / Restrictions Precautions Precautions: Fall Restrictions Weight Bearing Restrictions: No      Mobility Bed Mobility Overal bed mobility: Needs Assistance Bed Mobility: Supine to Sit     Supine to sit: Min guard     General bed mobility comments: min guard for safety  Transfers                 General  transfer comment: deferred 2/2 pt being taken to MRI    Balance                                           ADL either performed or assessed with clinical judgement   ADL Overall ADL's : Needs assistance/impaired Eating/Feeding: Set up;Sitting   Grooming: Minimal assistance;Standing;Sitting   Upper Body Bathing: Minimal assistance;Sitting   Lower Body Bathing: Minimal assistance;Sit to/from stand;Sitting/lateral leans   Upper Body Dressing : Minimal assistance;Sitting   Lower Body Dressing: Minimal assistance;Sitting/lateral leans;Sit to/from stand   Toilet Transfer: Minimal assistance;Regular Toilet;Grab bars;RW   Toileting- Water quality scientist and Hygiene: Min guard   Tub/ Shower Transfer: Minimal assistance;Shower seat;3 in 1   Functional mobility during ADLs: Min guard;Rolling walker General ADL Comments: pt overall limited 2/2 to cognitive issues and decreased activity tol     Vision Patient Visual Report: No change from baseline Additional Comments: pt reports no changes at this time, appeared functional t/o eval     Perception     Praxis      Pertinent Vitals/Pain Pain Assessment: No/denies pain     Hand Dominance     Extremity/Trunk Assessment Upper Extremity Assessment Upper Extremity Assessment: Difficult to assess due to impaired cognition(strength overall okay, difficulty with arousal/attn)   Lower Extremity Assessment Lower Extremity Assessment: Defer to PT evaluation       Communication  Communication Communication: No difficulties   Cognition Arousal/Alertness: Awake/alert Behavior During Therapy: WFL for tasks assessed/performed Overall Cognitive Status: Impaired/Different from baseline Area of Impairment: Orientation;Attention;Memory;Following commands;Safety/judgement;Awareness;Problem solving                 Orientation Level: Disoriented to;Place;Time;Situation Current Attention Level: Sustained Memory:  Decreased recall of precautions;Decreased short-term memory Following Commands: Follows one step commands with increased time Safety/Judgement: Decreased awareness of deficits;Decreased awareness of safety Awareness: Intellectual Problem Solving: Slow processing;Requires verbal cues General Comments: pt unaware of why she is in the hospital, is overall poor historian. Per pt sister [name] pt has been dressing inapporpiately for situation, forgetting to take medicine even with reminders, not showering consistently, etc. Pt has no recall of these events   General Comments       Exercises     Shoulder Instructions      Home Living Family/patient expects to be discharged to:: Other (Comment)(Retirement home)                                 Additional Comments: Per pt sister [name], pt living in retirement home- fully accessible      Prior Functioning/Environment Level of Independence: Needs assistance    ADL's / Homemaking Assistance Needed: Pt has meal support at retirement home. She recently has had medicine compliance issues, as well as not consistently showering/dressing appropriately            OT Problem List: Decreased strength;Decreased knowledge of use of DME or AE;Decreased coordination;Decreased activity tolerance;Decreased cognition;Impaired UE functional use;Impaired balance (sitting and/or standing);Decreased safety awareness;Decreased knowledge of precautions      OT Treatment/Interventions: Self-care/ADL training;Therapeutic exercise;Patient/family education;Neuromuscular education;Balance training;Energy conservation;Therapeutic activities;DME and/or AE instruction;Cognitive remediation/compensation    OT Goals(Current goals can be found in the care plan section) Acute Rehab OT Goals Patient Stated Goal: to go home OT Goal Formulation: With patient Time For Goal Achievement: 12/15/18 Potential to Achieve Goals: Good  OT Frequency: Min 2X/week    Barriers to D/C:            Co-evaluation              AM-PAC OT "6 Clicks" Daily Activity     Outcome Measure Help from another person eating meals?: None Help from another person taking care of personal grooming?: A Little Help from another person toileting, which includes using toliet, bedpan, or urinal?: A Little Help from another person bathing (including washing, rinsing, drying)?: A Little Help from another person to put on and taking off regular upper body clothing?: A Little Help from another person to put on and taking off regular lower body clothing?: A Little 6 Click Score: 19   End of Session Nurse Communication: Mobility status  Activity Tolerance: Patient tolerated treatment well Patient left: in bed;with call bell/phone within reach;with bed alarm set;Other (comment)(on way to MRI)  OT Visit Diagnosis: Unsteadiness on feet (R26.81);Other symptoms and signs involving cognitive function                Time: [id number] OT Time Calculation (min): 11 min Charges:  OT General Charges $OT Visit: 1 Visit OT Evaluation $OT Eval Moderate Complexity: 1 Mod  Zenovia Jarred, MSOT, OTR/L Behavioral Health OT/ Acute Relief OT [facility] Office: [place] 12/01/2018, 3:03 PM

## 2018-12-01 NOTE — TOC Initial Note (Signed)
Transition of Care [facility]) - Initial/Assessment Note    Patient Details  Name: Sheila Young MRN: [id number] Date of Birth: 09/21/1950  Transition of Care [facility]) CM/SW Contact:    Pollie Friar, RN Phone Number: 12/01/2018, 1:41 PM  Clinical Narrative:                 Pt from a senior apartment. She lives alone but sister helps out with transportation and picking up her meds.  Awaiting PT/OT evals. CM following for d/c needs.   Expected Discharge Plan: Home/Self Care Barriers to Discharge: Continued Medical Work up   Patient Goals and CMS Choice        Expected Discharge Plan and Services Expected Discharge Plan: Home/Self Care       Living arrangements for the past 2 months: Apartment                                      Prior Living Arrangements/Services Living arrangements for the past 2 months: Apartment Lives with:: Self Patient language and need for interpreter reviewed:: Yes(no needs) Do you feel safe going back to the place where you live?: Yes            Criminal Activity/Legal Involvement Pertinent to Current Situation/Hospitalization: No - Comment as needed  Activities of Daily Living      Permission Sought/Granted                  Emotional Assessment Appearance:: Appears stated age Attitude/Demeanor/Rapport: Engaged Affect (typically observed): Accepting, Pleasant Orientation: : Oriented to Self, Oriented to Place, Oriented to  Time, Oriented to Situation   Psych Involvement: No (comment)  Admission diagnosis:  Altered mental status, unspecified altered mental status type [R41.82] Patient Active Problem List   Diagnosis Date Noted  . Acute metabolic encephalopathy [id number]  . CVA (cerebral vascular accident) ([place]) 12/01/2018  . TIA (transient ischemic attack) 11/30/2018  . Thrombocytopenia ([place]) 10/19/2017  . Atrial flutter ([place]) 10/17/2017  . Hyperaldosteronism ([place]) 10/17/2017  . Seizure ([place]) 10/15/2017  . Aspiration  pneumonia of both lower lobes due to gastric secretions ([place]) 10/15/2017  . Acute hypoxemic respiratory failure ([place]) 10/15/2017  . Rectal bleeding   . Acute blood loss anemia   . Platelet inhibition due to Plavix   . Diverticulosis of colon with hemorrhage   . GI bleed 09/03/2017  . Dysphagia   . Protein-calorie malnutrition, severe ([place]) 04/09/2014  . Esophagus cancer ([place]) 04/07/2014  . Mechanical dysphagia 04/07/2014  . Esophageal cancer ([place]) 04/07/2014   PCP:  Beckie Salts, MD Pharmacy:   [facility] [place] [address] [place] [address] Phone: [phone] Fax: [facility] [phone] - [place], [place] - [address] AT [place] [address] [place] [address] Phone: [phone] Fax: [phone]     Social Determinants of Health (SDOH) Interventions    Readmission Risk Interventions No flowsheet data found.

## 2018-12-01 NOTE — Progress Notes (Signed)
SLP Cancellation Note  Patient Details Name: Jesikah Gouger MRN: [id number] DOB: February 10, 1951   Cancelled treatment:        Pt out of room when swallow assessment attempted. Will continue efforts today.   [name] 12/01/2018, 10:01 AM   [name].Ed Risk analyst [phone] Office [phone]

## 2018-12-01 NOTE — Progress Notes (Signed)
Called to speak with nurse pt is in MRI will check back later to see if it is a better time for EEG.

## 2018-12-01 NOTE — Progress Notes (Signed)
Initial Nutrition Assessment  DOCUMENTATION CODES:   Not applicable  INTERVENTION:  -Ensure Enlive po BID, each supplement provides 350 kcal and 20 grams of protein -MVI   NUTRITION DIAGNOSIS:   Predicted suboptimal nutrient intake related to chronic illness(esophageal cancer; hx of CVA; seizures) as evidenced by other (comment)(history of mechanical dysphagia; recent admission for aspiration pneumonia; pending SLP eval).  GOAL:   Patient will meet greater than or equal to 90% of their needs   MONITOR:   PO intake, Weight trends, Supplement acceptance, Labs, I & O's  REASON FOR ASSESSMENT:   Consult Assessment of nutrition requirement/status  ASSESSMENT:  RD working remotely.  68 year old female with medical history significant of CVA, seizures, esophageal cancer, mechanical dysphagia, hyperaldosteronism chronic anemia, medication non-compliance, recent admission for new onset A-fib with RVR and aspiration pneumonia who presented with continued confusion over the past few days, left-sided weakness, facial droop, and AMS.  CT shows left-sided parietal/occipital encephalomalacia; scattered chronic lacunar infarcts.Neurology notes strong suspicion of noncompliance to medications or possible combination of prolonged breakthrough seizure, toxic metabolic encephalopathy; Keppra BID, EEG tomorrow morning; MRI today  Unable to reach pt via phone. Per chart review, pt from a senior apartment. She lives alone; sister assists with transportation and picking up her medications. Sister reports that patient may have missed medications for the previous 4 days PTA  No documented meals at this time. RD to order Ensure Enlive to assist with calorie and protein needs. Will continue to monitor for SLP evaluation and recommendations.  Current wt 81 kg (178.2 lb) Wt history reviewed   NUTRITION - FOCUSED PHYSICAL EXAM: Unable to complete at this time   Diet Order:   Diet Order           Diet Heart Room service appropriate? Yes with Assist; Fluid consistency: Thin  Diet effective now              EDUCATION NEEDS:   No education needs have been identified at this time  Skin:  Skin Assessment: Reviewed RN Assessment  Last BM:  PTA  Height:   Ht Readings from Last 1 Encounters:  11/30/18 5\' 6"  (1.676 m)    Weight:   Wt Readings from Last 1 Encounters:  11/30/18 81 kg    Ideal Body Weight:  61.4 kg  BMI:  Body mass index is 28.82 kg/m.  Estimated Nutritional Needs:   Kcal:  1775-1900  Protein:  89-95  Fluid:  >1.7L   Lajuan Lines, RD, LDN [place] Telephone [phone] After Hours/Weekend Pager: [phone]

## 2018-12-01 NOTE — Evaluation (Signed)
Clinical/Bedside Swallow Evaluation Patient Details  Name: Sheila Young MRN: [id number] Date of Birth: [birth year]  Today's Date: 12/01/2018 Time: SLP Start Time (ACUTE ONLY): 1340 SLP Stop Time (ACUTE ONLY): 1400 SLP Time Calculation (min) (ACUTE ONLY): 20 min  Past Medical History:  Past Medical History:  Diagnosis Date  . Anger   . Esophagus cancer ([place]) 04/07/2014  . High aldosterone ([place])   . History of noncompliance with medical treatment   . Hyperaldosteronism ([place]) 10/17/2017  . Hypercholesteremia   . Hypertension   . Hypokalemia   . Mammogram abnormal   . Mechanical dysphagia 04/07/2014  . Radiation    54 Gy to distal esophagus  . Seizures ([place])   . Stroke [facility])    Past Surgical History:  Past Surgical History:  Procedure Laterality Date  . IR REMOVAL TUN ACCESS W/ PORT W/O FL MOD SED  02/18/2017   HPI:  Sheila Young is a 68 y.o. female with medical history of CVA; seizures; esophageal CA dysphagia, hyperaldosteronism chronic anemia. She was admitted with altered mental status and L side weakness. MRI Multifocal acute ischemia, predominantly within the right hemisphere, within multiple vascular territories, possibly embolic Single punctate focus of ischemia within the left hemisphere along the precentral gyrus, Multiple old infarcts and findings of chronic small vessel ischemia. Esophagram performed 06/25/17 showing slightly irregular stricture of the distal esophagus measuring up to 8 mm in maximal diameter, spanning a length of 3.6 cm, likely secondary to prior radiation. A 13 mm barium tablet would not pass through the stricture. 10/15/2017 MBS showed oral and pharyngeal swallow fx WNL. CXR 11/30/18 Stable mild cardiomegaly. No acute cardiopulmonary disease.    Assessment / Plan / Recommendation Clinical Impression  Patient presents with an oropharyngeal swallow that appears [facility], however she has h/o CVA and cognitive deficits. She did not exhibit any overt s/s of  aspiration or penetration with thin liquids or regular solids. She will benefit from at least one diet toleration check by SLP prior to discharge from SLP services. SLP Visit Diagnosis: Dysphagia, oropharyngeal phase (R13.12)    Aspiration Risk  Mild aspiration risk    Diet Recommendation Regular;Thin liquid   Liquid Administration via: Straw;Cup Medication Administration: Whole meds with liquid Supervision: Patient able to self feed;Intermittent supervision to cue for compensatory strategies Compensations: Minimize environmental distractions;Slow rate;Small sips/bites Postural Changes: Seated upright at 90 degrees    Other  Recommendations Oral Care Recommendations: Oral care BID   Follow up Recommendations None      Frequency and Duration min 1 x/week  1 week       Prognosis Prognosis for Safe Diet Advancement: Good Barriers to Reach Goals: Cognitive deficits      Swallow Study   General Date of Onset: 11/30/18 HPI: Sheila Young is a 68 y.o. female with medical history of CVA; seizures; esophageal CA dysphagia, hyperaldosteronism chronic anemia. She was admitted with altered mental status and L side weakness. MRI Multifocal acute ischemia, predominantly within the right hemisphere, within multiple vascular territories, possibly embolic Single punctate focus of ischemia within the left hemisphere along the precentral gyrus, Multiple old infarcts and findings of chronic small vessel ischemia. Esophagram performed 06/25/17 showing slightly irregular stricture of the distal esophagus measuring up to 8 mm in maximal diameter, spanning a length of 3.6 cm, likely secondary to prior radiation. A 13 mm barium tablet would not pass through the stricture. 10/15/2017 MBS showed oral and pharyngeal swallow fx WNL. CXR 11/30/18 Stable mild cardiomegaly. No acute cardiopulmonary disease.  Type of Study: Bedside Swallow Evaluation Previous Swallow Assessment: N/A Diet Prior to this Study:  NPO Temperature Spikes Noted: No Respiratory Status: Room air History of Recent Intubation: No Behavior/Cognition: Alert;Cooperative;Pleasant mood;Confused Oral Care Completed by SLP: No Oral Cavity - Dentition: Missing dentition;Adequate natural dentition Vision: Functional for self-feeding Self-Feeding Abilities: Able to feed self Patient Positioning: Upright in bed Baseline Vocal Quality: Normal Volitional Cough: Strong Volitional Swallow: Able to elicit    Oral/Motor/Sensory Function Overall Oral Motor/Sensory Function: Mild impairment Facial ROM: Within Functional Limits Facial Symmetry: Abnormal symmetry left(very mild) Facial Strength: Within Functional Limits Facial Sensation: Within Functional Limits Lingual ROM: Within Functional Limits Lingual Symmetry: Within Functional Limits Lingual Strength: Within Functional Limits Velum: Within Functional Limits Mandible: Within Functional Limits   Ice Chips     Thin Liquid Thin Liquid: Within functional limits Presentation: Straw Other Comments: No overt s/s of aspiration or penetration. Laryngeal elevation and pharyngeal contraction both WFL per palpation.    Nectar Thick   NT  Honey Thick   NT  Puree Puree: Not tested   Solid     Solid: Within functional limits Other Comments: No overt s/s of difficulty with mastication or swallow initiation with regular solids.      Nadara Mode Tarrell 12/01/2018,4:39 PM  Sonia Baller, [place], [place] 12/01/18 4:39 PM Phone: [phone] Fax: [phone]

## 2018-12-01 NOTE — Consult Note (Addendum)
Neurology Consultation  Reason for Consult: Altered mental status Referring Physician: Dr. Roel Cluck  CC: Altered mental status  History is obtained from: Patient, chart review.  No family at bedside at this time  HPI: Sheila Young is a 68 y.o. female past medical history of esophageal cancer, atrial flutter, on anticoagulation with Eliquis, status post treatment with Taxol/carboplatin and status post radiation, continuing dysphagia, hypertension, hypercholesterolemia, documented history of stroke with unclear residual deficits, history of seizures on Keppra with a strong suspicion for noncompliance of medications that she lives by herself and has had memory issues over the past few months to years, brought in to [facility] for evaluation of left-sided weakness and altered mental status.  Presumably she was last normal sometime on 11/29/2018 and then noted by family members to be dragging her left foot while walking.  The patient denies this when I asked her in the hospital about her left-sided weakness.  She says she was brought in to the emergency room because she had fever on the day prior to presentation and her sister brought her in. Review of the chart-ED notes, pertinent for altered mental status and some left-sided weakness with no clear cause apparent in the ER and medicine admission requested for work-up for altered mental status and some question of TIA. Patient unable to provide reliable history at this time.  No family at bedside.   LKW: Sometime on 11/29/2018 tpa given?: no, outside the window Premorbid modified Rankin scale (mRS): I would imagine this to be 0-1 as she lives by herself- no family member to corroborate. ROS: Attempted to obtain review of systems, pertinent positives in the HPI, unreliable historian.  Past Medical History:  Diagnosis Date  . Anger   . Esophagus cancer ([place]) 04/07/2014  . 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 History of noncompliance with medical  treatment   . Hyperaldosteronism ([place]) 10/17/2017  . Hypercholesteremia   . Hypertension   . Hypokalemia   . Mammogram abnormal   . Mechanical dysphagia 04/07/2014  . Radiation    54 Gy to distal esophagus  . Seizures ([place])   . Stroke [facility])     Family History  Problem Relation Age of Onset  . GI Bleed Neg Hx   . GI Disease Neg Hx   . Stroke Neg Hx     Social History:   reports that she has been smoking cigarettes. She started smoking about 50 years ago. She has a 22.50 pack-year smoking history. She has never used smokeless tobacco. She reports that she does not drink alcohol or use drugs.  Medications  Current Facility-Administered Medications:  .   stroke: mapping our early stages of recovery book, , Does not apply, Once, Doutova, Anastassia, MD .  0.9 %  sodium chloride infusion, , Intravenous, Continuous, Doutova, Anastassia, MD .  acetaminophen (TYLENOL) tablet 650 mg, 650 mg, Oral, Q4H PRN **OR** acetaminophen (TYLENOL) solution 650 mg, 650 mg, Per Tube, Q4H PRN **OR** acetaminophen (TYLENOL) suppository 650 mg, 650 mg, Rectal, Q4H PRN, Doutova, Anastassia, MD .  apixaban (ELIQUIS) tablet 5 mg, 5 mg, Oral, BID, Doutova, Anastassia, MD .  FLUoxetine (PROZAC) capsule 10 mg, 10 mg, Oral, Daily, Doutova, Anastassia, MD .  levETIRAcetam (KEPPRA) tablet 500 mg, 500 mg, Oral, BID, Doutova, Anastassia, MD .  pantoprazole (PROTONIX) EC tablet 40 mg, 40 mg, Oral, Daily, Doutova, Anastassia, MD .  senna-docusate (Senokot-S) tablet 1 tablet, 1 tablet, Oral, QHS PRN, Doutova, Anastassia, MD .  spironolactone (ALDACTONE) tablet 50  mg, 50 mg, Oral, Daily, Doutova, Anastassia, MD   Exam: Current vital signs: BP 137/61 (BP Location: Right Arm)   Pulse (!) 59   Temp 98 F (36.7 C) (Oral)   Resp 18   Ht 5\' 6"  (1.676 m)   Wt 81 kg   SpO2 98%   BMI 28.82 kg/m  Vital signs in last 24 hours: Temp:  [97.9 F (36.6 C)-98.1 F (36.7 C)] 98 F (36.7 C) (08/24 2219) Pulse Rate:   [49-64] 59 (08/24 2219) Resp:  [11-20] 18 (08/24 2219) BP: (123-148)/(60-92) 137/61 (08/24 2219) SpO2:  [85 %-100 %] 98 % (08/24 2219) Weight:  [81 kg] 81 kg (08/24 2219) General: Sleeping in bed, woke to voice, follows commands, in no apparent distress but very unhappy that she was woken up at this time for examination. HEENT: Normocephalic atraumatic Lungs: Clear to auscultation Cardiovascular: S1-2 heard regular rate and Abdomen: Soft nondistended nontender Extremities: Warm well perfused Neurological exam Awake alert oriented to self.  Could not tell me the date or month.  She told me she is in the hospital but could not tell me the name of the hospital. Her speech was non-dysarthric.  Attention concentration is poor. Naming, comprehension and repetition are intact within the constraints of poor attention concentration. Cranial nerves: Pupils equal round react light, extraocular movements intact, visual fields are full to threat, face appears symmetric, facial sensation intact. Motor exam: No drift noted in any of the 4 extremities.  She was not cooperative with individual muscle testing. Sensory exam: Grossly intact light touch Coordination: Did not cooperate with performing finger-nose-finger testing or heel-knee-shin testing. Gait testing was deferred at this time. NIH stroke scale 1a Level of Conscious.: 0 1b LOC Questions: 1 1c LOC Commands: 0 2 Best Gaze: 0 3 Visual: 0 4 Facial Palsy: 0 5a Motor Arm - left: 0 5b Motor Arm - Right: 0 6a Motor Leg - Left: 0 6b Motor Leg - Right: 0 7 Limb Ataxia: 0 8 Sensory: 0 9 Best Language: 0 10 Dysarthria: 0 11 Extinct. and Inatten.: 0 TOTAL: 1    Labs I have reviewed labs in epic and the results pertinent to this consultation are: CBC    Component Value Date/Time   WBC 11.2 (H) 11/30/2018 1149   RBC 3.89 11/30/2018 1149   HGB 11.3 (L) 11/30/2018 1149   HGB 11.3 (L) 11/24/2018 1319   HGB 10.8 (L) 02/04/2017 1358   HGB  13.2 09/12/2014 0920   HCT 36.3 11/30/2018 1149   HCT 33.4 (L) 02/04/2017 1358   HCT 38.4 09/12/2014 0920   PLT 243 11/30/2018 1149   PLT 243 11/24/2018 1319   PLT 358 02/04/2017 1358   PLT 235 09/12/2014 0920   MCV 93.3 11/30/2018 1149   MCV 90 02/04/2017 1358   MCV 86.3 09/12/2014 0920   MCH 29.0 11/30/2018 1149   MCHC 31.1 11/30/2018 1149   RDW 14.8 11/30/2018 1149   RDW 15.9 (H) 02/04/2017 1358   RDW 15.8 (H) 09/12/2014 0920   LYMPHSABS 1.5 11/30/2018 1149   LYMPHSABS 2.3 02/04/2017 1358   LYMPHSABS 1.3 09/12/2014 0920   MONOABS 0.9 11/30/2018 1149   MONOABS 0.8 09/12/2014 0920   EOSABS 0.3 11/30/2018 1149   EOSABS 0.5 02/04/2017 1358   BASOSABS 0.1 11/30/2018 1149   BASOSABS 0.0 02/04/2017 1358   BASOSABS 0.0 09/12/2014 0920    CMP     Component Value Date/Time   NA 140 11/30/2018 1149   NA 142 02/04/2017 1358  NA 143 06/18/2016 1118   K 3.6 11/30/2018 1149   K 3.9 02/04/2017 1358   K 2.9 (LL) 06/18/2016 1118   CL 108 11/30/2018 1149   CL 103 02/04/2017 1358   CL 99 07/24/2015 0933   CO2 22 11/30/2018 1149   CO2 27 02/04/2017 1358   CO2 29 06/18/2016 1118   GLUCOSE 99 11/30/2018 1149   GLUCOSE 89 06/18/2016 1118   GLUCOSE 117 07/24/2015 0933   BUN 39 (H) 11/30/2018 1149   BUN 22 02/04/2017 1358   BUN 23.4 06/18/2016 1118   CREATININE 1.94 (H) 11/30/2018 1149   CREATININE 1.85 (H) 11/24/2018 1319   CREATININE 1.19 (H) 02/04/2017 1358   CREATININE 1.1 06/18/2016 1118   CALCIUM 9.7 11/30/2018 1149   CALCIUM 10.4 (H) 02/04/2017 1358   CALCIUM 9.7 06/18/2016 1118   PROT 6.9 11/30/2018 1149   PROT 8.0 02/04/2017 1358   PROT 7.6 06/18/2016 1118   ALBUMIN 3.6 11/30/2018 1149   ALBUMIN 3.9 02/04/2017 1358   ALBUMIN 3.7 06/18/2016 1118   AST 15 11/30/2018 1149   AST 13 (L) 11/24/2018 1319   AST 17 06/18/2016 1118   ALT 14 11/30/2018 1149   ALT 10 11/24/2018 1319   ALT 14 06/18/2016 1118   ALKPHOS 62 11/30/2018 1149   ALKPHOS 166 (H) 02/04/2017 1358    ALKPHOS 115 06/18/2016 1118   BILITOT 0.6 11/30/2018 1149   BILITOT 0.4 11/24/2018 1319   BILITOT 0.44 06/18/2016 1118   GFRNONAA 26 (L) 11/30/2018 1149   GFRNONAA 28 (L) 11/24/2018 1319   GFRAA 30 (L) 11/30/2018 1149   GFRAA 32 (L) 11/24/2018 1319    Lipid Panel     Component Value Date/Time   CHOL 113 04/08/2014 0355   TRIG 225 (H) 04/08/2014 0355   HDL 25 (L) 04/08/2014 0355   CHOLHDL 4.5 04/08/2014 0355   VLDL 45 (H) 04/08/2014 0355   LDLCALC 43 04/08/2014 0355  Ammonia 18 Urinalysis negative    Imaging I have reviewed the images obtained: Noncontrast head CT shows left-sided parietal/occipital encephalomalacia.  Scattered chronic lacunar infarcts.  No acute bleed.   Assessment: 68 year old with atrial flutter on Eliquis, history of stroke with unknown residual deficits, history of seizures, history of esophageal cancer status post chemo and radiation, coming in with concerns by family for altered mental status and possible left-sided weakness. Patient denies any weakness but says she was brought in because of a fever. Neurological consultation obtained for TIA and altered mental status concern. On my examination, the patient has poor attention concentration, and unable to answer some orientation questions but remains nonfocal in terms of her speech, cranial nerves, motor or sensory exam. Labs reveal elevated creatinine-worse than baseline. There is strong suspicion of noncompliance to medications. Putting all this together, my suspicion is this is possibly 1 or combination of: - Breakthrough seizure with prolonged postictal state -Toxic metabolic encephalopathy - Embolic strokes-outside the window for IV TPA or intervention given last known normal was nearly a day and a half ago. - Low on the differentials but a possibility is brain metastases  Recommendations:  For history of seizures and concern for breakthrough seizures & non comopliance: -Continue Keppra 500 twice  daily AFTER drawing lab for Keppra level (no load documented in ER - would like to check level as it can help ascertain compliance - RN notified to hold dose till after lab draw) -Ativan for any seizure lasting more than 5 minutes -Routine EEG in the morning -Maintain seizure  precautions  For concern for toxic metabolic encephalopathy: -Correction of toxic metabolic derangements per primary team as you are -Check B12 levels and TSH  For concern for embolic stroke or metastases (and also for TIA although I am unclear how that came about since I do not have full story from the sister or family members): -MRI brain without contrast- if concern for metastases, can follow this with a with contrast scan but given her deranged renal function, I would not give her gadolinium just about yet. -Stroke/TIA work-up has been ordered by the primary team.  Do not see a need for vessel imaging unless the MRI shows strokes.  Will hold off on vessel imaging. -Noncontrast head CT unremarkable for new stroke   I have discussed my plan over the phone with Dr. Roel Cluck. Neurology will follow with you.  -- Amie Portland, MD [facility] Pager: [phone] If 7pm to 7am, please call on call as listed on AMION.  ADDENDUM MRI with bilteral embolic looking strokes. Worse on right hemisphere, and one on left - right sided strokes could be simply cardioembolic but could also be watershed.  UPDATED RECS IN ADDITION TO ABOVE RECS: -Will get vessel studies. -Also will d/c Eliquis for now and resume after all tests completed. Stroke team to follow -Aspirin for now.  -- Amie Portland, MD [facility] Pager: [phone] If 7pm to 7am, please call on call as listed on AMION.

## 2018-12-01 NOTE — Progress Notes (Signed)
PROGRESS NOTE    Sheila Young  [id number] DOB: Aug 07, 1950 DOA: 11/30/2018 PCP: Beckie Salts, MD    Brief Narrative:  68 year old female with history of stroke, cognitive dysfunction, he should present carcinoma with dysphagia, hyperaldosteronism and chronic anemia, chronic kidney disease stage III presented to the hospital on sister's request who noticed that she has been more slow to respond for last few days.  Patient does have history of seizure.  She does have cognitive dysfunction and might have problem of taking medications.  1 of the sister thinks she might have missed medications for last 4 days.  Patient is recently diagnosed with A. fib and she is on Eliquis for that.   Assessment & Plan:   Active Problems:   Esophagus cancer (HCC)   Mechanical dysphagia   Protein-calorie malnutrition, severe (HCC)   Seizure (HCC)   Atrial flutter (HCC)   Hyperaldosteronism (HCC)   TIA (transient ischemic attack)   Acute metabolic encephalopathy   CVA (cerebral vascular accident) ([place])  Acute ischemic stroke, multiple territory possibly embolic: With acute metabolic encephalopathy in the setting of underlying chronic cognitive dysfunction.  Clinical findings, more confusion than usual. CT head findings, normal. MRI of the brain, multiple areas of ischemia likely embolic source.  MRA of the head and neck shows complete right ICA blockage.   2D echocardiogram, pending. Antiplatelet therapy, on Eliquis at home, noncompliant.  Currently on aspirin. LDL 121, on atorvastatin 40 mg at home, with increased to 80 mg daily.   Hemoglobin A1c, 6.1 DVT prophylaxis, Eliquis Therapy recommendations, pending Followed by neurology, speech, PT OT.  Seizure disorder: On Keppra at home.  No evidence of new seizure.  Keppra continued.  Levels pending.  Paroxysmal A. Fib: Currently rate controlled sinus rhythm.  On metoprolol that she will resume.  Eliquis held today.  Vascular dementia: Lives  at senior living apartment.  Now she will be going to assisted living place.  Acute kidney injury on chronic kidney disease stage III: Baseline creatinine about 1.2.  Continue IV fluids and recheck levels in the morning.   Discussed and updated patient's sister Ms. [name], as patient has multiple sisters.  According to the sisters, they have found an assisted living facility for the patient to go from the hospital.  She has been at senior living apartment but they doubt compliance but the medications and supervision.  DVT prophylaxis: Eliquis Code Status: Full code Family Communication: Sister [name] Disposition Plan: Assisted living facility with PT OT   Consultants:   Neurology  Procedures:   None  Antimicrobials:   None   Subjective: Patient seen and examined.  Poor historian.  She denies any complaints.  No other overnight events.  Objective: Vitals:   12/01/18 0019 12/01/18 0311 12/01/18 0739 12/01/18 1144  BP: 140/65 137/66 124/83 (!) 143/60  Pulse: (!) 57 (!) 59 (!) 53 (!) 49  Resp: 18 16 20 16   Temp: 98.2 F (36.8 C) 98 F (36.7 C) 98 F (36.7 C) 98.1 F (36.7 C)  TempSrc: Oral Oral Oral Oral  SpO2: 98% 99% 100% 99%  Weight:      Height:        Intake/Output Summary (Last 24 hours) at 12/01/2018 1357 Last data filed at 12/01/2018 0600 Gross per 24 hour  Intake 249.34 ml  Output 300 ml  Net -50.66 ml   Filed Weights   11/30/18 2219  Weight: 81 kg    Examination:  General exam: Appears calm and comfortable  Respiratory system: Clear  to auscultation. Respiratory effort normal. Cardiovascular system: S1 & S2 heard, RRR. No JVD, murmurs, rubs, gallops or clicks. No pedal edema. Gastrointestinal system: Abdomen is nondistended, soft and nontender. No organomegaly or masses felt. Normal bowel sounds heard. Central nervous system: Alert and oriented x2. No focal neurological deficits. Extremities: Symmetric 5 x 5 power. Skin: No rashes, lesions or  ulcers Psychiatry: Judgement and insight appear normal. Mood & affect appropriate.     Data Reviewed: I have personally reviewed following labs and imaging studies  CBC: Recent Labs  Lab 11/30/18 1149  WBC 11.2*  NEUTROABS 8.3*  HGB 11.3*  HCT 36.3  MCV 93.3  PLT 0000000   Basic Metabolic Panel: Recent Labs  Lab 11/30/18 1149  NA 140  K 3.6  CL 108  CO2 22  GLUCOSE 99  BUN 39*  CREATININE 1.94*  CALCIUM 9.7   GFR: Estimated Creatinine Clearance: 30.2 mL/min (A) (by C-G formula based on SCr of 1.94 mg/dL (H)). Liver Function Tests: Recent Labs  Lab 11/30/18 1149  AST 15  ALT 14  ALKPHOS 62  BILITOT 0.6  PROT 6.9  ALBUMIN 3.6   No results for input(s): LIPASE, AMYLASE in the last 168 hours. Recent Labs  Lab 11/30/18 1232  AMMONIA 18   Coagulation Profile: Recent Labs  Lab 11/30/18 1149  INR 1.3*   Cardiac Enzymes: No results for input(s): CKTOTAL, CKMB, CKMBINDEX, TROPONINI in the last 168 hours. BNP (last 3 results) No results for input(s): PROBNP in the last 8760 hours. HbA1C: Recent Labs    12/01/18 0217  HGBA1C 6.1*   CBG: Recent Labs  Lab 11/30/18 1214  GLUCAP 82   Lipid Profile: Recent Labs    12/01/18 0217  CHOL 175  HDL 30*  LDLCALC 121*  TRIG 119  CHOLHDL 5.8   Thyroid Function Tests: No results for input(s): TSH, T4TOTAL, FREET4, T3FREE, THYROIDAB in the last 72 hours. Anemia Panel: Recent Labs    12/01/18 0327  VITAMINB12 590  FOLATE 14.0  FERRITIN 115  TIBC 276  IRON 52  RETICCTPCT 1.7   Sepsis Labs: No results for input(s): PROCALCITON, LATICACIDVEN in the last 168 hours.  Recent Results (from the past 240 hour(s))  SARS CORONAVIRUS 2 (TAT 6-12 HRS) Nasal Swab Aptima Multi Swab     Status: None   Collection Time: 11/30/18  2:01 PM   Specimen: Aptima Multi Swab; Nasal Swab  Result Value Ref Range Status   SARS Coronavirus 2 NEGATIVE NEGATIVE Final    Comment: (NOTE) SARS-CoV-2 target nucleic acids are NOT  DETECTED. The SARS-CoV-2 RNA is generally detectable in upper and lower respiratory specimens during the acute phase of infection. Negative results do not preclude SARS-CoV-2 infection, do not rule out co-infections with other pathogens, and should not be used as the sole basis for treatment or other patient management decisions. Negative results must be combined with clinical observations, patient history, and epidemiological information. The expected result is Negative. Fact Sheet for Patients: SugarRoll.be Fact Sheet for Healthcare Providers: https://www.woods-mathews.com/ This test is not yet approved or cleared by the [place] FDA and  has been authorized for detection and/or diagnosis of SARS-CoV-2 by FDA under an Emergency Use Authorization (EUA). This EUA will remain  in effect (meaning this test can be used) for the duration of the COVID-19 declaration under Section 56 4(b)(1) of the Act, 21 U.S.C. section 360bbb-3(b)(1), unless the authorization is terminated or revoked sooner. Performed at [facility], [address] [place],  [place] [address]          Radiology Studies: Ct Head Wo Contrast  Result Date: 11/30/2018 CLINICAL DATA:  Onset left-sided weakness yesterday. EXAM: CT HEAD WITHOUT CONTRAST TECHNIQUE: Contiguous axial images were obtained from the base of the skull through the vertex without intravenous contrast. COMPARISON:  Head CT scan 07/21/2018.  Brain MRI 05/08/2018. FINDINGS: Brain: No evidence of acute infarction, hemorrhage, hydrocephalus, extra-axial collection or mass lesion/mass effect. 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 Scattered lacunar infarctions including involvement of the basal ganglia, thalami and right corona radiata also again seen. Vascular: No hyperdense vessel or unexpected calcification. Skull: Intact.  No focal lesion. Sinuses/Orbits: Negative. Other: None. IMPRESSION: No  acute abnormality. Multiple remote infarcts, unchanged. Electronically Signed   By: Inge Rise M.D.   On: 11/30/2018 12:29   Mr Angio Head Wo Contrast  Result Date: 12/01/2018 CLINICAL DATA:  Confusion.  Stroke, follow-up. EXAM: MRA NECK WITHOUT CONTRAST MRA HEAD WITHOUT CONTRAST TECHNIQUE: Multiplanar and multiecho pulse sequences of the neck were obtained without intravenous contrast. Angiographic images of the neck were obtained using MRA technique without intravenous contast.; Angiographic images of the Circle of Willis were obtained using MRA technique without intravenous contrast. COMPARISON:  None. FINDINGS: MRA NECK FINDINGS Time-of-flight images demonstrate at fluid disruption of the proximal right internal carotid artery at the bifurcation. There is no distal reconstituted flow visible by time-of-flight imaging. The left common carotid artery and bifurcation is normal. Vertebral arteries originate from the subclavian arteries. There is antegrade flow in both vertebral arteries. No significant flow disturbance is present. MRA HEAD FINDINGS Time-of-flight images demonstrate no significant flow within the right internal carotid artery from the skull base through the ICA terminus. There is some irregularity in the left cavernous internal carotid artery without a significant stenosis relative to the more distal vessel. A1 and M1 segments demonstrate flow bilaterally. ACA vessels are normal bilaterally. There is some irregularity of MCA branch vessels. This may be artifactual. No significant proximal stenosis or occlusion is present. The vertebral arteries are hypoplastic bilaterally. The left vertebral artery terminates at the PICA. There is decreased flow signal within the basilar artery. PCA branch vessels are not well seen. Prominent left posterior communicating artery is present. 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 IMPRESSION: 1. No flow visible in the right internal carotid artery from the  bifurcation to the ICA terminus. 2. Normal flow in the left carotid artery through the ICA terminus. 3. Poor visualization of the posterior circulation on the MRA circle-of-Willis. The vessels appear hypoplastic. 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 4. 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 Electronically Signed   By: San Morelle M.D.   On: 12/01/2018 12:21   Mr Angio Neck Wo Contrast  Result Date: 12/01/2018 CLINICAL DATA:  Confusion.  Stroke, follow-up. EXAM: MRA NECK WITHOUT CONTRAST MRA HEAD WITHOUT CONTRAST TECHNIQUE: Multiplanar and multiecho pulse sequences of the neck were obtained without intravenous contrast. Angiographic images of the neck were obtained using MRA technique without intravenous contast.; Angiographic images of the Circle of Willis were obtained using MRA technique without intravenous contrast. COMPARISON:  None. FINDINGS: MRA NECK FINDINGS Time-of-flight images demonstrate at fluid disruption of the proximal right internal carotid artery at the bifurcation. There is no distal reconstituted flow visible by time-of-flight imaging. The left common carotid artery and bifurcation is normal. Vertebral arteries originate from the subclavian arteries. There is antegrade flow in both vertebral arteries. No significant flow disturbance is present. MRA HEAD FINDINGS Time-of-flight  images demonstrate no significant flow within the right internal carotid artery from the skull base through the ICA terminus. There is some irregularity in the left cavernous internal carotid artery without a significant stenosis relative to the more distal vessel. A1 and M1 segments demonstrate flow bilaterally. ACA vessels are normal bilaterally. There is some irregularity of MCA branch vessels. This may be artifactual. No significant proximal stenosis or occlusion is present. The vertebral arteries are hypoplastic bilaterally. The left vertebral artery terminates at the PICA. There is decreased  flow signal within the basilar artery. PCA branch vessels are not well seen. Prominent left posterior communicating artery is present. 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 IMPRESSION: 1. No flow visible in the right internal carotid artery from the bifurcation to the ICA terminus. 2. Normal flow in the left carotid artery through the ICA terminus. 3. Poor visualization of the posterior circulation on the MRA circle-of-Willis. The vessels appear hypoplastic. 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 4. 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 Electronically Signed   By: San Morelle M.D.   On: 12/01/2018 12:21   Mr Brain Wo Contrast  Result Date: 12/01/2018 CLINICAL DATA:  Focal neurologic deficit. History of esophageal cancer. EXAM: MRI HEAD WITHOUT CONTRAST TECHNIQUE: Multiplanar, multiecho pulse sequences of the brain and surrounding structures were obtained without intravenous contrast. COMPARISON:  Head CT 11/30/2018 and brain MRI 05/08/2018 FINDINGS: BRAIN: There are multiple small foci of abnormal diffusion restriction within the right hemisphere, within the MCA and PCA territories. There is a single punctate focus of diffusion restriction in the left hemisphere near the hand motor area. There is an old infarct of the left PCA territory with associated encephalomalacia. Multiple old small vessel infarcts on the right. Multifocal white matter hyperintensity, most commonly due to chronic ischemic microangiopathy. The cerebral and cerebellar volume are age-appropriate. There is no hydrocephalus. The midline structures are normal. VASCULAR: Susceptibility-sensitive sequences show no chronic microhemorrhage or superficial siderosis. Abnormal right ICA flow void at the skull base. SKULL AND UPPER CERVICAL SPINE: Calvarial bone marrow signal is normal. There is no skull base mass. The visualized upper cervical spine and soft tissues are normal. SINUSES/ORBITS: There are no fluid levels or  advanced mucosal thickening. The mastoid air cells and middle ear cavities are free of fluid. The orbits are normal. IMPRESSION: 1. Multifocal acute ischemia, predominantly within the right hemisphere, within multiple vascular territories, possibly embolic. 2. Single punctate focus of ischemia within the left hemisphere along the left precentral gyrus. 3. Abnormal right ICA flow void at the skull base which may indicate slow flow or occlusion. CTA or MRA may be helpful for further characterization. 4. Multiple old infarcts and findings of chronic small vessel ischemia. Electronically Signed   By: Ulyses Jarred M.D.   On: 12/01/2018 01:48   Dg Chest Portable 1 View  Result Date: 11/30/2018 CLINICAL DATA:  68 year old with acute onset of LEFT body weakness yesterday. Today the patient was dragging her LEFT leg while walking. Personal history of stroke and seizures. EXAM: PORTABLE CHEST 1 VIEW COMPARISON:  07/21/2018 and earlier. FINDINGS: Cardiac silhouette mildly enlarged for AP portable technique, unchanged. Lungs clear. Bronchovascular markings normal. Pulmonary vascularity normal. No visible pleural effusions. No pneumothorax. IMPRESSION: Stable mild cardiomegaly. No acute cardiopulmonary disease. Electronically Signed   By: Evangeline Dakin M.D.   On: 11/30/2018 12:48        Scheduled Meds:  aspirin  325 mg Oral Daily   atorvastatin  40 mg Oral q1800  FLUoxetine  10 mg Oral Daily   levETIRAcetam  500 mg Oral BID   pantoprazole  40 mg Oral Daily   spironolactone  50 mg Oral Daily   Continuous Infusions:  sodium chloride 75 mL/hr at 12/01/18 0600     LOS: 0 days    Time spent: 25 minutes     Barb Merino, MD [facility] Pager [phone]  If 7PM-7AM, please contact night-coverage [email or web address] Password [email or web address] 12/01/2018, 1:57 PM

## 2018-12-01 NOTE — Procedures (Signed)
Patient Name: Alysa Chrysler  MRN: [id number]  Epilepsy Attending: Lora Havens  Referring Physician/Provider: Burnetta Sabin, NP Date: 12/01/2018 Duration: 25.28 mins  Patient history: 68 year old female with CVA and seizure disorder who presented with left-sided weakness and altered mental status.  EEG to evaluate for seizure.  Level of alertness: Awake  AEDs during EEG study: Keppra  Technical aspects: This EEG study was done with scalp electrodes positioned according to the 10-20 International system of electrode placement. Electrical activity was acquired at a sampling rate of 500Hz  and reviewed with a high frequency filter of 70Hz  and a low frequency filter of 1Hz . EEG data were recorded continuously and digitally stored.   Description: The posterior dominant rhythm consists of 7-8 Hz activity of moderate voltage (25-35 uV) seen predominantly in posterior head regions, symmetric and reactive to eye opening and eye closing.  There was also continued generalized 3 to 6 hz theta-delta slowing, maximal in the right temporal region.  Physiologic photic driving was not seen during photic stimulation.  Hyperventilation was deferred due to acute stroke.  IMPRESSION: This study is suggestive of cortical dysfunction in the right temporal region likely secondary to underlying stroke.  There was also mild to moderate diffuse encephalopathy. No seizures or epileptiform discharges were seen throughout the recording.  Xiomara Sevillano Barbra Sarks

## 2018-12-01 NOTE — Evaluation (Signed)
Physical Therapy Evaluation Patient Details Name: Sheila Young MRN: [id number] DOB: 05-03-1950 Today's Date: 12/01/2018   History of Present Illness  68 y.o. female admitted on 11/30/18 for confusion and L sided weakness.  Neuro consulted for concern of TIA/stroke.  Workup is still pending.  MRI revealed multifocal acute ischemia R hemispheere, single punctate focus of sicehmia in L hemisphere and abnormal R ICA flow.  Pt with significant PMH of stroke, seizure, radiation, HTN, and esophagus CA.  Clinical Impression  Pt is mildly unsteady on her feet, which improved with increased gait distance.  Her coordination and perception is mildly diminished on her left side UE most noticeably (not sure of her baseline given PMH of stroke).  Pt has significant cognitive impairments that make her current situation of living alone not ideal, but it sounds as though family is working on getting her into an higher level of care (ALF per OT's conversation with family).   PT to follow acutely for deficits listed below.      Follow Up Recommendations Home health PT;Other (comment)(I heard via OT that sisters are looking into ALF)    Equipment Recommendations  None recommended by PT    Recommendations for Other Services   NA    Precautions / Restrictions Precautions Precautions: Fall Precaution Comments: mildly unsteady on her feet Restrictions Weight Bearing Restrictions: No      Mobility  Bed Mobility Overal bed mobility: Modified Independent Bed Mobility: Supine to Sit     Supine to sit: Min guard     General bed mobility comments: min guard for safety  Transfers Overall transfer level: Needs assistance   Transfers: Sit to/from Stand Sit to Stand: Min guard         General transfer comment: Min guard assist for safety.  Ambulation/Gait Ambulation/Gait assistance: Min guard;Supervision Gait Distance (Feet): 60 Feet Assistive device: None Gait Pattern/deviations: Step-through  pattern;Staggering right;Staggering left     General Gait Details: Pt with midlly staggering gait pattern, which improved with increaed distance.  Pt turned both directions with supervision by the end of gait.  I would like to get her into the hallway and challenge her balance with a DGI.   Modified Rankin (Stroke Patients Only) Modified Rankin (Stroke Patients Only) Pre-Morbid Rankin Score: Moderate disability Modified Rankin: Moderately severe disability     Balance Overall balance assessment: Needs assistance Sitting-balance support: No upper extremity supported Sitting balance-Leahy Scale: Good     Standing balance support: No upper extremity supported Standing balance-Leahy Scale: Good                               Pertinent Vitals/Pain Pain Assessment: No/denies pain    Home Living Family/patient expects to be discharged to:: Private residence Living Arrangements: Alone Available Help at Discharge: Family;Available PRN/intermittently Type of Home: Independent living facility(retirement apartment, meals only provided) Home Access: Level entry     Home Layout: One level   Additional Comments: Per pt sister [name], pt living in retirement home- fully accessible    Prior Function Level of Independence: Needs assistance   Gait / Transfers Assistance Needed: independent gait, does not drive  ADL's / Homemaking Assistance Needed: Pt has meal support at retirement home. She recently has had medicine compliance issues, as well as not consistently showering/dressing appropriately  Comments: What pt reports and what OT reports sisters are saying are two different things.     Hand Dominance  Extremity/Trunk Assessment   Upper Extremity Assessment Upper Extremity Assessment: Defer to OT evaluation    Lower Extremity Assessment Lower Extremity Assessment: LLE deficits/detail LLE Deficits / Details: left side/leg seems very mildly more uncoordiated  than R side.  Not sure of what if any deficits are remaining at baseline from previous stroke.   LLE Sensation: WNL(per pt report)    Cervical / Trunk Assessment Cervical / Trunk Assessment: Normal  Communication   Communication: No difficulties  Cognition Arousal/Alertness: Awake/alert Behavior During Therapy: WFL for tasks assessed/performed Overall Cognitive Status: Impaired/Different from baseline Area of Impairment: Orientation;Awareness;Problem solving                 Orientation Level: Disoriented to;Place;Time;Situation Current Attention Level: Selective Memory: Decreased recall of precautions;Decreased short-term memory Following Commands: Follows one step commands with increased time Safety/Judgement: Decreased awareness of safety;Decreased awareness of deficits Awareness: Intellectual Problem Solving: Slow processing;Difficulty sequencing;Requires verbal cues;Requires tactile cues General Comments: Pt unable to report specifics of where she is or why.  She had to have multiple choice to get the city name correct, she stated 2021, and had a hard time, but ultimately did come up with [place].               Assessment/Plan    PT Assessment Patient needs continued PT services  PT Problem List Decreased balance;Decreased mobility;Decreased coordination;Decreased cognition;Decreased knowledge of use of DME;Decreased safety awareness;Decreased knowledge of precautions       PT Treatment Interventions Gait training;DME instruction;Functional mobility training;Therapeutic activities;Therapeutic exercise;Stair training;Balance training;Neuromuscular re-education;Cognitive remediation;Patient/family education    PT Goals (Current goals can be found in the Care Plan section)  Acute Rehab PT Goals Patient Stated Goal: to go home PT Goal Formulation: With patient Time For Goal Achievement: 12/15/18 Potential to Achieve Goals: Good    Frequency Min 4X/week            AM-PAC PT "6 Clicks" Mobility  Outcome Measure Help needed turning from your back to your side while in a flat bed without using bedrails?: None Help needed moving from lying on your back to sitting on the side of a flat bed without using bedrails?: None Help needed moving to and from a bed to a chair (including a wheelchair)?: None Help needed standing up from a chair using your arms (e.g., wheelchair or bedside chair)?: None Help needed to walk in hospital room?: A Little Help needed climbing 3-5 steps with a railing? : A Little 6 Click Score: 22    End of Session Equipment Utilized During Treatment: Gait belt Activity Tolerance: Patient tolerated treatment well Patient left: in chair;with call bell/phone within reach;with chair alarm set Nurse Communication: Mobility status PT Visit Diagnosis: Difficulty in walking, not elsewhere classified (R26.2);Other symptoms and signs involving the nervous system [id number])    Time: [id number] PT Time Calculation (min) (ACUTE ONLY): 16 min   Charges:       Wells Guiles B. Dennisha Mouser, PT, DPT  Acute Rehabilitation [phone] pager [phone]) [phone] office  @ [facility]: [phone]   PT Evaluation $PT Eval Moderate Complexity: 1 Mod          12/01/2018, 4:34 PM

## 2018-12-01 NOTE — Progress Notes (Signed)
EEG complete - results pending 

## 2018-12-01 NOTE — Progress Notes (Signed)
Pt. [facility]) called to speak with the nurse. Nurse returned call at 3.39pm. No answer

## 2018-12-01 NOTE — Progress Notes (Addendum)
STROKE TEAM PROGRESS NOTE   INTERVAL HISTORY Pt sitting in bed, working with speech therapist.  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  She does not know what medication she was taking at home.  I called her sister Sheila Young who is taking care of her medications and she told me that patient was not compliant with medication although they set up pillbox for her but she frequently misses medication dose.  Vitals:   11/30/18 2219 12/01/18 0019 12/01/18 0311 12/01/18 0739  BP: 137/61 140/65 137/66 124/83  Pulse: (!) 59 (!) 57 (!) 59 (!) 53  Resp: 18 18 16 20   Temp: 98 F (36.7 C) 98.2 F (36.8 C) 98 F (36.7 C) 98 F (36.7 C)  TempSrc: Oral Oral Oral Oral  SpO2: 98% 98% 99% 100%  Weight: 81 kg     Height: 5\' 6"  (1.676 m)       CBC:  Recent Labs  Lab 11/24/18 1319 11/30/18 1149  WBC 10.4 11.2*  NEUTROABS 7.2 8.3*  HGB 11.3* 11.3*  HCT 36.3 36.3  MCV 93.3 93.3  PLT 243 0000000    Basic Metabolic Panel:  Recent Labs  Lab 11/24/18 1319 11/30/18 1149  NA 144 140  K 4.6 3.6  CL 108 108  CO2 27 22  GLUCOSE 105* 99  BUN 30* 39*  CREATININE 1.85* 1.94*  CALCIUM 9.8 9.7   Lipid Panel:     Component Value Date/Time   CHOL 175 12/01/2018 0217   TRIG 119 12/01/2018 0217   HDL 30 (L) 12/01/2018 0217   CHOLHDL 5.8 12/01/2018 0217   VLDL 24 12/01/2018 0217   LDLCALC 121 (H) 12/01/2018 0217   HgbA1c:  Lab Results  Component Value Date   HGBA1C 6.1 (H) 12/01/2018   Urine Drug Screen:     Component Value Date/Time   LABOPIA NONE DETECTED 11/30/2018 1232   COCAINSCRNUR NONE DETECTED 11/30/2018 1232   LABBENZ NONE DETECTED 11/30/2018 1232   AMPHETMU NONE DETECTED 11/30/2018 1232   THCU NONE DETECTED 11/30/2018 1232   LABBARB NONE DETECTED 11/30/2018 1232    Alcohol Level No results found for: ETH  IMAGING Ct Head Wo Contrast  Result Date: 11/30/2018 CLINICAL DATA:  Onset left-sided weakness yesterday. EXAM: CT HEAD WITHOUT CONTRAST TECHNIQUE:  Contiguous axial images were obtained from the base of the skull through the vertex without intravenous contrast. COMPARISON:  Head CT scan 07/21/2018.  Brain MRI 05/08/2018. FINDINGS: Brain: No evidence of acute infarction, hemorrhage, hydrocephalus, extra-axial collection or mass lesion/mass effect. 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 Scattered lacunar infarctions including involvement of the basal ganglia, thalami and right corona radiata also again seen. Vascular: No hyperdense vessel or unexpected calcification. Skull: Intact.  No focal lesion. Sinuses/Orbits: Negative. Other: None. IMPRESSION: No acute abnormality. Multiple remote infarcts, unchanged. Electronically Signed   By: Inge Rise M.D.   On: 11/30/2018 12:29   Mr Brain Wo Contrast  Result Date: 12/01/2018 CLINICAL DATA:  Focal neurologic deficit. History of esophageal cancer. EXAM: MRI HEAD WITHOUT CONTRAST TECHNIQUE: Multiplanar, multiecho pulse sequences of the brain and surrounding structures were obtained without intravenous contrast. COMPARISON:  Head CT 11/30/2018 and brain MRI 05/08/2018 FINDINGS: BRAIN: There are multiple small foci of abnormal diffusion restriction within the right hemisphere, within the MCA and PCA territories. There is a single punctate focus of diffusion restriction in the left hemisphere near the hand motor area. There is an old infarct of the left PCA territory with associated encephalomalacia.  Multiple old small vessel infarcts on the right. Multifocal white matter hyperintensity, most commonly due to chronic ischemic microangiopathy. The cerebral and cerebellar volume are age-appropriate. There is no hydrocephalus. The midline structures are normal. VASCULAR: Susceptibility-sensitive sequences show no chronic microhemorrhage or superficial siderosis. Abnormal right ICA flow void at the skull base. SKULL AND UPPER CERVICAL SPINE: Calvarial bone marrow signal is normal. There is no skull base mass.  The visualized upper cervical spine and soft tissues are normal. SINUSES/ORBITS: There are no fluid levels or advanced mucosal thickening. The mastoid air cells and middle ear cavities are free of fluid. The orbits are normal. IMPRESSION: 1. Multifocal acute ischemia, predominantly within the right hemisphere, within multiple vascular territories, possibly embolic. 2. Single punctate focus of ischemia within the left hemisphere along the left precentral gyrus. 3. Abnormal right ICA flow void at the skull base which may indicate slow flow or occlusion. CTA or MRA may be helpful for further characterization. 4. Multiple old infarcts and findings of chronic small vessel ischemia. Electronically Signed   By: Ulyses Jarred M.D.   On: 12/01/2018 01:48   Dg Chest Portable 1 View  Result Date: 11/30/2018 CLINICAL DATA:  68 year old with acute onset of LEFT body weakness yesterday. Today the patient was dragging her LEFT leg while walking. Personal history of stroke and seizures. EXAM: PORTABLE CHEST 1 VIEW COMPARISON:  07/21/2018 and earlier. FINDINGS: Cardiac silhouette mildly enlarged for AP portable technique, unchanged. Lungs clear. Bronchovascular markings normal. Pulmonary vascularity normal. No visible pleural effusions. No pneumothorax. IMPRESSION: Stable mild cardiomegaly. No acute cardiopulmonary disease. Electronically Signed   By: Evangeline Dakin M.D.   On: 11/30/2018 12:48    PHYSICAL EXAM  Temp:  [97.4 F (36.3 C)-98.2 F (36.8 C)] 97.4 F (36.3 C) (08/25 1531) Pulse Rate:  [49-64] 61 (08/25 1531) Resp:  [13-20] 17 (08/25 1531) BP: (124-148)/(60-83) 129/68 (08/25 1531) SpO2:  [95 %-100 %] 100 % (08/25 1531) Weight:  [81 kg] 81 kg (08/24 2219)  General - Well nourished, well developed, in no apparent distress.  Ophthalmologic - fundi not visualized due to noncooperation.  Cardiovascular - Regular rate and rhythm, not in A. fib or a flutter.  Mental Status -  Level of arousal and  orientation to months, place, and person were intact, however not orientated to age and year. Language including expression, naming, repetition, comprehension was assessed and found intact.  Cranial Nerves II - XII - II - right hemianopia, likely chronic III, IV, VI - Extraocular movements intact. V - Facial sensation intact bilaterally. VII - Facial movement intact bilaterally. VIII - Hearing & vestibular intact bilaterally. X - Palate elevates symmetrically. XI - Chin turning & shoulder shrug intact bilaterally. XII - Tongue protrusion intact.  Motor Strength - The patient's strength was normal in all extremities and pronator drift was absent.  Bulk was normal and fasciculations were absent.   Motor Tone - Muscle tone was assessed at the neck and appendages and was normal.  Reflexes - The patient's reflexes were symmetrical in all extremities and she had no pathological reflexes.  Sensory - Light touch, temperature/pinprick were assessed and were symmetrical.    Coordination - The patient had normal movements in the hands with no ataxia or dysmetria.  Tremor was absent.  Gait and Station - deferred.   ASSESSMENT/PLAN Ms. Sheila Young is a 68 y.o. female with history of esophageal cancer s/p Taxol/carboplatin and radiation with continuing dysphagia, hypertension, hypercholesterolemia, atrial flutter on Eliquis, stroke with unclear residual  deficits, seizures on Keppra, strong suspicion for noncompliance with medications b/c she lives by herself and has had memory issues over the past few months to years presenting with L sided weakness and altered mental status.   Stroke:   Mult small to punctate R MCA, MCA/PCA, MCA/ACA and single L MCA punctate infarcts embolic secondary to right ICA occlusion and known AF not compliance with Eliquis   CT head No acute abnormality. Mult old infarcts, stable.  MRI  Mult R brain infarcts. Single L precentral gyrus infarct. Abnormal R ICA flow at  skull base. muld old infarcts and small vessel disease.  Chronic left posterior MCA, left thalamic, right caudate head, bilateral CR/BG infarcts  MRA head and neck right ICA occlusion from bulb to terminal. Poor visualization of posterior circulation including PCAs and BA, hypoplastic vs. stenosis  2D Echo EF 55-60%  LDL 121  HgbA1c 6.1  HIV pending   SCDs for VTE prophylaxis  Eliquis (apixaban) daily prior to admission, now on aspirin 325 mg daily. Recommend resume eliquis 5mg  bid as well as add ASA 81mg  for stroke prevention.   Therapy recommendations:  pending   Disposition:  pending   Atrial Fibrillation  Home anticoagulation:  Eliquis (apixaban) daily - not compliant as per sister Sheila Young  Last INR 1.3 . Rate controlled . Resume eliquis 5mg  bid . Continue Eliquis (apixaban) at discharge . Emphasize on medication compliance   Right ICA occlusion  New since 2017  11/2015 OSH CUS showed bilateral 1-39% stenosis  11/2015 OSH CTA head showed patent right ICA with atherosclerosis  This admission right ICA occlusion on MRA head and neck  Partially the reason for right brain infarcts  Not compliant with eliquis  Resume eliquis 5mg  bid and add ASA 81mg    Seizure d/o  On keppra PTA, ? Compliance  keppra level on admission pending  Continued keepra 500mg  bid   EEG pending   Vascular dementia  CT and MRI showed multiple old strokes in the past as well as acute stroke mainly on the right brain this admission  Disorientation, cognitive impairment  As per Sheila Young, patient has been difficulty with memory, cognition and intermittently agitated for some time  Consistent with vascular dementia  Follow-up with neurology as outpatient, consider Aricept and/or Namenda  Hypertension  Stable . Permissive hypertension (OK if < 180/105) but gradually normalize in 5-7 days . Long-term BP goal 130-150 due to right ICA occlusion  Hyperlipidemia  Home meds:   lipitor 40  LDL 121, goal < 70  Now on lipitor 80  Continue statin at discharge  Tobacco abuse  Current smoker  Smoking cessation counseling provided  Pt is willing to quit  Other Stroke Risk Factors  Advanced age  Medication noncompliance  Hx stroke/TIA  Other Active Problems  Hyperaldosteronism on spironolactone  CKD stage III - Cre 1.94  Hx esophageal cancer s/p chemo and XRT with resultant dysphagia  Hospital day # 0  I spent  35 minutes in total face-to-face time with the patient, more than 50% of which was spent in counseling and coordination of care, reviewing test results, images and medication, and discussing the diagnosis of stroke, PAF, right ICA occlusion, HTN, endovascular dementia treatment plan and potential prognosis. This patient's care requiresreview of multiple databases, neurological assessment, discussion with family, other specialists and medical decision making of high complexity. I had long discussion with pt at bedside and sister Sheila Young over the phone, updated pt current condition, treatment plan and potential prognosis.  They expressed understanding and appreciation.   Rosalin Hawking, MD PhD Stroke Neurology 12/01/2018 4:26 PM  To contact Stroke Continuity provider, please refer to [email or web address] After hours, contact General Neurology

## 2018-12-01 NOTE — Progress Notes (Signed)
  Echocardiogram 2D Echocardiogram has been performed.  Sheila Young 12/01/2018, 9:04 AM

## 2018-12-02 DIAGNOSIS — F0150 Vascular dementia without behavioral disturbance: Secondary | ICD-10-CM

## 2018-12-02 LAB — CBC WITH DIFFERENTIAL/PLATELET
Abs Immature Granulocytes: 0.08 10*3/uL — ABNORMAL HIGH (ref 0.00–0.07)
Basophils Absolute: 0.1 10*3/uL (ref 0.0–0.1)
Basophils Relative: 1 %
Eosinophils Absolute: 0.3 10*3/uL (ref 0.0–0.5)
Eosinophils Relative: 2 %
HCT: 39.5 % (ref 36.0–46.0)
Hemoglobin: 12.5 g/dL (ref 12.0–15.0)
Immature Granulocytes: 1 %
Lymphocytes Relative: 16 %
Lymphs Abs: 2.2 10*3/uL (ref 0.7–4.0)
MCH: 29.1 pg (ref 26.0–34.0)
MCHC: 31.6 g/dL (ref 30.0–36.0)
MCV: 91.9 fL (ref 80.0–100.0)
Monocytes Absolute: 1 10*3/uL (ref 0.1–1.0)
Monocytes Relative: 8 %
Neutro Abs: 10 10*3/uL — ABNORMAL HIGH (ref 1.7–7.7)
Neutrophils Relative %: 72 %
Platelets: 268 10*3/uL (ref 150–400)
RBC: 4.3 MIL/uL (ref 3.87–5.11)
RDW: 14.5 % (ref 11.5–15.5)
WBC: 13.7 10*3/uL — ABNORMAL HIGH (ref 4.0–10.5)
nRBC: 0 % (ref 0.0–0.2)

## 2018-12-02 LAB — BASIC METABOLIC PANEL
Anion gap: 12 (ref 5–15)
BUN: 26 mg/dL — ABNORMAL HIGH (ref 8–23)
CO2: 22 mmol/L (ref 22–32)
Calcium: 9.7 mg/dL (ref 8.9–10.3)
Chloride: 107 mmol/L (ref 98–111)
Creatinine, Ser: 1.4 mg/dL — ABNORMAL HIGH (ref 0.44–1.00)
GFR calc Af Amer: 45 mL/min — ABNORMAL LOW (ref >60)
GFR calc non Af Amer: 39 mL/min — ABNORMAL LOW (ref >60)
Glucose, Bld: 102 mg/dL — ABNORMAL HIGH (ref 70–99)
Potassium: 3.7 mmol/L (ref 3.5–5.1)
Sodium: 141 mmol/L (ref 135–145)

## 2018-12-02 MED ORDER — TUBERCULIN PPD 5 UNIT/0.1ML ID SOLN
5.0000 [IU] | Freq: Once | INTRADERMAL | Status: AC
  Administered 2018-12-02: 5 [IU] via INTRADERMAL
  Filled 2018-12-02: qty 0.1

## 2018-12-02 NOTE — Progress Notes (Signed)
PROGRESS NOTE    Sheila Young  [id number] DOB: [birth year] DOA: 11/30/2018 PCP: Beckie Salts, MD    Brief Narrative:  68 year old female with history of stroke, cognitive dysfunction, esophageal carcinoma with dysphagia, hyperaldosteronism and chronic anemia, chronic kidney disease stage III presented to the hospital on sister's request who noticed that she has been more slow to respond for last few days.  Patient does have history of seizure.  She does have cognitive dysfunction and might have problem of taking medications.  one of the sister thinks she might have missed medications for last 4 days PTA.  Patient is recently diagnosed with A. fib and she is on Eliquis for that.   Assessment & Plan:   Active Problems:   Esophagus cancer (HCC)   Mechanical dysphagia   Protein-calorie malnutrition, severe (HCC)   Seizure (HCC)   Atrial flutter (HCC)   Hyperaldosteronism (HCC)   TIA (transient ischemic attack)   Acute metabolic encephalopathy   CVA (cerebral vascular accident) ([facility])  Acute ischemic stroke, multiple territory possibly embolic: With acute metabolic encephalopathy in the setting of underlying chronic cognitive dysfunction.  Clinical findings, more confusion than usual. CT head findings, normal. MRI of the brain, multiple areas of ischemia likely embolic source.  MRA of the head and neck shows complete right ICA blockage.   2D echocardiogram, normal.  No PFO. Antiplatelet therapy, on Eliquis at home, noncompliant.  Recommended Eliquis along with aspirin 81 mg daily. LDL 121, on atorvastatin 40 mg at home, increased to 80 mg daily.   Hemoglobin A1c, 6.1 DVT prophylaxis, Eliquis Therapy recommendations, home health PT. Followed by neurology, speech, PT OT.  Seizure disorder: On Keppra at home.  No evidence of new seizure.  Keppra continued.  Levels pending.  Paroxysmal A. Fib: Currently rate controlled sinus rhythm.  On metoprolol.  Therapeutic on  Eliquis.  Vascular dementia: Lives at senior living apartment.  Now she will be going to assisted living place.  Acute kidney injury on chronic kidney disease stage III: Baseline creatinine about 1.2.  Improving levels.  Discontinue IV fluids.  Encourage oral liquids.   Discussed and updated patient's sister Ms. [name], as patient has multiple sisters.  According to the sisters, they have found an assisted living facility for the patient to go from the hospital.  She has been at senior living apartment but they doubt compliance but the medications and supervision. Patient will need PPD, admission paperwork to assisted living.  Patient is medically stable.  DVT prophylaxis: Eliquis Code Status: Full code Family Communication: Sister [name] Disposition Plan: Assisted living facility with PT OT when arrangements are made.   Consultants:   Neurology  Procedures:   None  Antimicrobials:   None   Subjective: Patient seen and examined. She denies any complaints.  No other overnight events.  Objective: Vitals:   12/01/18 1920 12/01/18 2345 12/02/18 0300 12/02/18 0806  BP: 133/70 130/67 127/71 (!) 152/64  Pulse: 60 66 67 (!) 59  Resp: 18 18 18 16   Temp: 98.1 F (36.7 C) 98 F (36.7 C) 97.8 F (36.6 C) 97.7 F (36.5 C)  TempSrc: Oral Oral Oral Oral  SpO2: 100% 100% 100% 100%  Weight:      Height:        Intake/Output Summary (Last 24 hours) at 12/02/2018 1037 Last data filed at 12/02/2018 0600 Gross per 24 hour  Intake 1478.7 ml  Output 400 ml  Net 1078.7 ml   Filed Weights   11/30/18 2219  Weight: 81  kg    Examination:  General exam: Appears calm and comfortable  Respiratory system: Clear to auscultation. Respiratory effort normal. Cardiovascular system: S1 & S2 heard, RRR. No JVD, murmurs, rubs, gallops or clicks. No pedal edema. Gastrointestinal system: Abdomen is nondistended, soft and nontender. No organomegaly or masses felt. Normal bowel sounds  heard. Central nervous system: Alert and oriented x2.  Possibly she has some hemianopia on the right side, unreliable historian.  No other focal neurological deficits.  Severe cognitive dysfunction. Extremities: Symmetric 5 x 5 power. Skin: No rashes, lesions or ulcers Psychiatry: Judgement and insight appear normal. Mood & affect appropriate.     Data Reviewed: I have personally reviewed following labs and imaging studies  CBC: Recent Labs  Lab 11/30/18 1149 12/02/18 0327  WBC 11.2* 13.7*  NEUTROABS 8.3* 10.0*  HGB 11.3* 12.5  HCT 36.3 39.5  MCV 93.3 91.9  PLT 243 [id number]   Basic Metabolic Panel: Recent Labs  Lab 11/30/18 1149 12/02/18 0327  NA 140 141  K 3.6 3.7  CL 108 107  CO2 22 22  GLUCOSE 99 102*  BUN 39* 26*  CREATININE 1.94* 1.40*  CALCIUM 9.7 9.7   GFR: Estimated Creatinine Clearance: 41.9 mL/min (A) (by C-G formula based on SCr of 1.4 mg/dL (H)). Liver Function Tests: Recent Labs  Lab 11/30/18 1149  AST 15  ALT 14  ALKPHOS 62  BILITOT 0.6  PROT 6.9  ALBUMIN 3.6   No results for input(s): LIPASE, AMYLASE in the last 168 hours. Recent Labs  Lab 11/30/18 1232  AMMONIA 18   Coagulation Profile: Recent Labs  Lab 11/30/18 1149  INR 1.3*   Cardiac Enzymes: No results for input(s): CKTOTAL, CKMB, CKMBINDEX, TROPONINI in the last 168 hours. BNP (last 3 results) No results for input(s): PROBNP in the last 8760 hours. HbA1C: Recent Labs    12/01/18 0217  HGBA1C 6.1*   CBG: Recent Labs  Lab 11/30/18 1214  GLUCAP 82   Lipid Profile: Recent Labs    12/01/18 0217  CHOL 175  HDL 30*  LDLCALC 121*  TRIG 119  CHOLHDL 5.8   Thyroid Function Tests: No results for input(s): TSH, T4TOTAL, FREET4, T3FREE, THYROIDAB in the last 72 hours. Anemia Panel: Recent Labs    12/01/18 0327  VITAMINB12 590  FOLATE 14.0  FERRITIN 115  TIBC 276  IRON 52  RETICCTPCT 1.7   Sepsis Labs: No results for input(s): PROCALCITON, LATICACIDVEN in the last  168 hours.  Recent Results (from the past 240 hour(s))  SARS CORONAVIRUS 2 (TAT 6-12 HRS) Nasal Swab Aptima Multi Swab     Status: None   Collection Time: 11/30/18  2:01 PM   Specimen: Aptima Multi Swab; Nasal Swab  Result Value Ref Range Status   SARS Coronavirus 2 NEGATIVE NEGATIVE Final    Comment: (NOTE) SARS-CoV-2 target nucleic acids are NOT DETECTED. The SARS-CoV-2 RNA is generally detectable in upper and lower respiratory specimens during the acute phase of infection. Negative results do not preclude SARS-CoV-2 infection, do not rule out co-infections with other pathogens, and should not be used as the sole basis for treatment or other patient management decisions. Negative results must be combined with clinical observations, patient history, and epidemiological information. The expected result is Negative. Fact Sheet for Patients: SugarRoll.be Fact Sheet for Healthcare Providers: https://www.woods-mathews.com/ This test is not yet approved or cleared by the [place] FDA and  has been authorized for detection and/or diagnosis of SARS-CoV-2 by FDA under an Emergency Use  Authorization (EUA). This EUA will remain  in effect (meaning this test can be used) for the duration of the COVID-19 declaration under Section 56 4(b)(1) of the Act, 21 U.S.C. section 360bbb-3(b)(1), unless the authorization is terminated or revoked sooner. Performed at [facility], [place] [address], [place], [place] [address]          Radiology Studies: Ct Head Wo Contrast  Result Date: 11/30/2018 CLINICAL DATA:  Onset left-sided weakness yesterday. EXAM: CT HEAD WITHOUT CONTRAST TECHNIQUE: Contiguous axial images were obtained from the base of the skull through the vertex without intravenous contrast. COMPARISON:  Head CT scan 07/21/2018.  Brain MRI 05/08/2018. FINDINGS: Brain: No evidence of acute infarction, hemorrhage, hydrocephalus, extra-axial  collection or mass lesion/mass effect. 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 Scattered lacunar infarctions including involvement of the basal ganglia, thalami and right corona radiata also again seen. Vascular: No hyperdense vessel or unexpected calcification. Skull: Intact.  No focal lesion. Sinuses/Orbits: Negative. Other: None. IMPRESSION: No acute abnormality. Multiple remote infarcts, unchanged. Electronically Signed   By: Inge Rise M.D.   On: 11/30/2018 12:29   Mr Angio Head Wo Contrast  Result Date: 12/01/2018 CLINICAL DATA:  Confusion.  Stroke, follow-up. EXAM: MRA NECK WITHOUT CONTRAST MRA HEAD WITHOUT CONTRAST TECHNIQUE: Multiplanar and multiecho pulse sequences of the neck were obtained without intravenous contrast. Angiographic images of the neck were obtained using MRA technique without intravenous contast.; Angiographic images of the Circle of Willis were obtained using MRA technique without intravenous contrast. COMPARISON:  None. FINDINGS: MRA NECK FINDINGS Time-of-flight images demonstrate at fluid disruption of the proximal right internal carotid artery at the bifurcation. There is no distal reconstituted flow visible by time-of-flight imaging. The left common carotid artery and bifurcation is normal. Vertebral arteries originate from the subclavian arteries. There is antegrade flow in both vertebral arteries. No significant flow disturbance is present. MRA HEAD FINDINGS Time-of-flight images demonstrate no significant flow within the right internal carotid artery from the skull base through the ICA terminus. There is some irregularity in the left cavernous internal carotid artery without a significant stenosis relative to the more distal vessel. A1 and M1 segments demonstrate flow bilaterally. ACA vessels are normal bilaterally. There is some irregularity of MCA branch vessels. This may be artifactual. No significant proximal stenosis or occlusion is present. The vertebral  arteries are hypoplastic bilaterally. The left vertebral artery terminates at the PICA. There is decreased flow signal within the basilar artery. PCA branch vessels are not well seen. Prominent left posterior communicating artery is present. 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 IMPRESSION: 1. No flow visible in the right internal carotid artery from the bifurcation to the ICA terminus. 2. Normal flow in the left carotid artery through the ICA terminus. 3. Poor visualization of the posterior circulation on the MRA circle-of-Willis. The vessels appear hypoplastic. 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 4. 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 Electronically Signed   By: San Morelle M.D.   On: 12/01/2018 12:21   Mr Angio Neck Wo Contrast  Result Date: 12/01/2018 CLINICAL DATA:  Confusion.  Stroke, follow-up. EXAM: MRA NECK WITHOUT CONTRAST MRA HEAD WITHOUT CONTRAST TECHNIQUE: Multiplanar and multiecho pulse sequences of the neck were obtained without intravenous contrast. Angiographic images of the neck were obtained using MRA technique without intravenous contast.; Angiographic images of the Circle of Willis were obtained using MRA technique without intravenous contrast. COMPARISON:  None. FINDINGS: MRA NECK FINDINGS Time-of-flight images demonstrate at fluid disruption of the  proximal right internal carotid artery at the bifurcation. There is no distal reconstituted flow visible by time-of-flight imaging. The left common carotid artery and bifurcation is normal. Vertebral arteries originate from the subclavian arteries. There is antegrade flow in both vertebral arteries. No significant flow disturbance is present. MRA HEAD FINDINGS Time-of-flight images demonstrate no significant flow within the right internal carotid artery from the skull base through the ICA terminus. There is some irregularity in the left cavernous internal carotid artery without a significant stenosis relative to  the more distal vessel. A1 and M1 segments demonstrate flow bilaterally. ACA vessels are normal bilaterally. There is some irregularity of MCA branch vessels. This may be artifactual. No significant proximal stenosis or occlusion is present. The vertebral arteries are hypoplastic bilaterally. The left vertebral artery terminates at the PICA. There is decreased flow signal within the basilar artery. PCA branch vessels are not well seen. Prominent left posterior communicating artery is present. 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 IMPRESSION: 1. No flow visible in the right internal carotid artery from the bifurcation to the ICA terminus. 2. Normal flow in the left carotid artery through the ICA terminus. 3. Poor visualization of the posterior circulation on the MRA circle-of-Willis. The vessels appear hypoplastic. 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 4. 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 Electronically Signed   By: San Morelle M.D.   On: 12/01/2018 12:21   Mr Brain Wo Contrast  Result Date: 12/01/2018 CLINICAL DATA:  Focal neurologic deficit. History of esophageal cancer. EXAM: MRI HEAD WITHOUT CONTRAST TECHNIQUE: Multiplanar, multiecho pulse sequences of the brain and surrounding structures were obtained without intravenous contrast. COMPARISON:  Head CT 11/30/2018 and brain MRI 05/08/2018 FINDINGS: BRAIN: There are multiple small foci of abnormal diffusion restriction within the right hemisphere, within the MCA and PCA territories. There is a single punctate focus of diffusion restriction in the left hemisphere near the hand motor area. There is an old infarct of the left PCA territory with associated encephalomalacia. Multiple old small vessel infarcts on the right. Multifocal white matter hyperintensity, most commonly due to chronic ischemic microangiopathy. The cerebral and cerebellar volume are age-appropriate. There is no hydrocephalus. The midline structures are normal.  VASCULAR: Susceptibility-sensitive sequences show no chronic microhemorrhage or superficial siderosis. Abnormal right ICA flow void at the skull base. SKULL AND UPPER CERVICAL SPINE: Calvarial bone marrow signal is normal. There is no skull base mass. The visualized upper cervical spine and soft tissues are normal. SINUSES/ORBITS: There are no fluid levels or advanced mucosal thickening. The mastoid air cells and middle ear cavities are free of fluid. The orbits are normal. IMPRESSION: 1. Multifocal acute ischemia, predominantly within the right hemisphere, within multiple vascular territories, possibly embolic. 2. Single punctate focus of ischemia within the left hemisphere along the left precentral gyrus. 3. Abnormal right ICA flow void at the skull base which may indicate slow flow or occlusion. CTA or MRA may be helpful for further characterization. 4. Multiple old infarcts and findings of chronic small vessel ischemia. Electronically Signed   By: Ulyses Jarred M.D.   On: 12/01/2018 01:48   Dg Chest Portable 1 View  Result Date: 11/30/2018 CLINICAL DATA:  68 year old with acute onset of LEFT body weakness yesterday. Today the patient was dragging her LEFT leg while walking. Personal history of stroke and seizures. EXAM: PORTABLE CHEST 1 VIEW COMPARISON:  07/21/2018 and earlier. FINDINGS: Cardiac silhouette mildly enlarged for AP portable technique, unchanged. Lungs clear. Bronchovascular markings normal. Pulmonary vascularity normal. No  visible pleural effusions. No pneumothorax. IMPRESSION: Stable mild cardiomegaly. No acute cardiopulmonary disease. Electronically Signed   By: Evangeline Dakin M.D.   On: 11/30/2018 12:48        Scheduled Meds:  apixaban  5 mg Oral BID   aspirin EC  81 mg Oral Daily   atorvastatin  80 mg Oral q1800   feeding supplement (ENSURE ENLIVE)  237 mL Oral BID BM   FLUoxetine  10 mg Oral Daily   levETIRAcetam  500 mg Oral BID   multivitamin with minerals  1  tablet Oral Daily   pantoprazole  40 mg Oral Daily   spironolactone  50 mg Oral Daily   tuberculin  5 Units Intradermal Once   Continuous Infusions:    LOS: 1 day    Time spent: 25 minutes     Barb Merino, MD [facility] Pager [phone]  If 7PM-7AM, please contact night-coverage [email or web address] Password [facility] 12/02/2018, 10:37 AM

## 2018-12-02 NOTE — Progress Notes (Signed)
  Speech Language Pathology Treatment: Dysphagia  Patient Details Name: Sheila Young MRN: [id number] DOB: [birth year] Today's Date: 12/02/2018 Time: 1025-1050 SLP Time Calculation (min) (ACUTE ONLY): 25 min  Assessment / Plan / Recommendation Clinical Impression  SLP visit to assure po tolerance and to educate pt to aspiration/esophageal precautions given her h/o esophageal cancer s/p XRT and narrowing to 8 mm on prior esophagram 2019.  Pt denies any dysphagia - stating foods/drinks clear well.    Observed her with intake of cold orange juice  -  Which she immediately placed her hand to her distal pharyngeal region and winced stating it "burned" and it also was slow to clear.   She tolerates warmer liquids better than cold thus suspect this is due to her esophageal XRT.  Advised her to monitor her swallowing closely and advice GI or her cancer MD if progresses to having difficulties due to possibility of radiation induced strictures.   Further informed her of need to consume plenty of liquids with all medications due to some being caustic to esophagus if lodge - and possibly crushing if needed.  Using teach back, pt informed.  No dysphagia follow up needed as pt at baseline and tolerating.    HPI HPI: Sheila Young is a 68 y.o. female with medical history of CVA; seizures; esophageal CA dysphagia, hyperaldosteronism chronic anemia. She was admitted with altered mental status and L side weakness. MRI Multifocal acute ischemia, predominantly within the right hemisphere, within multiple vascular territories, possibly embolic Single punctate focus of ischemia within the left hemisphere along the precentral gyrus, Multiple old infarcts and findings of chronic small vessel ischemia. Esophagram performed 06/25/17 showing slightly irregular stricture of the distal esophagus measuring up to 8 mm in maximal diameter, spanning a length of 3.6 cm, likely secondary to prior radiation. A 13 mm barium tablet would not  pass through the stricture. 10/15/2017 MBS showed oral and pharyngeal swallow fx WNL. CXR 11/30/18 Stable mild cardiomegaly. No acute cardiopulmonary disease.       SLP Plan  (no dysphagia follow up needed, pt with baseline esophageal cancer s/p XRT)  Patient needs continued Speech Lanaguage Pathology Services    Recommendations  Diet recommendations: Regular Medication Administration: Whole meds with liquid Supervision: Patient able to self feed Compensations: Minimize environmental distractions;Slow rate;Small sips/bites Postural Changes and/or Swallow Maneuvers: Seated upright 90 degrees;Upright 30-60 min after meal                Oral Care Recommendations: Oral care BID Follow up Recommendations: Inpatient Rehab SLP Visit Diagnosis: Attention and concentration deficit;Cognitive communication deficit (R41.841) Attention and concentration deficit following: Cerebral infarction Plan: (no dysphagia follow up needed, pt with baseline esophageal cancer s/p XRT)       GO               Luanna Salk, MS [facility] SLP Acute Rehab Services Pager [phone] Office [phone]  Macario Golds 12/02/2018, 11:23 AM

## 2018-12-02 NOTE — Evaluation (Signed)
Speech Language Pathology Evaluation Patient Details Name: Sheila Young MRN: [id number] DOB: 12-13-1950 Today's Date: 12/02/2018 Time: 0731-0803 SLP Time Calculation (min) (ACUTE ONLY): 32 min  Problem List:  Patient Active Problem List   Diagnosis Date Noted  . Acute metabolic encephalopathy [id number]  . CVA (cerebral vascular accident) ([place]) 12/01/2018  . TIA (transient ischemic attack) 11/30/2018  . Thrombocytopenia ([place]) 10/19/2017  . Atrial flutter ([place]) 10/17/2017  . Hyperaldosteronism ([place]) 10/17/2017  . Seizure ([place]) 10/15/2017  . Aspiration pneumonia of both lower lobes due to gastric secretions ([place]) 10/15/2017  . Acute hypoxemic respiratory failure ([place]) 10/15/2017  . Rectal bleeding   . Acute blood loss anemia   . Platelet inhibition due to Plavix   . Diverticulosis of colon with hemorrhage   . GI bleed 09/03/2017  . Dysphagia   . Protein-calorie malnutrition, severe ([place]) 04/09/2014  . Esophagus cancer ([place]) 04/07/2014  . Mechanical dysphagia 04/07/2014  . Esophageal cancer ([place]) 04/07/2014   Past Medical History:  Past Medical History:  Diagnosis Date  . Anger   . Esophagus cancer ([place]) 04/07/2014  . High aldosterone ([place])   . History of noncompliance with medical treatment   . Hyperaldosteronism ([place]) 10/17/2017  . Hypercholesteremia   . Hypertension   . Hypokalemia   . Mammogram abnormal   . Mechanical dysphagia 04/07/2014  . Radiation    54 Gy to distal esophagus  . Seizures ([place])   . Stroke [facility])    Past Surgical History:  Past Surgical History:  Procedure Laterality Date  . IR REMOVAL TUN ACCESS W/ PORT W/O FL MOD SED  02/18/2017   HPI:  Sheila Young is a 68 y.o. female with medical history of CVA; seizures; esophageal CA dysphagia, hyperaldosteronism chronic anemia. She was admitted with altered mental status and L side weakness. MRI Multifocal acute ischemia, predominantly within the right hemisphere, within multiple vascular territories,  possibly embolic Single punctate focus of ischemia within the left hemisphere along the precentral gyrus, Multiple old infarcts and findings of chronic small vessel ischemia. Esophagram performed 06/25/17 showing slightly irregular stricture of the distal esophagus measuring up to 8 mm in maximal diameter, spanning a length of 3.6 cm, likely secondary to prior radiation. A 13 mm barium tablet would not pass through the stricture. 10/15/2017 MBS showed oral and pharyngeal swallow fx WNL. CXR 11/30/18 Stable mild cardiomegaly. No acute cardiopulmonary disease.    Assessment / Plan / Recommendation Clinical Impression  MOCA 7.2 administered to pt (without written/viewing portion due to pt's lack of glasses and stating items are "fuzzy").  She scored 5/25 points indicative of severe cognitive deficits.  Sustained attention and processing of information is slow which is impacting pt's cognition significantly.  She is able to articulate her needs and language for this is functional.  Dysarthria noted with imprecise articulation and a single incident of "stuttering" observed.  As pt was Independent prior to admit and lives alone, she will benefit from aggressive rehab to [facility] her recovery and decrease caregiver burden.  Pt giggled during session - appears to be compensation for cognitive deficits.  Perseveration x1 noted during session.    SLP Assessment  SLP Recommendation/Assessment: Patient needs continued Speech Lanaguage Pathology Services SLP Visit Diagnosis: Attention and concentration deficit;Cognitive communication deficit (R41.841) Attention and concentration deficit following: Cerebral infarction    Follow Up Recommendations  Inpatient Rehab    Frequency and Duration min 2x/week  2 weeks      SLP Evaluation Cognition  Orientation Level: Oriented to person;Oriented  to place;Disoriented to situation;Disoriented to time Attention: Sustained Sustained Attention: Impaired Sustained Attention  Impairment: Verbal complex Memory: Impaired Memory Impairment: Storage deficit;Retrieval deficit(recalled 0/5 words I, 3/5 with multiple choice- she verbalized she was guessing the words) Awareness: Impaired Awareness Impairment: Intellectual impairment(pt stating she can get up on her own, decreased awareness to deficits) Problem Solving: Impaired Problem Solving Impairment: Verbal complex(simple subtraction difficult) Safety/Judgment: Impaired       Comprehension  Auditory Comprehension Yes/No Questions: Not tested Commands: Within Functional Limits(for tasks tested during [facility]) Interfering Components: Attention;Processing speed;Working memory;Visual impairments EffectiveTechniques: Media planner: Not tested Reading Comprehension Reading Status: Not tested    Expression Expression Primary Mode of Expression: Verbal Verbal Expression Overall Verbal Expression: Impaired Initiation: No impairment Level of Generative/Spontaneous Verbalization: Sentence Repetition: Impaired Level of Impairment: Sentence level(lengthier sentence level) Naming: Impairment Confrontation: Impaired(1/3 items named correctly, 2/3 with phonemic cue, perseveration x1) Pragmatics: No impairment Interfering Components: Attention Effective Techniques: Phonemic cues Non-Verbal Means of Communication: Not applicable Written Expression Dominant Hand: Right Written Expression: Not tested(pt does not have glasses to use for distance and reading)   Oral / Motor      GO                   Luanna Salk, MS [facility] SLP Acute Rehab Services Pager [phone] Office [phone]  Macario Golds 12/02/2018, 9:37 AM

## 2018-12-02 NOTE — Progress Notes (Signed)
Physical Therapy Treatment Patient Details Name: Sheila Young MRN: [id number] DOB: [birth year] Today's Date: 12/02/2018    History of Present Illness 68 y.o. female admitted on 11/30/18 for confusion and L sided weakness.  Neuro consulted for concern of TIA/stroke.  Workup is still pending.  MRI revealed multifocal acute ischemia R hemispheere, single punctate focus of sicehmia in L hemisphere and abnormal R ICA flow.  Pt with significant PMH of stroke, seizure, radiation, HTN, and esophagus CA.     PT Comments    Pt was able to walk a good distance down the hallway today and was able to report that she was unsteady (showing some emerging awareness).  She continues to have trouble with orientation questions, processing and safety awareness.  Sister was present for our session today.  PT to follow acutely until d/c confirmed.    Follow Up Recommendations  Home health PT;Supervision/Assistance - 24 hour     Equipment Recommendations  None recommended by PT    Recommendations for Other Services   NA     Precautions / Restrictions Precautions Precautions: Fall Precaution Comments: mildly unsteady on her feet    Mobility  Bed Mobility Overal bed mobility: Needs Assistance Bed Mobility: Supine to Sit     Supine to sit: Min guard     General bed mobility comments: Min gaurd assist for safety.  Bed is saturated with urine  Transfers Overall transfer level: Needs assistance Equipment used: Rolling walker (2 wheeled) Transfers: Sit to/from Stand Sit to Stand: Min guard         General transfer comment: Min guard assist for safety.  Ambulation/Gait Ambulation/Gait assistance: Min guard Gait Distance (Feet): 280 Feet Assistive device: 1 person hand held assist Gait Pattern/deviations: Step-through pattern;Staggering right;Staggering left Gait velocity: decreased Gait velocity interpretation: 1.31 - 2.62 ft/sec, indicative of limited community ambulator General Gait  Details: Pt wiht midlly staggering gait pattern, she walks awfully close to left sided obstacles in the hallway (never actually ran into any), PT positioned on her right for balance.     Modified Rankin (Stroke Patients Only) Modified Rankin (Stroke Patients Only) Pre-Morbid Rankin Score: Moderate disability Modified Rankin: Moderately severe disability     Balance Overall balance assessment: Needs assistance Sitting-balance support: Feet supported;No upper extremity supported Sitting balance-Leahy Scale: Good Sitting balance - Comments: Pt able to donn/doff socks EOB with supervision.    Standing balance support: No upper extremity supported Standing balance-Leahy Scale: Good                   Standardized Balance Assessment Standardized Balance Assessment : Dynamic Gait Index   Dynamic Gait Index Level Surface: Mild Impairment Change in Gait Speed: Mild Impairment Gait with Horizontal Head Turns: Mild Impairment Gait with Vertical Head Turns: Mild Impairment Step Over Obstacle: Moderate Impairment      Cognition Arousal/Alertness: Awake/alert Behavior During Therapy: WFL for tasks assessed/performed Overall Cognitive Status: Impaired/Different from baseline Area of Impairment: Orientation;Awareness;Problem solving                 Orientation Level: Disoriented to;Place;Time;Situation Current Attention Level: Selective Memory: Decreased recall of precautions;Decreased short-term memory Following Commands: Follows one step commands with increased time Safety/Judgement: Decreased awareness of safety;Decreased awareness of deficits Awareness: Intellectual Problem Solving: Slow processing;Difficulty sequencing;Requires verbal cues;Requires tactile cues General Comments: Pt able to tell me she is at the hospital and tell me her sister's name (in the room), but cannot tell me the town.  She often gets frustrated when  she doesn't know the answer.               Pertinent Vitals/Pain Pain Assessment: No/denies pain           PT Goals (current goals can now be found in the care plan section) Acute Rehab PT Goals Patient Stated Goal: to go home Progress towards PT goals: Progressing toward goals    Frequency    Min 4X/week      PT Plan Current plan remains appropriate       AM-PAC PT "6 Clicks" Mobility   Outcome Measure  Help needed turning from your back to your side while in a flat bed without using bedrails?: None Help needed moving from lying on your back to sitting on the side of a flat bed without using bedrails?: A Little Help needed moving to and from a bed to a chair (including a wheelchair)?: A Little Help needed standing up from a chair using your arms (e.g., wheelchair or bedside chair)?: A Little Help needed to walk in hospital room?: A Little Help needed climbing 3-5 steps with a railing? : A Little 6 Click Score: 19    End of Session Equipment Utilized During Treatment: Gait belt Activity Tolerance: Patient tolerated treatment well Patient left: in chair;with call bell/phone within reach;with chair alarm set;with family/visitor present   PT Visit Diagnosis: Difficulty in walking, not elsewhere classified (R26.2);Other symptoms and signs involving the nervous system [id number])     Time: [id number] PT Time Calculation (min) (ACUTE ONLY): 24 min  Charges:  $Gait Training: 23-37 mins            Philopater Mucha B. Justeen Hehr, PT, DPT  Acute Rehabilitation [phone] pager [phone] office  @ Sheila Young: [phone]            12/02/2018, 6:22 PM

## 2018-12-02 NOTE — TOC Progression Note (Signed)
Transition of Care [facility]) - Progression Note    Patient Details  Name: Sheila Young MRN: [id number] Date of Birth: Jan 16, 1951  Transition of Care [facility]) CM/SW [place], [place] Phone Number: 12/02/2018, 3:20 PM  Clinical Narrative:   CSW received call from [place] with [facility] that patient's family is wanting her to be placed in ALF at discharge. Patient's sister attempted to drop off paperwork and was not allowed in to the hospital, [place] sent to [place] via email. CSW to complete and send back for patient to be considered for admission. CSW to follow.    Expected Discharge Plan: Home/Self Care Barriers to Discharge: Continued Medical Work up  Expected Discharge Plan and Services Expected Discharge Plan: Home/Self Care       Living arrangements for the past 2 months: Apartment                                       Social Determinants of Health (SDOH) Interventions    Readmission Risk Interventions No flowsheet data found.

## 2018-12-02 NOTE — TOC Progression Note (Signed)
Transition of Care [facility]) - Progression Note    Patient Details  Name: Rhealynn Edwards MRN: [id number] Date of Birth: 1950-08-25  Transition of Care [facility]) CM/SW [place], [place] Phone Number: 12/02/2018, 3:21 PM  Clinical Narrative:   CSW following for discharge plan. CSW completed referral paperwork for ALF, obtained MD signature, and faxed referral information to Marceline with [facility]. Patient will need to have a TB test, requested MD to place TB Skin test this morning. Patient will need TB test read prior to discharge to ALF. ALF reviewing referral paperwork and will call CSW back after review.    Expected Discharge Plan: Home/Self Care Barriers to Discharge: Continued Medical Work up  Expected Discharge Plan and Services Expected Discharge Plan: Home/Self Care       Living arrangements for the past 2 months: Apartment                                       Social Determinants of Health (SDOH) Interventions    Readmission Risk Interventions No flowsheet data found.

## 2018-12-02 NOTE — Progress Notes (Signed)
STROKE TEAM PROGRESS NOTE   INTERVAL HISTORY Pt lying in bed. Sister and PT in room. Pt has no complains. EEG showed no seizure but encephalopathy. Sister confirmed pt not compliant with meds at home but does not know what can be done as pt continues to have cognitive impairment.   Vitals:   12/01/18 2345 12/02/18 0300 12/02/18 0806 12/02/18 1207  BP: 130/67 127/71 (!) 152/64 (!) 130/59  Pulse: 66 67 (!) 59 63  Resp: 18 18 16 16   Temp: 98 F (36.7 C) 97.8 F (36.6 C) 97.7 F (36.5 C) 97.9 F (36.6 C)  TempSrc: Oral Oral Oral Oral  SpO2: 100% 100% 100% 99%  Weight:      Height:        CBC:  Recent Labs  Lab 11/30/18 1149 12/02/18 0327  WBC 11.2* 13.7*  NEUTROABS 8.3* 10.0*  HGB 11.3* 12.5  HCT 36.3 39.5  MCV 93.3 91.9  PLT 243 [id number]    Basic Metabolic Panel:  Recent Labs  Lab 11/30/18 1149 12/02/18 0327  NA 140 141  K 3.6 3.7  CL 108 107  CO2 22 22  GLUCOSE 99 102*  BUN 39* 26*  CREATININE 1.94* 1.40*  CALCIUM 9.7 9.7   Lipid Panel:     Component Value Date/Time   CHOL 175 12/01/2018 0217   TRIG 119 12/01/2018 0217   HDL 30 (L) 12/01/2018 0217   CHOLHDL 5.8 12/01/2018 0217   VLDL 24 12/01/2018 0217   LDLCALC 121 (H) 12/01/2018 0217   HgbA1c:  Lab Results  Component Value Date   HGBA1C 6.1 (H) 12/01/2018   Urine Drug Screen:     Component Value Date/Time   LABOPIA NONE DETECTED 11/30/2018 1232   COCAINSCRNUR NONE DETECTED 11/30/2018 1232   LABBENZ NONE DETECTED 11/30/2018 1232   AMPHETMU NONE DETECTED 11/30/2018 1232   THCU NONE DETECTED 11/30/2018 1232   LABBARB NONE DETECTED 11/30/2018 1232    Alcohol Level No results found for: [facility]  IMAGING Mr Angio Head Wo Contrast  Result Date: 12/01/2018 CLINICAL DATA:  Confusion.  Stroke, follow-up. EXAM: MRA NECK WITHOUT CONTRAST MRA HEAD WITHOUT CONTRAST TECHNIQUE: Multiplanar and multiecho pulse sequences of the neck were obtained without intravenous contrast. Angiographic images of the neck were  obtained using MRA technique without intravenous contast.; Angiographic images of the Circle of Willis were obtained using MRA technique without intravenous contrast. COMPARISON:  None. FINDINGS: MRA NECK FINDINGS Time-of-flight images demonstrate at fluid disruption of the proximal right internal carotid artery at the bifurcation. There is no distal reconstituted flow visible by time-of-flight imaging. The left common carotid artery and bifurcation is normal. Vertebral arteries originate from the subclavian arteries. There is antegrade flow in both vertebral arteries. No significant flow disturbance is present. MRA HEAD FINDINGS Time-of-flight images demonstrate no significant flow within the right internal carotid artery from the skull base through the ICA terminus. There is some irregularity in the left cavernous internal carotid artery without a significant stenosis relative to the more distal vessel. A1 and M1 segments demonstrate flow bilaterally. ACA vessels are normal bilaterally. There is some irregularity of MCA branch vessels. This may be artifactual. No significant proximal stenosis or occlusion is present. The vertebral arteries are hypoplastic bilaterally. The left vertebral artery terminates at the PICA. There is decreased flow signal within the basilar artery. PCA branch vessels are not well seen. Prominent left posterior communicating artery is present. 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 IMPRESSION: 1. No flow visible in the right  internal carotid artery from the bifurcation to the ICA terminus. 2. Normal flow in the left carotid artery through the ICA terminus. 3. Poor visualization of the posterior circulation on the MRA circle-of-Willis. The vessels appear hypoplastic. 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 4. 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 Electronically Signed   By: San Morelle M.D.   On: 12/01/2018 12:21   Mr Angio Neck Wo Contrast  Result Date:  12/01/2018 CLINICAL DATA:  Confusion.  Stroke, follow-up. EXAM: MRA NECK WITHOUT CONTRAST MRA HEAD WITHOUT CONTRAST TECHNIQUE: Multiplanar and multiecho pulse sequences of the neck were obtained without intravenous contrast. Angiographic images of the neck were obtained using MRA technique without intravenous contast.; Angiographic images of the Circle of Willis were obtained using MRA technique without intravenous contrast. COMPARISON:  None. FINDINGS: MRA NECK FINDINGS Time-of-flight images demonstrate at fluid disruption of the proximal right internal carotid artery at the bifurcation. There is no distal reconstituted flow visible by time-of-flight imaging. The left common carotid artery and bifurcation is normal. Vertebral arteries originate from the subclavian arteries. There is antegrade flow in both vertebral arteries. No significant flow disturbance is present. MRA HEAD FINDINGS Time-of-flight images demonstrate no significant flow within the right internal carotid artery from the skull base through the ICA terminus. There is some irregularity in the left cavernous internal carotid artery without a significant stenosis relative to the more distal vessel. A1 and M1 segments demonstrate flow bilaterally. ACA vessels are normal bilaterally. There is some irregularity of MCA branch vessels. This may be artifactual. No significant proximal stenosis or occlusion is present. The vertebral arteries are hypoplastic bilaterally. The left vertebral artery terminates at the PICA. There is decreased flow signal within the basilar artery. PCA branch vessels are not well seen. Prominent left posterior communicating artery is present. 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 IMPRESSION: 1. No flow visible in the right internal carotid artery from the bifurcation to the ICA terminus. 2. Normal flow in the left carotid artery through the ICA terminus. 3. Poor visualization of the posterior circulation on the MRA  circle-of-Willis. The vessels appear hypoplastic. 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 4. 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 Electronically Signed   By: San Morelle M.D.   On: 12/01/2018 12:21   Mr Brain Wo Contrast  Result Date: 12/01/2018 CLINICAL DATA:  Focal neurologic deficit. History of esophageal cancer. EXAM: MRI HEAD WITHOUT CONTRAST TECHNIQUE: Multiplanar, multiecho pulse sequences of the brain and surrounding structures were obtained without intravenous contrast. COMPARISON:  Head CT 11/30/2018 and brain MRI 05/08/2018 FINDINGS: BRAIN: There are multiple small foci of abnormal diffusion restriction within the right hemisphere, within the MCA and PCA territories. There is a single punctate focus of diffusion restriction in the left hemisphere near the hand motor area. There is an old infarct of the left PCA territory with associated encephalomalacia. Multiple old small vessel infarcts on the right. Multifocal white matter hyperintensity, most commonly due to chronic ischemic microangiopathy. The cerebral and cerebellar volume are age-appropriate. There is no hydrocephalus. The midline structures are normal. VASCULAR: Susceptibility-sensitive sequences show no chronic microhemorrhage or superficial siderosis. Abnormal right ICA flow void at the skull base. SKULL AND UPPER CERVICAL SPINE: Calvarial bone marrow signal is normal. There is no skull base mass. The visualized upper cervical spine and soft tissues are normal. SINUSES/ORBITS: There are no fluid levels or advanced mucosal thickening. The mastoid air cells and middle ear cavities are free of fluid. The orbits are normal. IMPRESSION: 1.  Multifocal acute ischemia, predominantly within the right hemisphere, within multiple vascular territories, possibly embolic. 2. Single punctate focus of ischemia within the left hemisphere along the left precentral gyrus. 3. Abnormal right ICA flow void at the skull base which may  indicate slow flow or occlusion. CTA or MRA may be helpful for further characterization. 4. Multiple old infarcts and findings of chronic small vessel ischemia. Electronically Signed   By: Ulyses Jarred M.D.   On: 12/01/2018 01:48    PHYSICAL EXAM    Temp:  [97.4 F (36.3 C)-98.1 F (36.7 C)] 97.9 F (36.6 C) (08/26 1207) Pulse Rate:  [59-67] 63 (08/26 1207) Resp:  [16-18] 16 (08/26 1207) BP: (127-152)/(59-71) 130/59 (08/26 1207) SpO2:  [99 %-100 %] 99 % (08/26 1207)  General - Well nourished, well developed, in no apparent distress.  Ophthalmologic - fundi not visualized due to noncooperation.  Cardiovascular - Regular rate and rhythm, not in A. fib or a flutter.  Mental Status -  Level of arousal and orientation to months, place, and person were intact, however not orientated to age and year. Language including expression, naming, repetition, comprehension was assessed and found intact.  Cranial Nerves II - XII - II - right hemianopia, likely chronic III, IV, VI - Extraocular movements intact. V - Facial sensation intact bilaterally. VII - Facial movement intact bilaterally. VIII - Hearing & vestibular intact bilaterally. X - Palate elevates symmetrically. XI - Chin turning & shoulder shrug intact bilaterally. XII - Tongue protrusion intact.  Motor Strength - The patient's strength was normal in all extremities and pronator drift was absent.  Bulk was normal and fasciculations were absent.   Motor Tone - Muscle tone was assessed at the neck and appendages and was normal.  Reflexes - The patient's reflexes were symmetrical in all extremities and she had no pathological reflexes.  Sensory - Light touch, temperature/pinprick were assessed and were symmetrical.    Coordination - The patient had normal movements in the hands with no ataxia or dysmetria.  Tremor was absent.  Gait and Station - deferred.   ASSESSMENT/PLAN Ms. Sheila Young is a 68 y.o. female with history  of esophageal cancer s/p Taxol/carboplatin and radiation with continuing dysphagia, hypertension, hypercholesterolemia, atrial flutter on Eliquis, stroke with unclear residual deficits, seizures on Keppra, strong suspicion for noncompliance with medications b/c she lives by herself and has had memory issues over the past few months to years presenting with L sided weakness and altered mental status.   Stroke:   Mult small to punctate R MCA, MCA/PCA, MCA/ACA and single L MCA punctate infarcts embolic secondary to right ICA occlusion and known AF not compliance with Eliquis   CT head No acute abnormality. Mult old infarcts, stable.  MRI  Mult R brain infarcts. Single L precentral gyrus infarct. Abnormal R ICA flow at skull base. muld old infarcts and small vessel disease.  Chronic left posterior MCA, left thalamic, right caudate head, bilateral CR/BG infarcts  MRA head and neck right ICA occlusion from bulb to terminal. Poor visualization of posterior circulation including PCAs and BA, hypoplastic vs. stenosis  2D Echo EF 55-60%  LDL 121  HgbA1c 6.1  HIV neg  SCDs for VTE prophylaxis  Eliquis (apixaban) daily prior to admission, now on  eliquis 5mg  bid as well as add ASA 81mg  for stroke prevention.   Therapy recommendations:  HH PT, HH OT, SLP  Disposition:  pending   Atrial Fibrillation  Home anticoagulation:  Eliquis (apixaban) daily - not  compliant as per sister Enid Derry  Last INR 1.3 . Rate controlled . Resume eliquis 5mg  bid . Continue Eliquis (apixaban) at discharge . Emphasize on medication compliance   Right ICA occlusion  New since 2017  11/2015 OSH CUS showed bilateral 1-39% stenosis  11/2015 OSH CTA head showed patent right ICA with atherosclerosis  This admission right ICA occlusion on MRA head and neck  Partially the reason for right brain infarcts  Not compliant with eliquis  Resume eliquis 5mg  bid and add ASA 81mg    Seizure d/o  On keppra PTA, ?  Compliance  keppra level on admission pending  Continued keepra 500mg  bid   EEG cortical dysfunction in the right temporal region d/t stroke.  No sz. Mild to mod encephalopathy  Vascular dementia  CT and MRI showed multiple old strokes in the past as well as acute stroke mainly on the right brain this admission  Disorientation, cognitive impairment  As per Eben Burow, patient has been difficulty with memory, cognition and intermittently agitated for some time  Consistent with vascular dementia  Follow-up with neurology as outpatient, consider Aricept and/or Namenda  Hypertension  Stable . Permissive hypertension (OK if < 180/105) but gradually normalize in 5-7 days . Long-term BP goal 130-150 due to right ICA occlusion  Hyperlipidemia  Home meds:  lipitor 40  LDL 121, goal < 70  Now on lipitor 80  Continue statin at discharge  Tobacco abuse  Current smoker  Smoking cessation counseling provided  Pt is willing to quit  Other Stroke Risk Factors  Advanced age  Medication noncompliance  Hx stroke/TIA  Other Active Problems  Hyperaldosteronism on spironolactone  CKD stage III - Cre 1.94  Hx esophageal cancer s/p chemo and XRT with resultant dysphagia  Hospital day # 1  Neurology will sign off. Please call with questions. Pt will follow up with stroke clinic NP at [facility] in about 4 weeks. Thanks for the consult.   Rosalin Hawking, MD PhD Stroke Neurology 12/02/2018 3:09 PM  To contact Stroke Continuity provider, please refer to [email or web address] After hours, contact General Neurology

## 2018-12-03 ENCOUNTER — Encounter (HOSPITAL_COMMUNITY): Payer: Self-pay | Admitting: General Practice

## 2018-12-03 LAB — LEVETIRACETAM LEVEL: Levetiracetam Lvl: 26.4 ug/mL (ref 10.0–40.0)

## 2018-12-03 NOTE — Progress Notes (Signed)
Occupational Therapy Treatment Patient Details Name: Sheila Young MRN: [id number] DOB: [birth year] Today's Date: 12/03/2018    History of present illness 68 y.o. female admitted on 11/30/18 for confusion and L sided weakness.  Neuro consulted for concern of TIA/stroke.  Workup is still pending.  MRI revealed multifocal acute ischemia R hemispheere, single punctate focus of sicehmia in L hemisphere and abnormal R ICA flow.  Pt with significant PMH of stroke, seizure, radiation, HTN, and esophagus CA.   OT comments  Treatment session with focus on safety awareness and cognition during self-care tasks.  Upon standing from recliner, noted pt to have been incontinent of bowel. Pt ambulated to toilet with min guard without AD with cues due to impulsivity.  Pt completed toileting with min assist for standing balance while completing extensive hygiene.  Completed LB dressing with donning underwear and pants with min cues for sequencing and min assist to thread LLE.    Due to impaired cognition and still requiring min assist for self-care tasks and min assist - min guard with mobility, this therapist feels that pt would benefit from short SNF to ensure increased safety upon d/c home as family currently unable to provide 24/7 supervision.  Follow Up Recommendations  SNF    Equipment Recommendations    defer to next venue of care      Precautions / Restrictions Precautions Precautions: Fall Precaution Comments: unsteady on her feet, cues for safety       Mobility Bed Mobility               General bed mobility comments: pt received up in recliner, returned to recliner  Transfers Overall transfer level: Needs assistance Equipment used: None;1 person hand held assist Transfers: Sit to/from Stand Sit to Stand: Min guard;Min assist         General transfer comment: min guard to stand and then min A upon standing for balance        ADL either performed or assessed with clinical  judgement   ADL Overall ADL's : Needs assistance/impaired     Grooming: Minimal assistance;Standing       Lower Body Bathing: Minimal assistance;Sit to/from stand;Sitting/lateral leans       Lower Body Dressing: Minimal assistance;Sitting/lateral leans;Sit to/from stand   Toilet Transfer: Minimal assistance;Regular Toilet;Grab bars   Toileting- Water quality scientist and Hygiene: Min guard         General ADL Comments: pt overall limited 2/2 to cognitive issues and decreased activity tolerance, therapist providing cues for safety during hygiene and LB dressing     Vision Patient Visual Report: No change from baseline            Cognition Arousal/Alertness: Awake/alert Behavior During Therapy: WFL for tasks assessed/performed Overall Cognitive Status: Impaired/Different from baseline Area of Impairment: Orientation;Awareness;Problem solving;Attention;Safety/judgement                 Orientation Level: Disoriented to;Place;Situation;Time Current Attention Level: Selective   Following Commands: Follows one step commands with increased time Safety/Judgement: Decreased awareness of safety;Decreased awareness of deficits Awareness: Intellectual Problem Solving: Slow processing;Difficulty sequencing;Requires verbal cues                     Pertinent Vitals/ Pain       Pain Assessment: No/denies pain         Frequency  Min 2X/week        Progress Toward Goals  OT Goals(current goals can now be found in the care plan section)  Progress towards OT goals: Progressing toward goals  Acute Rehab OT Goals Patient Stated Goal: to go home OT Goal Formulation: With patient Time For Goal Achievement: 12/15/18 Potential to Achieve Goals: Good  Plan Discharge plan needs to be updated       AM-PAC OT "6 Clicks" Daily Activity     Outcome Measure   Help from another person eating meals?: None Help from another person taking care of personal grooming?:  A Little Help from another person toileting, which includes using toliet, bedpan, or urinal?: A Little Help from another person bathing (including washing, rinsing, drying)?: A Little Help from another person to put on and taking off regular upper body clothing?: A Little Help from another person to put on and taking off regular lower body clothing?: A Little 6 Click Score: 19    End of Session Equipment Utilized During Treatment: Gait belt  OT Visit Diagnosis: Unsteadiness on feet (R26.81);Other symptoms and signs involving cognitive function   Activity Tolerance Patient tolerated treatment well   Patient Left with call bell/phone within reach;in chair;with chair alarm set   Nurse Communication Mobility status        Time: [id number] OT Time Calculation (min): 24 min  Charges: OT General Charges $OT Visit: 1 Visit OT Treatments $Self Care/Home Management : 23-37 mins    Simonne Come, [id number] 12/03/2018, 3:22 PM

## 2018-12-03 NOTE — Progress Notes (Signed)
Physical Therapy Treatment Patient Details Name: Sheila Young MRN: [id number] DOB: 1950-06-30 Today's Date: 12/03/2018    History of Present Illness 68 y.o. female admitted on 11/30/18 for confusion and L sided weakness.  Neuro consulted for concern of TIA/stroke.  Workup is still pending.  MRI revealed multifocal acute ischemia R hemispheere, single punctate focus of sicehmia in L hemisphere and abnormal R ICA flow.  Pt with significant PMH of stroke, seizure, radiation, HTN, and esophagus CA.    PT Comments    Patient seen for mobility progression. Pt is pleasant and agreeable to participate in therapy. Pt requires min guard/min A for OOB mobility and with DGI score of 17/24 indicating risk for falls.  Current plan remains appropriate.   Follow Up Recommendations  Home health PT;Supervision/Assistance - 24 hour     Equipment Recommendations  None recommended by PT    Recommendations for Other Services       Precautions / Restrictions Precautions Precautions: Fall Restrictions Weight Bearing Restrictions: No    Mobility  Bed Mobility Overal bed mobility: Modified Independent Bed Mobility: Supine to Sit           General bed mobility comments: increased time; pt came into long sitting and then brought bilat LE to EOB   Transfers Overall transfer level: Needs assistance Equipment used: None;1 person hand held assist Transfers: Sit to/from Stand Sit to Stand: Min guard;Min assist         General transfer comment: min guard to stand and then min A upon standing for balance  Ambulation/Gait Ambulation/Gait assistance: Min guard;Min assist Gait Distance (Feet): 300 Feet Assistive device: None(assist at trunk with gait belt at times ) Gait Pattern/deviations: Step-through pattern;Decreased stride length Gait velocity: decreased   General Gait Details: pt with decreased cadence and stride length initially but with distance began improving; mildly unsteady without  LOB   Stairs             Wheelchair Mobility    Modified Rankin (Stroke Patients Only) Modified Rankin (Stroke Patients Only) Pre-Morbid Rankin Score: Moderate disability Modified Rankin: Moderately severe disability     Balance Overall balance assessment: Needs assistance Sitting-balance support: Feet supported;No upper extremity supported Sitting balance-Leahy Scale: Good     Standing balance support: No upper extremity supported Standing balance-Leahy Scale: Fair                   Standardized Balance Assessment Standardized Balance Assessment : Dynamic Gait Index   Dynamic Gait Index Level Surface: Mild Impairment Change in Gait Speed: Mild Impairment Gait with Horizontal Head Turns: Mild Impairment Gait with Vertical Head Turns: Mild Impairment Gait and Pivot Turn: Normal Step Over Obstacle: Mild Impairment Step Around Obstacles: Mild Impairment Steps: Mild Impairment Total Score: 17      Cognition Arousal/Alertness: Awake/alert Behavior During Therapy: WFL for tasks assessed/performed Overall Cognitive Status: Impaired/Different from baseline Area of Impairment: Orientation;Awareness;Problem solving;Attention                 Orientation Level: Disoriented to;Place;Situation;Time Current Attention Level: Selective       Awareness: Intellectual Problem Solving: Slow processing;Difficulty sequencing;Requires verbal cues General Comments: pt is able to state todays date if she looks at the clock in her room and that we are at the hospital; unable to recall city or name of hospital later in session after being oriented       Exercises      General Comments        Pertinent  Vitals/Pain Pain Assessment: No/denies pain    Home Living Family/patient expects to be discharged to:: Unsure Living Arrangements: Alone                  Prior Function            PT Goals (current goals can now be found in the care plan section)  Acute Rehab PT Goals Patient Stated Goal: to go home Progress towards PT goals: Progressing toward goals    Frequency    Min 4X/week      PT Plan Current plan remains appropriate    Co-evaluation              AM-PAC PT "6 Clicks" Mobility   Outcome Measure  Help needed turning from your back to your side while in a flat bed without using bedrails?: None Help needed moving from lying on your back to sitting on the side of a flat bed without using bedrails?: A Little Help needed moving to and from a bed to a chair (including a wheelchair)?: A Little Help needed standing up from a chair using your arms (e.g., wheelchair or bedside chair)?: A Little Help needed to walk in hospital room?: A Little Help needed climbing 3-5 steps with a railing? : A Little 6 Click Score: 19    End of Session Equipment Utilized During Treatment: Gait belt Activity Tolerance: Patient tolerated treatment well Patient left: in chair;with call bell/phone within reach;with chair alarm set Nurse Communication: Mobility status PT Visit Diagnosis: Difficulty in walking, not elsewhere classified (R26.2);Other symptoms and signs involving the nervous system [id number])     Time: [id number] PT Time Calculation (min) (ACUTE ONLY): 34 min  Charges:  $Gait Training: 23-37 mins                     Earney Navy, PTA Acute Rehabilitation Services Pager: [phone] Office: [phone]     Darliss Cheney 12/03/2018, 11:27 AM

## 2018-12-03 NOTE — Progress Notes (Signed)
  Speech Language Pathology Treatment: Cognitive-Linquistic  Patient Details Name: Sheila Young MRN: [id number] DOB: [birth year] Today's Date: 12/03/2018 Time: 1206-1230 SLP Time Calculation (min) (ACUTE ONLY): 24 min  Assessment / Plan / Recommendation Clinical Impression  Pt was seen for cognitive-linguistic treatment and was cooperative during the session with some encouragement. She reported that her cognition and motor speech skills are currently at baseline and she demonstrated reduced awareness of her deficits. She frequently responded, "I don't know" to questions even after additional processing time was provided. Pt was educated regarding the deficits which were noted during her evaluation yesterday and she verbalized understanding but stated that she was not concerned about them. She was able to provide the accurate month and date but required cues for the day and year. She was able to accurately recall what she had for breakfast. She demonstrated 60% accuracy with 4-item immediate recall increasing to 100% with mod cues. She consistently required cues for recall of 5 items. She achieved 60% accuracy with abstract reasoning increasing to 100% with min-mod cues. She required mod-max support for completion of a medication management (prescription) task. SLP will continue to follow pt.    HPI HPI: Sheila Young is a 68 y.o. female with medical history of CVA; seizures; esophageal CA dysphagia, hyperaldosteronism chronic anemia. She was admitted with altered mental status and L side weakness. MRI Multifocal acute ischemia, predominantly within the right hemisphere, within multiple vascular territories, possibly embolic Single punctate focus of ischemia within the left hemisphere along the precentral gyrus, Multiple old infarcts and findings of chronic small vessel ischemia. Esophagram performed 06/25/17 showing slightly irregular stricture of the distal esophagus measuring up to 8 mm in maximal  diameter, spanning a length of 3.6 cm, likely secondary to prior radiation. A 13 mm barium tablet would not pass through the stricture. 10/15/2017 MBS showed oral and pharyngeal swallow fx WNL. CXR 11/30/18 Stable mild cardiomegaly. No acute cardiopulmonary disease.       SLP Plan  Continue with current plan of care       Recommendations                   Follow up Recommendations: Home health SLP;24 hour supervision/assistance SLP Visit Diagnosis: Cognitive communication deficit [id number]) Plan: Continue with current plan of care       Jeris Easterly I. Hardin Negus, [facility], [place] Office number [phone] Pager [place] 12/03/2018, 12:33 PM

## 2018-12-03 NOTE — Progress Notes (Signed)
PROGRESS NOTE    Sheila Young  [id number] DOB: 1950/12/02 DOA: 11/30/2018 PCP: Beckie Salts, MD    Brief Narrative:  68 year old female with history of stroke, cognitive dysfunction, esophageal carcinoma with dysphagia, hyperaldosteronism and chronic anemia, chronic kidney disease stage III presented to the hospital on sister's request who noticed that she has been more slow to respond for last few days.  Patient does have history of seizure.  She does have cognitive dysfunction and might have problem of taking medications.  one of the sister thinks she might have missed medications for last 4 days PTA.  Patient is recently diagnosed with A. fib and she is on Eliquis for that.   Assessment & Plan:   Active Problems:   Esophagus cancer (HCC)   Mechanical dysphagia   Protein-calorie malnutrition, severe (HCC)   Seizure (HCC)   Atrial flutter (HCC)   Hyperaldosteronism (HCC)   TIA (transient ischemic attack)   Acute metabolic encephalopathy   CVA (cerebral vascular accident) ([place])  Acute ischemic stroke, multiple territory possibly embolic: With acute metabolic encephalopathy in the setting of underlying chronic cognitive dysfunction.  Clinical findings, more confusion than usual. CT head findings, normal. MRI of the brain, multiple areas of ischemia likely embolic source.  MRA of the head and neck shows complete right ICA blockage.   2D echocardiogram, normal.  No PFO. Antiplatelet therapy, on Eliquis at home, noncompliant.  Recommended Eliquis along with aspirin 81 mg daily. LDL 121, on atorvastatin 40 mg at home, increased to 80 mg daily.   Hemoglobin A1c, 6.1 DVT prophylaxis, Eliquis Therapy recommendations, home health PT. Followed by neurology, speech, PT OT.  Seizure disorder: On Keppra at home.  No evidence of new seizure.  Keppra continued.  Levels pending.  Paroxysmal A. Fib: Currently rate controlled sinus rhythm.  On metoprolol.  Therapeutic on Eliquis.   Vascular dementia: Lives at senior living apartment.  Now she will be going to assisted living place.  Acute kidney injury on chronic kidney disease stage III: Baseline creatinine about 1.2.  Improving levels.Encourage oral liquids.   DVT prophylaxis: Eliquis Code Status: Full code Family Communication: Sister [name] Disposition Plan: Assisted living facility with PT OT when arrangements are made.   Consultants:   Neurology  Procedures:   None  Antimicrobials:   None   Subjective: Patient seen and examined. She denies any complaints.  No other overnight events.  Objective: Vitals:   12/02/18 2316 12/03/18 0321 12/03/18 0756 12/03/18 1126  BP: 139/61 (!) 154/67 (!) 162/65 (!) 107/55  Pulse: 67 60 61 72  Resp: 18 18 17 15   Temp: 97.8 F (36.6 C) 97.6 F (36.4 C) 98.1 F (36.7 C) 98.3 F (36.8 C)  TempSrc: Oral Oral Axillary Oral  SpO2: 100% 100% 100% 100%  Weight:      Height:        Intake/Output Summary (Last 24 hours) at 12/03/2018 1323 Last data filed at 12/03/2018 0836 Gross per 24 hour  Intake 60 ml  Output -  Net 60 ml   Filed Weights   11/30/18 2219  Weight: 81 kg    Examination:  General exam: Appears calm and comfortable  Respiratory system: Clear to auscultation. Respiratory effort normal. Cardiovascular system: S1 & S2 heard, RRR. No JVD, murmurs, rubs, gallops or clicks. No pedal edema. Gastrointestinal system: Abdomen is nondistended, soft and nontender. No organomegaly or masses felt. Normal bowel sounds heard. Central nervous system: Alert and oriented x2.  Possibly she has some hemianopia on the  right side, unreliable historian.  No other focal neurological deficits.  Severe cognitive dysfunction. Extremities: Symmetric 5 x 5 power. Skin: No rashes, lesions or ulcers Psychiatry: Judgement and insight appear normal. Mood & affect appropriate.     Data Reviewed: I have personally reviewed following labs and imaging studies  CBC:  Recent Labs  Lab 11/30/18 1149 12/02/18 0327  WBC 11.2* 13.7*  NEUTROABS 8.3* 10.0*  HGB 11.3* 12.5  HCT 36.3 39.5  MCV 93.3 91.9  PLT 243 [id number]   Basic Metabolic Panel: Recent Labs  Lab 11/30/18 1149 12/02/18 0327  NA 140 141  K 3.6 3.7  CL 108 107  CO2 22 22  GLUCOSE 99 102*  BUN 39* 26*  CREATININE 1.94* 1.40*  CALCIUM 9.7 9.7   GFR: Estimated Creatinine Clearance: 41.9 mL/min (A) (by C-G formula based on SCr of 1.4 mg/dL (H)). Liver Function Tests: Recent Labs  Lab 11/30/18 1149  AST 15  ALT 14  ALKPHOS 62  BILITOT 0.6  PROT 6.9  ALBUMIN 3.6   No results for input(s): LIPASE, AMYLASE in the last 168 hours. Recent Labs  Lab 11/30/18 1232  AMMONIA 18   Coagulation Profile: Recent Labs  Lab 11/30/18 1149  INR 1.3*   Cardiac Enzymes: No results for input(s): CKTOTAL, CKMB, CKMBINDEX, TROPONINI in the last 168 hours. BNP (last 3 results) No results for input(s): PROBNP in the last 8760 hours. HbA1C: Recent Labs    12/01/18 0217  HGBA1C 6.1*   CBG: Recent Labs  Lab 11/30/18 1214  GLUCAP 82   Lipid Profile: Recent Labs    12/01/18 0217  CHOL 175  HDL 30*  LDLCALC 121*  TRIG 119  CHOLHDL 5.8   Thyroid Function Tests: No results for input(s): TSH, T4TOTAL, FREET4, T3FREE, THYROIDAB in the last 72 hours. Anemia Panel: Recent Labs    12/01/18 0327  VITAMINB12 590  FOLATE 14.0  FERRITIN 115  TIBC 276  IRON 52  RETICCTPCT 1.7   Sepsis Labs: No results for input(s): PROCALCITON, LATICACIDVEN in the last 168 hours.  Recent Results (from the past 240 hour(s))  SARS CORONAVIRUS 2 (TAT 6-12 HRS) Nasal Swab Aptima Multi Swab     Status: None   Collection Time: 11/30/18  2:01 PM   Specimen: Aptima Multi Swab; Nasal Swab  Result Value Ref Range Status   SARS Coronavirus 2 NEGATIVE NEGATIVE Final    Comment: (NOTE) SARS-CoV-2 target nucleic acids are NOT DETECTED. The SARS-CoV-2 RNA is generally detectable in upper and lower  respiratory specimens during the acute phase of infection. Negative results do not preclude SARS-CoV-2 infection, do not rule out co-infections with other pathogens, and should not be used as the sole basis for treatment or other patient management decisions. Negative results must be combined with clinical observations, patient history, and epidemiological information. The expected result is Negative. Fact Sheet for Patients: SugarRoll.be Fact Sheet for Healthcare Providers: https://www.woods-mathews.com/ This test is not yet approved or cleared by the [place] FDA and  has been authorized for detection and/or diagnosis of SARS-CoV-2 by FDA under an Emergency Use Authorization (EUA). This EUA will remain  in effect (meaning this test can be used) for the duration of the COVID-19 declaration under Section 56 4(b)(1) of the Act, 21 U.S.C. section 360bbb-3(b)(1), unless the authorization is terminated or revoked sooner. Performed at [facility], [place] [address] [place], [place] [address]          Radiology Studies: No results found.  Scheduled Meds: . apixaban  5 mg Oral BID  . aspirin EC  81 mg Oral Daily  . atorvastatin  80 mg Oral q1800  . feeding supplement (ENSURE ENLIVE)  237 mL Oral BID BM  . FLUoxetine  10 mg Oral Daily  . levETIRAcetam  500 mg Oral BID  . multivitamin with minerals  1 tablet Oral Daily  . pantoprazole  40 mg Oral Daily  . spironolactone  50 mg Oral Daily  . tuberculin  5 Units Intradermal Once   Continuous Infusions:    LOS: 2 days    Time spent: 25 minutes     Barb Merino, MD [facility] Pager [phone]  If 7PM-7AM, please contact night-coverage [email or web address] Password [email or web address] 12/03/2018, 1:23 PM

## 2018-12-04 NOTE — TOC Progression Note (Signed)
Transition of Care [facility]) - Progression Note    Patient Details  Name: Sheila Young MRN: [id number] Date of Birth: [birth year]  Transition of Care [facility]) CM/SW [place], [place] Phone Number: 12/04/2018, 4:33 PM  Clinical Narrative:   CSW received call from [place] at [facility] about patient's referral. Due to severely low MOCA score from SLP session, they are unable to accept the patient at this time; recommending SNF for rehab first. CSW indicated that PT and OT had indicated home health while SLP had indicated CIR, needing an additional therapy evaluation. PT earlier today still recommending home health, CSW spoke with OT to discuss session. Patient needing more cognitive cues, unsafe to be home alone, could benefit from SNF. CSW attempted to contact patient's sister, [name], to discuss and left a voicemail. CSW to follow.    Expected Discharge Plan: Home/Self Care Barriers to Discharge: Continued Medical Work up  Expected Discharge Plan and Services Expected Discharge Plan: Home/Self Care       Living arrangements for the past 2 months: Apartment                                       Social Determinants of Health (SDOH) Interventions    Readmission Risk Interventions No flowsheet data found.

## 2018-12-04 NOTE — Progress Notes (Signed)
Physical Therapy Treatment Patient Details Name: Sheila Young MRN: [id number] DOB: [birth year] Today's Date: 12/04/2018    History of Present Illness 68 y.o. female admitted on 11/30/18 for confusion and L sided weakness.  Neuro consulted for concern of TIA/stroke.  Workup is still pending.  MRI revealed multifocal acute ischemia R hemispheere, single punctate focus of sicehmia in L hemisphere and abnormal R ICA flow.  Pt with significant PMH of stroke, seizure, radiation, HTN, and esophagus CA.    PT Comments    Patient seen for mobility progression. Pt continues to demonstrate cognitive deficits and disoriented to time, situation, and name of hospital or city. Overall min guard for mobility this session. Continue to progress as tolerated.   Follow Up Recommendations  Home health PT;Supervision/Assistance - 24 hour     Equipment Recommendations  None recommended by PT    Recommendations for Other Services       Precautions / Restrictions Precautions Precautions: Fall Restrictions Weight Bearing Restrictions: No    Mobility  Bed Mobility Overal bed mobility: Modified Independent Bed Mobility: Supine to Sit              Transfers Overall transfer level: Needs assistance Equipment used: None;1 person hand held assist Transfers: Sit to/from Stand Sit to Stand: Min guard         General transfer comment: min guard for safety  Ambulation/Gait Ambulation/Gait assistance: Min guard Gait Distance (Feet): 300 Feet Assistive device: None Gait Pattern/deviations: Step-through pattern;Decreased stride length Gait velocity: decreased   General Gait Details: steady gait, guarded movements at times; no significant gait deviations with horizontal head turns   Chief Strategy Officer    Modified Rankin (Stroke Patients Only) Modified Rankin (Stroke Patients Only) Pre-Morbid Rankin Score: Moderate disability Modified Rankin: Moderately severe  disability     Balance Overall balance assessment: Needs assistance Sitting-balance support: Feet supported;No upper extremity supported Sitting balance-Leahy Scale: Good     Standing balance support: No upper extremity supported Standing balance-Leahy Scale: Fair                              Cognition Arousal/Alertness: Awake/alert Behavior During Therapy: WFL for tasks assessed/performed Overall Cognitive Status: No family/caregiver present to determine baseline cognitive functioning Area of Impairment: Orientation;Memory;Safety/judgement;Problem solving                 Orientation Level: Disoriented to;Place;Time;Situation   Memory: Decreased short-term memory   Safety/Judgement: Decreased awareness of safety   Problem Solving: Slow processing;Difficulty sequencing;Requires verbal cues        Exercises      General Comments        Pertinent Vitals/Pain Pain Assessment: No/denies pain    Home Living                      Prior Function            PT Goals (current goals can now be found in the care plan section) Acute Rehab PT Goals Patient Stated Goal: to go home Progress towards PT goals: Progressing toward goals    Frequency    Min 4X/week      PT Plan Current plan remains appropriate    Co-evaluation              AM-PAC PT "6 Clicks" Mobility   Outcome Measure  Help needed turning from  your back to your side while in a flat bed without using bedrails?: None Help needed moving from lying on your back to sitting on the side of a flat bed without using bedrails?: A Little Help needed moving to and from a bed to a chair (including a wheelchair)?: A Little Help needed standing up from a chair using your arms (e.g., wheelchair or bedside chair)?: A Little Help needed to walk in hospital room?: A Little Help needed climbing 3-5 steps with a railing? : A Little 6 Click Score: 19    End of Session Equipment  Utilized During Treatment: Gait belt Activity Tolerance: Patient tolerated treatment well Patient left: in chair;with call bell/phone within reach;with chair alarm set Nurse Communication: Mobility status PT Visit Diagnosis: Difficulty in walking, not elsewhere classified (R26.2);Other symptoms and signs involving the nervous system (R29.898)     Time: 1030-1055 PT Time Calculation (min) (ACUTE ONLY): 25 min  Charges:  $Gait Training: 23-37 mins                     Earney Navy, PTA Acute Rehabilitation Services Pager: [phone] Office: [phone]     Darliss Cheney 12/04/2018, 4:44 PM

## 2018-12-04 NOTE — TOC Progression Note (Signed)
Transition of Care [facility]) - Progression Note    Patient Details  Name: Sheila Young MRN: [id number] Date of Birth: [birth year]  Transition of Care [facility]) CM/SW [place], [place] Phone Number: 12/04/2018, 4:35 PM  Clinical Narrative:   CSW spoke with patient's sister, Sheila Young, today to discuss discharge plans. CSW explained updated recommendations for SNF placement for rehab prior to either home vs ALF, pending her progress. Barbara in agreement. CSW provided list of facilities within the family's geographical area, and CSW to follow up with specific availability after referrals have been reviewed. Sheila Young asked about insurance coverage and transportation to SNF, and CSW answered questions.    Expected Discharge Plan: Home/Self Care Barriers to Discharge: Continued Medical Work up  Expected Discharge Plan and Services Expected Discharge Plan: Home/Self Care       Living arrangements for the past 2 months: Apartment                                       Social Determinants of Health (SDOH) Interventions    Readmission Risk Interventions No flowsheet data found.

## 2018-12-04 NOTE — Care Management Important Message (Signed)
Important Message  Patient Details  Name: Sheila Young MRN: [id number] Date of Birth: 26-Aug-1950   Medicare Important Message Given:  Yes     Carnella Fryman 12/04/2018, 2:12 PM

## 2018-12-04 NOTE — Progress Notes (Signed)
PROGRESS NOTE    Sheila Young  [id number] DOB: 1950-11-25 DOA: 11/30/2018 PCP: Beckie Salts, MD    Brief Narrative:  68 year old female with history of stroke, cognitive dysfunction, esophageal carcinoma with dysphagia, hyperaldosteronism and chronic anemia, chronic kidney disease stage III presented to the hospital on sister's request who noticed that she has been more slow to respond for last few days.  Patient does have history of seizure.  She does have cognitive dysfunction and might have problem of taking medications.  one of the sister thinks she might have missed medications for last 4 days PTA.  Patient is recently diagnosed with A. fib and she is on Eliquis for that.   Assessment & Plan:   Active Problems:   Esophagus cancer (HCC)   Mechanical dysphagia   Protein-calorie malnutrition, severe (HCC)   Seizure (HCC)   Atrial flutter (HCC)   Hyperaldosteronism (HCC)   TIA (transient ischemic attack)   Acute metabolic encephalopathy   CVA (cerebral vascular accident) ([place])  Acute ischemic stroke, multiple territory possibly embolic: With acute metabolic encephalopathy in the setting of underlying chronic cognitive dysfunction.  Clinical findings, more confusion than usual. Improved now.  CT head findings, normal. MRI of the brain, multiple areas of ischemia likely embolic source.  MRA of the head and neck shows complete right ICA blockage.   2D echocardiogram, normal.  No PFO. Antiplatelet therapy, on Eliquis at home, noncompliant.  Recommended Eliquis along with aspirin 81 mg daily. LDL 121, on atorvastatin 40 mg at home, increased to 80 mg daily.   Hemoglobin A1c, 6.1 DVT prophylaxis, Eliquis Therapy recommendations, SNF and ultimately ALF from SNF.  Seizure disorder: On Keppra at home.  No evidence of new seizure.  Keppra continued.  Levels 26.  Therapeutic.  Paroxysmal A. Fib: Currently rate controlled sinus rhythm.  On metoprolol.  Therapeutic on Eliquis.   Vascular dementia: Lives at senior living apartment.  Now she will be going need assisted living place.  Family arranging assisted living facility.  However, at this time she will go to skilled nursing.  Acute kidney injury on chronic kidney disease stage III: Baseline creatinine about 1.2.  Improving levels.Encourage oral liquids.   DVT prophylaxis: Eliquis Code Status: Full code Family Communication: Sister [name] Disposition Plan: Medically stable.  Transfer to skilled level of care when available.   Consultants:   Neurology  Procedures:   None  Antimicrobials:   None   Subjective: Patient seen and examined. She denies any complaints.  No other overnight events.  Objective: Vitals:   12/03/18 2326 12/04/18 0302 12/04/18 0850 12/04/18 1119  BP: (!) 156/90 (!) 151/73 140/70 116/73  Pulse: 67 65 62 75  Resp: 18 18 18 20   Temp: 97.6 F (36.4 C) 97.6 F (36.4 C) (!) 97.4 F (36.3 C) 98.2 F (36.8 C)  TempSrc: Oral Oral Oral Oral  SpO2: 100% 99% 100% 98%  Weight:      Height:        Intake/Output Summary (Last 24 hours) at 12/04/2018 1301 Last data filed at 12/03/2018 2100 Gross per 24 hour  Intake 340 ml  Output -  Net 340 ml   Filed Weights   11/30/18 2219  Weight: 81 kg    Examination:  General exam: Appears calm and comfortable, comfortable on room air.  She is alert awake, eating her breakfast.  She is mostly oriented to person and place but not time, however has cognitive dysfunction. Respiratory system: Clear to auscultation. Respiratory effort normal. Cardiovascular system:  S1 & S2 heard, RRR. No JVD, murmurs, rubs, gallops or clicks. No pedal edema. Gastrointestinal system: Abdomen is nondistended, soft and nontender. No organomegaly or masses felt. Normal bowel sounds heard. Central nervous system: Alert and oriented x2.  Possibly she has some hemianopia on the right side, unreliable historian.  No other focal neurological deficits.  Severe  cognitive dysfunction. Extremities: Symmetric 5 x 5 power. Skin: No rashes, lesions or ulcers Psychiatry: Judgement and insight appear normal. Mood & affect appropriate.     Data Reviewed: I have personally reviewed following labs and imaging studies  CBC: Recent Labs  Lab 11/30/18 1149 12/02/18 0327  WBC 11.2* 13.7*  NEUTROABS 8.3* 10.0*  HGB 11.3* 12.5  HCT 36.3 39.5  MCV 93.3 91.9  PLT 243 [id number]   Basic Metabolic Panel: Recent Labs  Lab 11/30/18 1149 12/02/18 0327  NA 140 141  K 3.6 3.7  CL 108 107  CO2 22 22  GLUCOSE 99 102*  BUN 39* 26*  CREATININE 1.94* 1.40*  CALCIUM 9.7 9.7   GFR: Estimated Creatinine Clearance: 41.9 mL/min (A) (by C-G formula based on SCr of 1.4 mg/dL (H)). Liver Function Tests: Recent Labs  Lab 11/30/18 1149  AST 15  ALT 14  ALKPHOS 62  BILITOT 0.6  PROT 6.9  ALBUMIN 3.6   No results for input(s): LIPASE, AMYLASE in the last 168 hours. Recent Labs  Lab 11/30/18 1232  AMMONIA 18   Coagulation Profile: Recent Labs  Lab 11/30/18 1149  INR 1.3*   Cardiac Enzymes: No results for input(s): CKTOTAL, CKMB, CKMBINDEX, TROPONINI in the last 168 hours. BNP (last 3 results) No results for input(s): PROBNP in the last 8760 hours. HbA1C: No results for input(s): HGBA1C in the last 72 hours. CBG: Recent Labs  Lab 11/30/18 1214  GLUCAP 82   Lipid Profile: No results for input(s): CHOL, HDL, LDLCALC, TRIG, CHOLHDL, LDLDIRECT in the last 72 hours. Thyroid Function Tests: No results for input(s): TSH, T4TOTAL, FREET4, T3FREE, THYROIDAB in the last 72 hours. Anemia Panel: No results for input(s): VITAMINB12, FOLATE, FERRITIN, TIBC, IRON, RETICCTPCT in the last 72 hours. Sepsis Labs: No results for input(s): PROCALCITON, LATICACIDVEN in the last 168 hours.  Recent Results (from the past 240 hour(s))  SARS CORONAVIRUS 2 (TAT 6-12 HRS) Nasal Swab Aptima Multi Swab     Status: None   Collection Time: 11/30/18  2:01 PM   Specimen:  Aptima Multi Swab; Nasal Swab  Result Value Ref Range Status   SARS Coronavirus 2 NEGATIVE NEGATIVE Final    Comment: (NOTE) SARS-CoV-2 target nucleic acids are NOT DETECTED. The SARS-CoV-2 RNA is generally detectable in upper and lower respiratory specimens during the acute phase of infection. Negative results do not preclude SARS-CoV-2 infection, do not rule out co-infections with other pathogens, and should not be used as the sole basis for treatment or other patient management decisions. Negative results must be combined with clinical observations, patient history, and epidemiological information. The expected result is Negative. Fact Sheet for Patients: SugarRoll.be Fact Sheet for Healthcare Providers: https://www.woods-mathews.com/ This test is not yet approved or cleared by the [place] FDA and  has been authorized for detection and/or diagnosis of SARS-CoV-2 by FDA under an Emergency Use Authorization (EUA). This EUA will remain  in effect (meaning this test can be used) for the duration of the COVID-19 declaration under Section 56 4(b)(1) of the Act, 21 U.S.C. section 360bbb-3(b)(1), unless the authorization is terminated or revoked sooner. Performed at [facility]  Lab, [address], [place],  [address]          Radiology Studies: No results found.      Scheduled Meds: . apixaban  5 mg Oral BID  . aspirin EC  81 mg Oral Daily  . atorvastatin  80 mg Oral q1800  . feeding supplement (ENSURE ENLIVE)  237 mL Oral BID BM  . FLUoxetine  10 mg Oral Daily  . levETIRAcetam  500 mg Oral BID  . multivitamin with minerals  1 tablet Oral Daily  . pantoprazole  40 mg Oral Daily  . spironolactone  50 mg Oral Daily  . tuberculin  5 Units Intradermal Once   Continuous Infusions:    LOS: 3 days    Time spent: 25 minutes     Barb Merino, MD [facility] Pager [phone]  If 7PM-7AM, please  contact night-coverage [email or web address] Password [email or web address] 12/04/2018, 1:01 PM

## 2018-12-04 NOTE — NC FL2 (Signed)
[place] MEDICAID FL2 LEVEL OF CARE SCREENING TOOL     IDENTIFICATION  Patient Name: Sheila Young Birthdate: [birth year] Sex: female Admission Date (Current Location): 11/30/2018  [facility] and [place] Number:  Herbalist and Address:  [facility], [place] [address] [place], [place] [address]      Provider Number: [id number]  Attending Physician Name and Address:  Barb Merino, MD  Relative Name and Phone Number:       Current Level of Care: Hospital Recommended Level of Care: [place] Prior Approval Number:    Date Approved/Denied:   PASRR Number: [id number]  Discharge Plan: SNF    Current Diagnoses: Patient Active Problem List   Diagnosis Date Noted  . Acute metabolic encephalopathy [id number]  . CVA (cerebral vascular accident) ([place]) 12/01/2018  . TIA (transient ischemic attack) 11/30/2018  . Thrombocytopenia ([place]) 10/19/2017  . Atrial flutter ([place]) 10/17/2017  . Hyperaldosteronism ([place]) 10/17/2017  . Seizure ([place]) 10/15/2017  . Aspiration pneumonia of both lower lobes due to gastric secretions ([place]) 10/15/2017  . Acute hypoxemic respiratory failure ([place]) 10/15/2017  . Rectal bleeding   . Acute blood loss anemia   . Platelet inhibition due to Plavix   . Diverticulosis of colon with hemorrhage   . GI bleed 09/03/2017  . Dysphagia   . Protein-calorie malnutrition, severe ([place]) 04/09/2014  . Esophagus cancer ([place]) 04/07/2014  . Mechanical dysphagia 04/07/2014  . Esophageal cancer ([place]) 04/07/2014    Orientation RESPIRATION BLADDER Height & Weight     Self, Place  Normal Incontinent Weight: 178 lb 9.2 oz (81 kg) Height:  5\' 6"  (167.6 cm)  BEHAVIORAL SYMPTOMS/MOOD NEUROLOGICAL BOWEL NUTRITION STATUS      Incontinent Diet(see DC summary)  AMBULATORY STATUS COMMUNICATION OF NEEDS Skin   Limited Assist Verbally Normal                       Personal Care Assistance Level of Assistance  Bathing,  Feeding, Dressing Bathing Assistance: Limited assistance Feeding assistance: Independent Dressing Assistance: Limited assistance     Functional Limitations Info  Sight, Hearing, Speech Sight Info: Adequate Hearing Info: Adequate Speech Info: Adequate    SPECIAL CARE FACTORS FREQUENCY  PT (By licensed PT), OT (By licensed OT), Speech therapy     PT Frequency: 5x/wk OT Frequency: 5x/wk     Speech Therapy Frequency: 5x/wk      Contractures Contractures Info: Not present    Additional Factors Info  Code Status, Allergies, Psychotropic Code Status Info: Full Allergies Info: NKA Psychotropic Info: Prozac 10mg  daily         Current Medications (12/04/2018):  This is the current hospital active medication list Current Facility-Administered Medications  Medication Dose Route Frequency Provider Last Rate Last Dose  . acetaminophen (TYLENOL) tablet 650 mg  650 mg Oral Q4H PRN Toy Baker, MD       Or  . acetaminophen (TYLENOL) solution 650 mg  650 mg Per Tube Q4H PRN Doutova, Anastassia, MD       Or  . acetaminophen (TYLENOL) suppository 650 mg  650 mg Rectal Q4H PRN Doutova, Anastassia, MD      . apixaban (ELIQUIS) tablet 5 mg  5 mg Oral BID Rosalin Hawking, MD   5 mg at 12/04/18 1005  . aspirin EC tablet 81 mg  81 mg Oral Daily Rosalin Hawking, MD   81 mg at 12/04/18 1005  . atorvastatin (LIPITOR) tablet 80 mg  80 mg Oral  [id number] Barb Merino, MD   80 mg at 12/03/18 1743  . feeding supplement (ENSURE ENLIVE) (ENSURE ENLIVE) liquid 237 mL  237 mL Oral BID BM Barb Merino, MD   237 mL at 12/04/18 1310  . FLUoxetine (PROZAC) capsule 10 mg  10 mg Oral Daily Doutova, Anastassia, MD   10 mg at 12/04/18 1006  . levETIRAcetam (KEPPRA) tablet 500 mg  500 mg Oral BID Toy Baker, MD   500 mg at 12/04/18 1005  . multivitamin with minerals tablet 1 tablet  1 tablet Oral Daily Barb Merino, MD   1 tablet at 12/04/18 1005  . pantoprazole (PROTONIX) EC tablet 40 mg  40 mg Oral  Daily Doutova, Anastassia, MD   40 mg at 12/04/18 1005  . senna-docusate (Senokot-S) tablet 1 tablet  1 tablet Oral QHS PRN Doutova, Anastassia, MD      . spironolactone (ALDACTONE) tablet 50 mg  50 mg Oral Daily Doutova, Anastassia, MD   50 mg at 12/04/18 1005  . tuberculin injection 5 Units  5 Units Intradermal Once Barb Merino, MD   5 Units at 12/02/18 1744     Discharge Medications: Please see discharge summary for a list of discharge medications.  Relevant Imaging Results:  Relevant Lab Results:   Additional Information SS#: [id number]  Geralynn Ochs, LCSW

## 2018-12-05 DIAGNOSIS — I484 Atypical atrial flutter: Secondary | ICD-10-CM

## 2018-12-05 LAB — MRSA PCR SCREENING: MRSA by PCR: NEGATIVE

## 2018-12-05 MED ORDER — METOPROLOL SUCCINATE ER 25 MG PO TB24
50.0000 mg | ORAL_TABLET | Freq: Every day | ORAL | Status: DC
  Administered 2018-12-05 – 2018-12-09 (×5): 50 mg via ORAL
  Filled 2018-12-05 (×5): qty 2

## 2018-12-05 NOTE — Progress Notes (Addendum)
PROGRESS NOTE    Sheila Young  [id number]  DOB: 1950/10/08  DOA: 11/30/2018 PCP: Beckie Salts, MD  Brief Narrative:  68 year old female with history of stroke, cognitive dysfunction, esophageal carcinoma with dysphagia, hyperaldosteronism and chronic anemia, chronic kidney disease stage III, history of seizures, recently diagnosed A. Fib for which she is on Eliquis presented to the hospital on sister's request who noticed cognitive decline and patient as she was slow to respond and possibly not taking medications for 4 days prior to admission..  Patient does have history of seizure and some baseline cognitive dysfunction.  It is not clear if patient was compliant with anticoagulation regimen.  Work-up in the ED revealed normal CT head but MRI showedmultiple areas of ischemia likely embolic source.  MRA of the head and neck shows complete right ICA blockage.  Subjective:  Patient sitting in bedside chair, appears to be in good spirits.  Has dinner on the table but states she is not hungry.  Able to move all extremities and follow commands.  Objective: Vitals:   12/05/18 0414 12/05/18 0706 12/05/18 1100 12/05/18 1500  BP: 137/74 132/68 133/73 119/61  Pulse: 65 63 68 73  Resp: 16 16 16 16   Temp: 98.8 F (37.1 C) 98.1 F (36.7 C) (!) 97.5 F (36.4 C) 98 F (36.7 C)  TempSrc:  Axillary Axillary Oral  SpO2: 94% 99% 94% 100%  Weight:      Height:        Intake/Output Summary (Last 24 hours) at 12/05/2018 1834 Last data filed at 12/05/2018 1100 Gross per 24 hour  Intake 200 ml  Output -  Net 200 ml   Filed Weights   11/30/18 2219  Weight: 81 kg    Physical Examination:  General exam: Appears calm and comfortable  Respiratory system: Clear to auscultation. Respiratory effort normal. Cardiovascular system: S1 & S2 heard, RRR. No JVD, murmurs, rubs, gallops or clicks. No pedal edema. Gastrointestinal system: Abdomen is nondistended, soft and nontender. No organomegaly or  masses felt. Normal bowel sounds heard. Central nervous system: Alert and oriented x2. No obvious focal neurological deficits. Extremities: Symmetric 5 x 5 power. Skin: No rashes, lesions or ulcers Psychiatry: Judgement and insight appear impaired. Mood & affect: Cheerful    Data Reviewed: I have personally reviewed following labs and imaging studies  CBC: Recent Labs  Lab 11/30/18 1149 12/02/18 0327  WBC 11.2* 13.7*  NEUTROABS 8.3* 10.0*  HGB 11.3* 12.5  HCT 36.3 39.5  MCV 93.3 91.9  PLT 243 [id number]   Basic Metabolic Panel: Recent Labs  Lab 11/30/18 1149 12/02/18 0327  NA 140 141  K 3.6 3.7  CL 108 107  CO2 22 22  GLUCOSE 99 102*  BUN 39* 26*  CREATININE 1.94* 1.40*  CALCIUM 9.7 9.7   GFR: Estimated Creatinine Clearance: 41.9 mL/min (A) (by C-G formula based on SCr of 1.4 mg/dL (H)). Liver Function Tests: Recent Labs  Lab 11/30/18 1149  AST 15  ALT 14  ALKPHOS 62  BILITOT 0.6  PROT 6.9  ALBUMIN 3.6   No results for input(s): LIPASE, AMYLASE in the last 168 hours. Recent Labs  Lab 11/30/18 1232  AMMONIA 18   Coagulation Profile: Recent Labs  Lab 11/30/18 1149  INR 1.3*   Cardiac Enzymes: No results for input(s): CKTOTAL, CKMB, CKMBINDEX, TROPONINI in the last 168 hours. BNP (last 3 results) No results for input(s): PROBNP in the last 8760 hours. HbA1C: No results for input(s): HGBA1C in the last 72 hours.  CBG: Recent Labs  Lab 11/30/18 1214  GLUCAP 82   Lipid Profile: No results for input(s): CHOL, HDL, LDLCALC, TRIG, CHOLHDL, LDLDIRECT in the last 72 hours. Thyroid Function Tests: No results for input(s): TSH, T4TOTAL, FREET4, T3FREE, THYROIDAB in the last 72 hours. Anemia Panel: No results for input(s): VITAMINB12, FOLATE, FERRITIN, TIBC, IRON, RETICCTPCT in the last 72 hours. Sepsis Labs: No results for input(s): PROCALCITON, LATICACIDVEN in the last 168 hours.  Recent Results (from the past 240 hour(s))  SARS CORONAVIRUS 2 (TAT 6-12  HRS) Nasal Swab Aptima Multi Swab     Status: None   Collection Time: 11/30/18  2:01 PM   Specimen: Aptima Multi Swab; Nasal Swab  Result Value Ref Range Status   SARS Coronavirus 2 NEGATIVE NEGATIVE Final    Comment: (NOTE) SARS-CoV-2 target nucleic acids are NOT DETECTED. The SARS-CoV-2 RNA is generally detectable in upper and lower respiratory specimens during the acute phase of infection. Negative results do not preclude SARS-CoV-2 infection, do not rule out co-infections with other pathogens, and should not be used as the sole basis for treatment or other patient management decisions. Negative results must be combined with clinical observations, patient history, and epidemiological information. The expected result is Negative. Fact Sheet for Patients: SugarRoll.be Fact Sheet for Healthcare Providers: https://www.woods-mathews.com/ This test is not yet approved or cleared by the [place] FDA and  has been authorized for detection and/or diagnosis of SARS-CoV-2 by FDA under an Emergency Use Authorization (EUA). This EUA will remain  in effect (meaning this test can be used) for the duration of the COVID-19 declaration under Section 56 4(b)(1) of the Act, 21 U.S.C. section 360bbb-3(b)(1), unless the authorization is terminated or revoked sooner. Performed at [facility], [place] [address], [place], [place] [address]   MRSA PCR Screening     Status: None   Collection Time: 12/05/18  7:55 AM   Specimen: Nasal Mucosa; Nasopharyngeal  Result Value Ref Range Status   MRSA by PCR NEGATIVE NEGATIVE Final    Comment:        The GeneXpert MRSA Assay (FDA approved for NASAL specimens only), is one component of a comprehensive MRSA colonization surveillance program. It is not intended to diagnose MRSA infection nor to guide or monitor treatment for MRSA infections. Performed at [facility], [place] [address], [place],  [place] [address]       Radiology Studies: No results found.      Scheduled Meds: . apixaban  5 mg Oral BID  . aspirin EC  81 mg Oral Daily  . atorvastatin  80 mg Oral q1800  . feeding supplement (ENSURE ENLIVE)  237 mL Oral BID BM  . FLUoxetine  10 mg Oral Daily  . levETIRAcetam  500 mg Oral BID  . metoprolol succinate  50 mg Oral Daily  . multivitamin with minerals  1 tablet Oral Daily  . pantoprazole  40 mg Oral Daily  . spironolactone  50 mg Oral Daily   Continuous Infusions:  Assessment & Plan:    1.  Acute embolic CVA:MRI of the brain, multiple areas of ischemia likely embolic source.  MRA of the head and neck shows complete right ICA blockage.  2D echocardiogram, normal.  No PFO.  She was likely noncompliant with medications at home.  Neurology recommended Eliquis along with aspirin 81 mg daily.LDL 121, on atorvastatin 40 mg at home, increased to 80 mg daily.  Hemoglobin A1c, 6.1 continue PT/OT/cognitive speech therapy.  Awaiting ALF  placement  2. Complete right carotid artery blockage: Continue statins, antiplatelet plus anticoagulation therapy.  Could have been source for problem #1  3. Paroxysmal A. Fib:  On metoprolol and Eliquis.  4. Acute kidney injury on chronic kidney disease stage [id number] creatinine about 1.2.  Improving levels from 1.9-1.6.  Repeat labs in a.m. encouraged oral fluid intake  5.  History of esophageal carcinoma and baseline dysphagia: Monitor oral intake.  Patient states she is not hungry and denies any dysphagia.  6. Seizure disorder: No evidence of new seizure.Keppra continued  7. Vascular dementia: Apparently has some baseline cognitive dysfunction and was at senior independent living apartment.  Now she will be going to ALF   DVT prophylaxis: On Eliquis Code Status: Full code Family / Patient Communication: Discussed with patient and nurse, no family bedside Disposition Plan: ALF pending, possibly Monday     LOS: 4 days    Time  spent: 25 minutes    Guilford Shi, MD [facility] Pager [phone]  If 7PM-7AM, please contact night-coverage [email or web address] Password [facility] 12/05/2018, 6:34 PM

## 2018-12-06 LAB — BASIC METABOLIC PANEL
Anion gap: 12 (ref 5–15)
BUN: 31 mg/dL — ABNORMAL HIGH (ref 8–23)
CO2: 25 mmol/L (ref 22–32)
Calcium: 10.3 mg/dL (ref 8.9–10.3)
Chloride: 102 mmol/L (ref 98–111)
Creatinine, Ser: 1.38 mg/dL — ABNORMAL HIGH (ref 0.44–1.00)
GFR calc Af Amer: 46 mL/min — ABNORMAL LOW (ref >60)
GFR calc non Af Amer: 39 mL/min — ABNORMAL LOW (ref >60)
Glucose, Bld: 101 mg/dL — ABNORMAL HIGH (ref 70–99)
Potassium: 3.9 mmol/L (ref 3.5–5.1)
Sodium: 139 mmol/L (ref 135–145)

## 2018-12-06 NOTE — Discharge Instructions (Signed)

## 2018-12-06 NOTE — Progress Notes (Addendum)
PROGRESS NOTE    Sheila Young  [id number]  DOB: 01/31/1951  DOA: 11/30/2018 PCP: Beckie Salts, MD  Brief Narrative:  68 year old female with history of stroke, cognitive dysfunction, esophageal carcinoma with dysphagia, hyperaldosteronism and chronic anemia, chronic kidney disease stage III, history of seizures, recently diagnosed A. Fib for which she is on Eliquis presented to the hospital on sister's request who noticed cognitive decline and patient as she was slow to respond and possibly not taking medications for 4 days prior to admission..  Patient does have history of seizure and some baseline cognitive dysfunction.  It is not clear if patient was compliant with anticoagulation regimen.  Work-up in the ED revealed normal CT head but MRI showedmultiple areas of ischemia likely embolic source.  MRA of the head and neck shows complete right ICA blockage.  Subjective:  Patient appears comfortable. No acute c/o. Eating better. She was able to provide the accurate month and date but required cues for the day and year.  Objective: Vitals:   12/05/18 1941 12/05/18 2324 12/06/18 0909 12/06/18 1119  BP: (!) 144/77 (!) 140/54 139/64 133/61  Pulse: 64 (!) 59 62 (!) 56  Resp: 18 18 20 18   Temp: 98 F (36.7 C) 98.4 F (36.9 C) 97.8 F (36.6 C) 98.1 F (36.7 C)  TempSrc: Oral Oral Oral Oral  SpO2: 99% 98% 99% 98%  Weight:      Height:        Intake/Output Summary (Last 24 hours) at 12/06/2018 1412 Last data filed at 12/06/2018 0911 Gross per 24 hour  Intake 600 ml  Output -  Net 600 ml   Filed Weights   11/30/18 2219  Weight: 81 kg    Physical Examination:  General exam: Appears calm and comfortable  Respiratory system: Clear to auscultation. Respiratory effort normal. Cardiovascular system: S1 & S2 heard, RRR. No JVD, murmurs, rubs, gallops or clicks. No pedal edema. Gastrointestinal system: Abdomen is nondistended, soft and nontender. No organomegaly or masses felt.  Normal bowel sounds heard. Central nervous system: Alert and oriented x2. No obvious focal neurological deficits. Extremities: Symmetric 5 x 5 power. Skin: No rashes, lesions or ulcers Psychiatry: Judgement and insight appear impaired. Mood & affect: Cheerful    Data Reviewed: I have personally reviewed following labs and imaging studies  CBC: Recent Labs  Lab 11/30/18 1149 12/02/18 0327  WBC 11.2* 13.7*  NEUTROABS 8.3* 10.0*  HGB 11.3* 12.5  HCT 36.3 39.5  MCV 93.3 91.9  PLT 243 [id number]   Basic Metabolic Panel: Recent Labs  Lab 11/30/18 1149 12/02/18 0327 12/06/18 0443  NA 140 141 139  K 3.6 3.7 3.9  CL 108 107 102  CO2 22 22 25   GLUCOSE 99 102* 101*  BUN 39* 26* 31*  CREATININE 1.94* 1.40* 1.38*  CALCIUM 9.7 9.7 10.3   GFR: Estimated Creatinine Clearance: 42.5 mL/min (A) (by C-G formula based on SCr of 1.38 mg/dL (H)). Liver Function Tests: Recent Labs  Lab 11/30/18 1149  AST 15  ALT 14  ALKPHOS 62  BILITOT 0.6  PROT 6.9  ALBUMIN 3.6   No results for input(s): LIPASE, AMYLASE in the last 168 hours. Recent Labs  Lab 11/30/18 1232  AMMONIA 18   Coagulation Profile: Recent Labs  Lab 11/30/18 1149  INR 1.3*   Cardiac Enzymes: No results for input(s): CKTOTAL, CKMB, CKMBINDEX, TROPONINI in the last 168 hours. BNP (last 3 results) No results for input(s): PROBNP in the last 8760 hours. HbA1C: No results for  input(s): HGBA1C in the last 72 hours. CBG: Recent Labs  Lab 11/30/18 1214  GLUCAP 82   Lipid Profile: No results for input(s): CHOL, HDL, LDLCALC, TRIG, CHOLHDL, LDLDIRECT in the last 72 hours. Thyroid Function Tests: No results for input(s): TSH, T4TOTAL, FREET4, T3FREE, THYROIDAB in the last 72 hours. Anemia Panel: No results for input(s): VITAMINB12, FOLATE, FERRITIN, TIBC, IRON, RETICCTPCT in the last 72 hours. Sepsis Labs: No results for input(s): PROCALCITON, LATICACIDVEN in the last 168 hours.  Recent Results (from the past 240  hour(s))  SARS CORONAVIRUS 2 (TAT 6-12 HRS) Nasal Swab Aptima Multi Swab     Status: None   Collection Time: 11/30/18  2:01 PM   Specimen: Aptima Multi Swab; Nasal Swab  Result Value Ref Range Status   SARS Coronavirus 2 NEGATIVE NEGATIVE Final    Comment: (NOTE) SARS-CoV-2 target nucleic acids are NOT DETECTED. The SARS-CoV-2 RNA is generally detectable in upper and lower respiratory specimens during the acute phase of infection. Negative results do not preclude SARS-CoV-2 infection, do not rule out co-infections with other pathogens, and should not be used as the sole basis for treatment or other patient management decisions. Negative results must be combined with clinical observations, patient history, and epidemiological information. The expected result is Negative. Fact Sheet for Patients: SugarRoll.be Fact Sheet for Healthcare Providers: https://www.woods-mathews.com/ This test is not yet approved or cleared by the [place] FDA and  has been authorized for detection and/or diagnosis of SARS-CoV-2 by FDA under an Emergency Use Authorization (EUA). This EUA will remain  in effect (meaning this test can be used) for the duration of the COVID-19 declaration under Section 56 4(b)(1) of the Act, 21 U.S.C. section 360bbb-3(b)(1), unless the authorization is terminated or revoked sooner. Performed at [facility], [place] [address] [place], [place] [address]   MRSA PCR Screening     Status: None   Collection Time: 12/05/18  7:55 AM   Specimen: Nasal Mucosa; Nasopharyngeal  Result Value Ref Range Status   MRSA by PCR NEGATIVE NEGATIVE Final    Comment:        The GeneXpert MRSA Assay (FDA approved for NASAL specimens only), is one component of a comprehensive MRSA colonization surveillance program. It is not intended to diagnose MRSA infection nor to guide or monitor treatment for MRSA infections. Performed at [facility], [place] [address] [place], [place] [address]       Radiology Studies: No results found.      Scheduled Meds: . apixaban  5 mg Oral BID  . aspirin EC  81 mg Oral Daily  . atorvastatin  80 mg Oral q1800  . feeding supplement (ENSURE ENLIVE)  237 mL Oral BID BM  . FLUoxetine  10 mg Oral Daily  . levETIRAcetam  500 mg Oral BID  . metoprolol succinate  50 mg Oral Daily  . multivitamin with minerals  1 tablet Oral Daily  . pantoprazole  40 mg Oral Daily  . spironolactone  50 mg Oral Daily   Continuous Infusions:  Assessment & Plan:    1.  Acute embolic CVA:MRI of the brain, multiple areas of ischemia likely embolic source.  MRA of the head and neck shows complete right ICA blockage.  2D echocardiogram, normal.  No PFO.  She was likely noncompliant with medications at home.  Neurology recommended Eliquis along with aspirin 81 mg daily.LDL 121, on atorvastatin 40 mg at home, increased to 80 mg daily.  Hemoglobin A1c, 6.1  continue PT/OT/cognitive speech therapy.  Awaiting ALF placement  2. Complete right carotid artery blockage: Continue statins, antiplatelet plus anticoagulation therapy.  Could have been source for problem #1  3. Paroxysmal A. Fib:  On metoprolol and Eliquis.  4. Acute kidney injury on chronic kidney disease stage [id number] creatinine about 1.2.  Improving levels from 1.9-1.6.  Repeat labs in a.m. encouraged oral fluid intake  5.  History of esophageal carcinoma and baseline dysphagia:Esophagram performed 06/25/17 showing slightly irregular stricture of the distal esophagus measuring up to 8 mm in maximal diameter, spanning a length of 3.6 cm, likely secondary to prior radiation. A 13 mm barium tablet would not pass through the stricture. 10/15/2017 MBS showed oral and pharyngeal swallow fx WNL.CXR 11/30/18 Stable with no concern for aspiration.  Monitor oral intake.  Patient denies any dysphagia now.Seen by speech and recommended outpatient f/u for cognition/  speech/swallow  6. Seizure disorder: No evidence of new seizure.Keppra continued  7. Vascular dementia: Apparently has some baseline cognitive dysfunction and was at senior independent living apartment.  Now she will be going to ALF   DVT prophylaxis: On Eliquis Code Status: Full code Family / Patient Communication: Discussed with patient, no family bedside Disposition Plan: medically clear for discharge. ALF pending, possibly in am     LOS: 5 days    Time spent: 25 minutes    Guilford Shi, MD [facility] Pager [phone]  If 7PM-7AM, please contact night-coverage [email or web address] Password [email or web address] 12/06/2018, 2:12 PM

## 2018-12-06 NOTE — Progress Notes (Signed)
PT Cancellation Note  Patient Details Name: Sheila Young MRN: [id number] DOB: [birth year]   Cancelled Treatment:    Reason Eval/Treat Not Completed: Patient declined, no reason specified. Pt in bed on arrival. Declining participation in therapy, stating "I walk a lot yesterday and I just don't want to." Encouragement provided but pt continued to shake her head no. PT to re-attempt tomorrow. Per OT and CM notes, discharge plan is now SNF. PT to update recommendations as indicated next session.   Lorriane Shire 12/06/2018, 2:43 PM  Lorrin Goodell, PT  Office # [phone] Pager [phone]

## 2018-12-07 NOTE — Progress Notes (Signed)
Physical Therapy Treatment Patient Details Name: Sheila Young MRN: [id number] DOB: 11-Apr-1950 Today's Date: 12/07/2018    History of Present Illness 68 y.o. female admitted on 11/30/18 for confusion and L sided weakness.  Neuro consulted for concern of TIA/stroke.  Workup is still pending.  MRI revealed multifocal acute ischemia R hemispheere, single punctate focus of sicehmia in L hemisphere and abnormal R ICA flow.  Pt with significant PMH of stroke, seizure, radiation, HTN, and esophagus CA.    PT Comments    Patient seen for mobility progression. Pt continues to present with cognitive deficits and impaired balance increasing risk for falls.  Pt requires min guard assist for safe OOB mobility. Pt will need 24 hour assistance upon d/c. If 24 hour assist is not available recommend post acute rehab for further skilled PT services to maximize independence and safety with mobility.    Follow Up Recommendations  Home health PT;Supervision/Assistance - 24 hour (SNF if family is unable to provide 24 hour assist)     Equipment Recommendations  None recommended by PT    Recommendations for Other Services       Precautions / Restrictions Precautions Precautions: Fall Restrictions Weight Bearing Restrictions: No    Mobility  Bed Mobility               General bed mobility comments: pt OOB in chair upon arrival  Transfers Overall transfer level: Needs assistance Equipment used: None Transfers: Sit to/from Stand Sit to Stand: Min guard         General transfer comment: min guard for safety; increased time to gain balance upon standing; no physical assist needed  Ambulation/Gait Ambulation/Gait assistance: Min guard Gait Distance (Feet): 200 Feet Assistive device: None Gait Pattern/deviations: Step-through pattern;Decreased stride length Gait velocity: decreased   General Gait Details: pt with drifting R and L and X 2-3 running into objects in hallway on L side; pt with  unsteadiness but no LOB; pt is unable to continue ambulating while attempting to answer questions    Stairs             Wheelchair Mobility    Modified Rankin (Stroke Patients Only) Modified Rankin (Stroke Patients Only) Pre-Morbid Rankin Score: Moderate disability Modified Rankin: Moderately severe disability     Balance Overall balance assessment: Needs assistance Sitting-balance support: Feet supported;No upper extremity supported Sitting balance-Leahy Scale: Good     Standing balance support: No upper extremity supported Standing balance-Leahy Scale: Fair                              Cognition Arousal/Alertness: Awake/alert Behavior During Therapy: WFL for tasks assessed/performed Overall Cognitive Status: No family/caregiver present to determine baseline cognitive functioning Area of Impairment: Orientation;Memory;Safety/judgement;Problem solving                 Orientation Level: Disoriented to;Place;Situation(able to state month and year and in the hospital)   Memory: Decreased short-term memory   Safety/Judgement: Decreased awareness of safety   Problem Solving: Slow processing;Difficulty sequencing;Requires verbal cues General Comments: at times pt running into objects in hallway on L side; pt attempting to scoot to end of recliner to stand with recliner still elevated      Exercises      General Comments        Pertinent Vitals/Pain      Home Living  Prior Function            PT Goals (current goals can now be found in the care plan section) Acute Rehab PT Goals Patient Stated Goal: to go home Progress towards PT goals: Progressing toward goals    Frequency    Min 4X/week      PT Plan Current plan remains appropriate    Co-evaluation              AM-PAC PT "6 Clicks" Mobility   Outcome Measure  Help needed turning from your back to your side while in a flat bed without  using bedrails?: None Help needed moving from lying on your back to sitting on the side of a flat bed without using bedrails?: A Little Help needed moving to and from a bed to a chair (including a wheelchair)?: A Little Help needed standing up from a chair using your arms (e.g., wheelchair or bedside chair)?: A Little Help needed to walk in hospital room?: A Little Help needed climbing 3-5 steps with a railing? : A Little 6 Click Score: 19    End of Session Equipment Utilized During Treatment: Gait belt Activity Tolerance: Patient tolerated treatment well Patient left: in chair;with call bell/phone within reach;with chair alarm set Nurse Communication: Mobility status PT Visit Diagnosis: Difficulty in walking, not elsewhere classified (R26.2);Other symptoms and signs involving the nervous system (R29.898)     Time: [id number] PT Time Calculation (min) (ACUTE ONLY): 22 min  Charges:  $Gait Training: 8-22 mins                     Earney Navy, PTA Acute Rehabilitation Services Pager: [phone] Office: [phone]     Darliss Cheney 12/07/2018, 3:45 PM

## 2018-12-07 NOTE — Progress Notes (Signed)
PROGRESS NOTE    Sheila Young  [id number]  DOB: [birth year]  DOA: 11/30/2018 PCP: Beckie Salts, MD  Brief Narrative:  68 year old female with history of stroke, cognitive dysfunction, esophageal carcinoma with dysphagia, hyperaldosteronism and chronic anemia, chronic kidney disease stage III, history of seizures, recently diagnosed A. Fib for which she is on Eliquis presented to the hospital on sister's request who noticed cognitive decline and patient as she was slow to respond and possibly not taking medications for 4 days prior to admission..  Patient does have history of seizure and some baseline cognitive dysfunction.  It is not clear if patient was compliant with anticoagulation regimen.  Work-up in the ED revealed normal CT head but MRI showedmultiple areas of ischemia likely embolic source.  MRA of the head and neck shows complete right ICA blockage.  Subjective:  No acute c/o overnight. Eating well now. Denies dysphagia.More alert and oriented now  Objective: Vitals:   12/07/18 0400 12/07/18 0845 12/07/18 1203 12/07/18 1705  BP: (!) 162/74 (!) 146/67 (!) 144/71 (!) 133/54  Pulse: 64 66 (!) 51 (!) 52  Resp: 18 16 16 16   Temp: 97.9 F (36.6 C) 98 F (36.7 C) 97.9 F (36.6 C) 98.6 F (37 C)  TempSrc: Oral Oral Oral Oral  SpO2: 98% 100% 100% 98%  Weight:      Height:        Intake/Output Summary (Last 24 hours) at 12/07/2018 1738 Last data filed at 12/07/2018 1435 Gross per 24 hour  Intake 830 ml  Output -  Net 830 ml   Filed Weights   11/30/18 2219  Weight: 81 kg    Physical Examination:  General exam: Appears calm and comfortable  Respiratory system: Clear to auscultation. Respiratory effort normal. Cardiovascular system: S1 & S2 heard, RRR. No JVD, murmurs, rubs, gallops or clicks. No pedal edema. Gastrointestinal system: Abdomen is nondistended, soft and nontender. No organomegaly or masses felt. Normal bowel sounds heard. Central nervous system: Alert  and oriented x2. No obvious focal neurological deficits. Extremities: Symmetric 5 x 5 power. Skin: No rashes, lesions or ulcers Psychiatry: Judgement and insight appear impaired. Mood & affect: Cheerful    Data Reviewed: I have personally reviewed following labs and imaging studies  CBC: Recent Labs  Lab 12/02/18 0327  WBC 13.7*  NEUTROABS 10.0*  HGB 12.5  HCT 39.5  MCV 91.9  PLT [id number]   Basic Metabolic Panel: Recent Labs  Lab 12/02/18 0327 12/06/18 0443  NA 141 139  K 3.7 3.9  CL 107 102  CO2 22 25  GLUCOSE 102* 101*  BUN 26* 31*  CREATININE 1.40* 1.38*  CALCIUM 9.7 10.3   GFR: Estimated Creatinine Clearance: 42.5 mL/min (A) (by C-G formula based on SCr of 1.38 mg/dL (H)). Liver Function Tests: No results for input(s): AST, ALT, ALKPHOS, BILITOT, PROT, ALBUMIN in the last 168 hours. No results for input(s): LIPASE, AMYLASE in the last 168 hours. No results for input(s): AMMONIA in the last 168 hours. Coagulation Profile: No results for input(s): INR, PROTIME in the last 168 hours. Cardiac Enzymes: No results for input(s): CKTOTAL, CKMB, CKMBINDEX, TROPONINI in the last 168 hours. BNP (last 3 results) No results for input(s): PROBNP in the last 8760 hours. HbA1C: No results for input(s): HGBA1C in the last 72 hours. CBG: No results for input(s): GLUCAP in the last 168 hours. Lipid Profile: No results for input(s): CHOL, HDL, LDLCALC, TRIG, CHOLHDL, LDLDIRECT in the last 72 hours. Thyroid Function Tests: No results for  input(s): TSH, T4TOTAL, FREET4, T3FREE, THYROIDAB in the last 72 hours. Anemia Panel: No results for input(s): VITAMINB12, FOLATE, FERRITIN, TIBC, IRON, RETICCTPCT in the last 72 hours. Sepsis Labs: No results for input(s): PROCALCITON, LATICACIDVEN in the last 168 hours.  Recent Results (from the past 240 hour(s))  SARS CORONAVIRUS 2 (TAT 6-12 HRS) Nasal Swab Aptima Multi Swab     Status: None   Collection Time: 11/30/18  2:01 PM   Specimen:  Aptima Multi Swab; Nasal Swab  Result Value Ref Range Status   SARS Coronavirus 2 NEGATIVE NEGATIVE Final    Comment: (NOTE) SARS-CoV-2 target nucleic acids are NOT DETECTED. The SARS-CoV-2 RNA is generally detectable in upper and lower respiratory specimens during the acute phase of infection. Negative results do not preclude SARS-CoV-2 infection, do not rule out co-infections with other pathogens, and should not be used as the sole basis for treatment or other patient management decisions. Negative results must be combined with clinical observations, patient history, and epidemiological information. The expected result is Negative. Fact Sheet for Patients: SugarRoll.be Fact Sheet for Healthcare Providers: https://www.woods-mathews.com/ This test is not yet approved or cleared by the [place] FDA and  has been authorized for detection and/or diagnosis of SARS-CoV-2 by FDA under an Emergency Use Authorization (EUA). This EUA will remain  in effect (meaning this test can be used) for the duration of the COVID-19 declaration under Section 56 4(b)(1) of the Act, 21 U.S.C. section 360bbb-3(b)(1), unless the authorization is terminated or revoked sooner. Performed at [facility], [place] [address] [place], [place] [address]   MRSA PCR Screening     Status: None   Collection Time: 12/05/18  7:55 AM   Specimen: Nasal Mucosa; Nasopharyngeal  Result Value Ref Range Status   MRSA by PCR NEGATIVE NEGATIVE Final    Comment:        The GeneXpert MRSA Assay (FDA approved for NASAL specimens only), is one component of a comprehensive MRSA colonization surveillance program. It is not intended to diagnose MRSA infection nor to guide or monitor treatment for MRSA infections. Performed at [facility], [place] [address], [place], [place] [address]       Radiology Studies: No results found.      Scheduled Meds: . apixaban  5 mg  Oral BID  . aspirin EC  81 mg Oral Daily  . atorvastatin  80 mg Oral q1800  . feeding supplement (ENSURE ENLIVE)  237 mL Oral BID BM  . FLUoxetine  10 mg Oral Daily  . levETIRAcetam  500 mg Oral BID  . metoprolol succinate  50 mg Oral Daily  . multivitamin with minerals  1 tablet Oral Daily  . pantoprazole  40 mg Oral Daily  . spironolactone  50 mg Oral Daily   Continuous Infusions:  Assessment & Plan:    1.  Acute embolic CVA:MRI of the brain, multiple areas of ischemia likely embolic source.  MRA of the head and neck shows complete right ICA blockage.  2D echocardiogram, normal.  No PFO.  She was likely noncompliant with medications at home.  Neurology recommended Eliquis along with aspirin 81 mg daily.LDL 121, on atorvastatin 40 mg at home, increased to 80 mg daily.  Hemoglobin A1c, 6.1 continue PT/OT/cognitive speech therapy.  Awaiting SNF placement now  2. Complete right carotid artery blockage: Continue statins, antiplatelet plus anticoagulation therapy.  Could have been source for problem #1  3. Paroxysmal A. Fib:  On metoprolol and Eliquis.  4.  Acute kidney injury on chronic kidney disease stage [id number] creatinine about 1.2.  Improving levels from 1.9-1.6.  Repeat labs in a.m. encouraged oral fluid intake  5.  History of esophageal carcinoma and baseline dysphagia:Esophagram performed 06/25/17 showing slightly irregular stricture of the distal esophagus measuring up to 8 mm in maximal diameter, spanning a length of 3.6 cm, likely secondary to prior radiation. A 13 mm barium tablet would not pass through the stricture. 10/15/2017 MBS showed oral and pharyngeal swallow fx WNL.CXR 11/30/18 Stable with no concern for aspiration.  Monitor oral intake.  Patient denies any dysphagia now.Seen by speech and recommended outpatient f/u for cognition/ speech/swallow  6. Seizure disorder: No evidence of new seizure.Keppra continued  7. Vascular dementia: Apparently has some baseline  cognitive dysfunction and was at senior independent living apartment.  Now she will be going to SNF   DVT prophylaxis: On Eliquis Code Status: Full code Family / Patient Communication: Discussed with patient, no family bedside Disposition Plan: medically clear for discharge. SNF pending, possibly in am     LOS: 6 days    Time spent: 25 minutes    Guilford Shi, MD [facility] Pager [phone]  If 7PM-7AM, please contact night-coverage [email or web address] Password [facility] 12/07/2018, 5:38 PM

## 2018-12-08 LAB — SARS CORONAVIRUS 2 (TAT 6-24 HRS): SARS Coronavirus 2: NEGATIVE

## 2018-12-08 MED ORDER — ASPIRIN 81 MG PO TBEC: 81.0000 mg | DELAYED_RELEASE_TABLET | Freq: Every day | ORAL | Status: DC

## 2018-12-08 MED ORDER — ATORVASTATIN CALCIUM 80 MG PO TABS: 80.0000 mg | ORAL_TABLET | Freq: Every day | ORAL | Status: DC

## 2018-12-08 NOTE — Discharge Summary (Addendum)
Physician Discharge Summary  Sheila Young [id number] DOB: 1950/10/01 DOA: 11/30/2018  PCP: Beckie Salts, MD  Admit date: 11/30/2018 Discharge date: 12/08/2018  [facility]: [facility] Disposition: SNF  Recommendations for Outpatient Follow-up:  1. Follow up with PCP in 1-2 weeks   Home Health: Not applicable Equipment/Devices: Not applicable  Discharge Condition: Stable CODE STATUS: Full code Diet recommendation: Low-salt diet  Brief/Interim Summary: 68 year old female with history of stroke, cognitive dysfunction, esophageal carcinoma with dysphagia, hyperaldosteronism and chronic anemia, chronic kidney disease stage III, history of seizures, recently diagnosed A. Fib for which she is on Eliquis presented to the hospital on sister's request who noticed cognitive decline and patient as she was slow to respond and possibly not taking medications for 4 days prior to admission.. Patient does have history of seizure and some baseline cognitive dysfunction.  It is not clear if patient was compliant with anticoagulation regimen.  Work-up in the ED revealed normal CT head but MRI showedmultiple areas of ischemia likely embolic source. MRA of the head and neck shows complete right ICA blockage.  Discharge Diagnoses:  Active Problems:   Esophagus cancer (HCC)   Mechanical dysphagia   Protein-calorie malnutrition, severe (HCC)   Seizure ([place])   Atrial flutter (HCC)   Hyperaldosteronism (HCC)   Acute metabolic encephalopathy   CVA (cerebral vascular accident) ([place])  1.  Acute embolic CVA:MRI of the brain, multiple areas of ischemia likely embolic source. MRA of the head and neck shows complete right ICA blockage. 2D echocardiogram, normal. No PFO.  She was likely noncompliant with medications at home.  Neurologyrecommended Eliquis along with aspirin 81 mg daily.LDL 121, on atorvastatin 40 mg at home, increased to 80 mg daily. Hemoglobin A1c, 6.1 continue PT/OT/cognitive speech therapy.    2. Complete right carotid artery blockage: Continue statins, antiplatelet plus anticoagulation therapy.  Could have been source for problem #1  3. Paroxysmal A. Fib: On metoprolol and Eliquis.  4. Acute kidney injury on chronic kidney disease stage [id number] creatinine about 1.2. Improving levels from 1.9-1.6-1.3. encouraged oral fluid intake  5.  History of esophageal carcinoma and baseline dysphagia:Esophagram performed 06/25/17 showing slightly irregular stricture of the distal esophagus measuring up to 8 mm in maximal diameter, spanning a length of 3.6 cm, likely secondary to prior radiation. A 13 mm barium tablet would not pass through the stricture. 10/15/2017 MBS showed oral and pharyngeal swallow fx WNL.CXR 11/30/18 Stable with no concern for aspiration.  Monitor oral intake.  Patient denies any dysphagia now.Seen by speech and recommended outpatient f/u for cognition/ speech/swallow  6. Seizure disorder: No evidence of new seizure.Keppra continued  7. Vascular dementia: Apparently has some baseline cognitive dysfunction and was at senior independent living apartment.   Medically stable to transfer to skilled level of care.   Discharge Instructions  Discharge Instructions    Ambulatory referral to Neurology   Complete by: As directed    Follow up with stroke clinic NP (Jessica Vanschaick or Cecille Rubin, if both not available, consider Zachery Dauer, or [place]) at [facility] in about 4 weeks. Thanks.   Diet - low sodium heart healthy   Complete by: As directed    Increase activity slowly   Complete by: As directed      Allergies as of 12/08/2018   No Known Allergies     Medication List    STOP taking these medications   losartan 50 MG tablet Commonly known as: COZAAR     TAKE these medications   amLODipine 10  MG tablet Commonly known as: NORVASC Take 0.5 tablets (5 mg total) by mouth daily. What changed: how much to take   apixaban 5 MG Tabs tablet Commonly  known as: ELIQUIS Take 1 tablet (5 mg total) by mouth 2 (two) times daily.   aspirin 81 MG EC tablet Take 1 tablet (81 mg total) by mouth daily. Start taking on: December 09, 2018   atorvastatin 80 MG tablet Commonly known as: LIPITOR Take 1 tablet (80 mg total) by mouth at bedtime. What changed:   medication strength  how much to take   FLUoxetine 10 MG capsule Commonly known as: PROZAC Take 1 capsule (10 mg total) by mouth daily.   levETIRAcetam 500 MG tablet Commonly known as: KEPPRA Take 500 mg by mouth 2 (two) times daily.   magnesium oxide 400 (241.3 Mg) MG tablet Commonly known as: MAG-OX Take 400 mg by mouth 2 (two) times daily.   metoprolol succinate 25 MG 24 hr tablet Commonly known as: TOPROL-XL Take 3 tablets (75 mg total) by mouth daily. Take with or immediately following a meal. What changed: how much to take   pantoprazole 40 MG tablet Commonly known as: PROTONIX Take 1 tablet (40 mg total) by mouth daily.   spironolactone 50 MG tablet Commonly known as: ALDACTONE Take 1 tablet (50 mg total) by mouth daily.   Vitamin D-3 25 MCG (1000 UT) Caps Take 2,000 Units by mouth daily.      Follow-up Information    [facility]. Schedule an appointment as soon as possible for a visit in 4 week(s).   Specialty: Neurology Contact information: [address] [place] [phone]         No Known Allergies  Consultations:  Neurology   Procedures/Studies: Ct Head Wo Contrast  Result Date: 11/30/2018 CLINICAL DATA:  Onset left-sided weakness yesterday. EXAM: CT HEAD WITHOUT CONTRAST TECHNIQUE: Contiguous axial images were obtained from the base of the skull through the vertex without intravenous contrast. COMPARISON:  Head CT scan 07/21/2018.  Brain MRI 05/08/2018. FINDINGS: Brain: No evidence of acute infarction, hemorrhage, hydrocephalus, extra-axial collection or mass lesion/mass effect. Remote  left occipital lobe infarct again seen. Scattered lacunar infarctions including involvement of the basal ganglia, thalami and right corona radiata also again seen. Vascular: No hyperdense vessel or unexpected calcification. Skull: Intact.  No focal lesion. Sinuses/Orbits: Negative. Other: None. IMPRESSION: No acute abnormality. Multiple remote infarcts, unchanged. Electronically Signed   By: Inge Rise M.D.   On: 11/30/2018 12:29   Mr Angio Head Wo Contrast  Result Date: 12/01/2018 CLINICAL DATA:  Confusion.  Stroke, follow-up. EXAM: MRA NECK WITHOUT CONTRAST MRA HEAD WITHOUT CONTRAST TECHNIQUE: Multiplanar and multiecho pulse sequences of the neck were obtained without intravenous contrast. Angiographic images of the neck were obtained using MRA technique without intravenous contast.; Angiographic images of the Circle of Willis were obtained using MRA technique without intravenous contrast. COMPARISON:  None. FINDINGS: MRA NECK FINDINGS Time-of-flight images demonstrate at fluid disruption of the proximal right internal carotid artery at the bifurcation. There is no distal reconstituted flow visible by time-of-flight imaging. The left common carotid artery and bifurcation is normal. Vertebral arteries originate from the subclavian arteries. There is antegrade flow in both vertebral arteries. No significant flow disturbance is present. MRA HEAD FINDINGS Time-of-flight images demonstrate no significant flow within the right internal carotid artery from the skull base through the ICA terminus. There is some irregularity in the left cavernous internal carotid artery without  a significant stenosis relative to the more distal vessel. A1 and M1 segments demonstrate flow bilaterally. ACA vessels are normal bilaterally. There is some irregularity of MCA branch vessels. This may be artifactual. No significant proximal stenosis or occlusion is present. The vertebral arteries are hypoplastic bilaterally. The left  vertebral artery terminates at the PICA. There is decreased flow signal within the basilar artery. PCA branch vessels are not well seen. Prominent left posterior communicating artery is present. 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 IMPRESSION: 1. No flow visible in the right internal carotid artery from the bifurcation to the ICA terminus. 2. Normal flow in the left carotid artery through the ICA terminus. 3. Poor visualization of the posterior circulation on the MRA circle-of-Willis. The vessels appear hypoplastic. 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 4. 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 Electronically Signed   By: San Morelle M.D.   On: 12/01/2018 12:21   Mr Angio Neck Wo Contrast  Result Date: 12/01/2018 CLINICAL DATA:  Confusion.  Stroke, follow-up. EXAM: MRA NECK WITHOUT CONTRAST MRA HEAD WITHOUT CONTRAST TECHNIQUE: Multiplanar and multiecho pulse sequences of the neck were obtained without intravenous contrast. Angiographic images of the neck were obtained using MRA technique without intravenous contast.; Angiographic images of the Circle of Willis were obtained using MRA technique without intravenous contrast. COMPARISON:  None. FINDINGS: MRA NECK FINDINGS Time-of-flight images demonstrate at fluid disruption of the proximal right internal carotid artery at the bifurcation. There is no distal reconstituted flow visible by time-of-flight imaging. The left common carotid artery and bifurcation is normal. Vertebral arteries originate from the subclavian arteries. There is antegrade flow in both vertebral arteries. No significant flow disturbance is present. MRA HEAD FINDINGS Time-of-flight images demonstrate no significant flow within the right internal carotid artery from the skull base through the ICA terminus. There is some irregularity in the left cavernous internal carotid artery without a significant stenosis relative to the more distal vessel. A1 and M1 segments  demonstrate flow bilaterally. ACA vessels are normal bilaterally. There is some irregularity of MCA branch vessels. This may be artifactual. No significant proximal stenosis or occlusion is present. The vertebral arteries are hypoplastic bilaterally. The left vertebral artery terminates at the PICA. There is decreased flow signal within the basilar artery. PCA branch vessels are not well seen. Prominent left posterior communicating artery is present. 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 IMPRESSION: 1. No flow visible in the right internal carotid artery from the bifurcation to the ICA terminus. 2. Normal flow in the left carotid artery through the ICA terminus. 3. Poor visualization of the posterior circulation on the MRA circle-of-Willis. The vessels appear hypoplastic. 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 4. 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 Electronically Signed   By: San Morelle M.D.   On: 12/01/2018 12:21   Mr Brain Wo Contrast  Result Date: 12/01/2018 CLINICAL DATA:  Focal neurologic deficit. History of esophageal cancer. EXAM: MRI HEAD WITHOUT CONTRAST TECHNIQUE: Multiplanar, multiecho pulse sequences of the brain and surrounding structures were obtained without intravenous contrast. COMPARISON:  Head CT 11/30/2018 and brain MRI 05/08/2018 FINDINGS: BRAIN: There are multiple small foci of abnormal diffusion restriction within the right hemisphere, within the MCA and PCA territories. There is a single punctate focus of diffusion restriction in the left hemisphere near the hand motor area. There is an old infarct of the left PCA territory with associated encephalomalacia. Multiple old small vessel infarcts on the right. Multifocal white matter hyperintensity, most commonly due to chronic  ischemic microangiopathy. The cerebral and cerebellar volume are age-appropriate. There is no hydrocephalus. The midline structures are normal. VASCULAR: Susceptibility-sensitive sequences  show no chronic microhemorrhage or superficial siderosis. Abnormal right ICA flow void at the skull base. SKULL AND UPPER CERVICAL SPINE: Calvarial bone marrow signal is normal. There is no skull base mass. The visualized upper cervical spine and soft tissues are normal. SINUSES/ORBITS: There are no fluid levels or advanced mucosal thickening. The mastoid air cells and middle ear cavities are free of fluid. The orbits are normal. IMPRESSION: 1. Multifocal acute ischemia, predominantly within the right hemisphere, within multiple vascular territories, possibly embolic. 2. Single punctate focus of ischemia within the left hemisphere along the left precentral gyrus. 3. Abnormal right ICA flow void at the skull base which may indicate slow flow or occlusion. CTA or MRA may be helpful for further characterization. 4. Multiple old infarcts and findings of chronic small vessel ischemia. Electronically Signed   By: Ulyses Jarred M.D.   On: 12/01/2018 01:48   Dg Chest Portable 1 View  Result Date: 11/30/2018 CLINICAL DATA:  68 year old with acute onset of LEFT body weakness yesterday. Today the patient was dragging her LEFT leg while walking. Personal history of stroke and seizures. EXAM: PORTABLE CHEST 1 VIEW COMPARISON:  07/21/2018 and earlier. FINDINGS: Cardiac silhouette mildly enlarged for AP portable technique, unchanged. Lungs clear. Bronchovascular markings normal. Pulmonary vascularity normal. No visible pleural effusions. No pneumothorax. IMPRESSION: Stable mild cardiomegaly. No acute cardiopulmonary disease. Electronically Signed   By: Evangeline Dakin M.D.   On: 11/30/2018 12:48      Subjective: Patient seen and examined.  No overnight events.  She remains without any events.  Patient   Discharge Exam: Vitals:   12/08/18 0801 12/08/18 1114  BP: (!) 142/62 (!) 144/59  Pulse: (!) 52 (!) 53  Resp: 20 18  Temp: 97.9 F (36.6 C) 98.8 F (37.1 C)  SpO2: 98% 99%   Vitals:   12/08/18 0400 12/08/18  0801 12/08/18 0801 12/08/18 1114  BP: 127/69 (!) 142/62 (!) 142/62 (!) 144/59  Pulse: (!) 52 (!) 52 (!) 52 (!) 53  Resp: 18 20 20 18   Temp: 97.8 F (36.6 C) 97.9 F (36.6 C) 97.9 F (36.6 C) 98.8 F (37.1 C)  TempSrc: Oral Oral Oral Oral  SpO2: 99% 98% 98% 99%  Weight:      Height:        General: Pt is alert, awake, not in acute distress.  She is alert and awake.  She has some cognitive dysfunction.  No focal neurological deficits. Cardiovascular: RRR, S1/S2 +, no rubs, no gallops Respiratory: CTA bilaterally, no wheezing, no rhonchi Abdominal: Soft, NT, ND, bowel sounds + Extremities: no edema, no cyanosis    The results of significant diagnostics from this hospitalization (including imaging, microbiology, ancillary and laboratory) are listed below for reference.     Microbiology: Recent Results (from the past 240 hour(s))  SARS CORONAVIRUS 2 (TAT 6-12 HRS) Nasal Swab Aptima Multi Swab     Status: None   Collection Time: 11/30/18  2:01 PM   Specimen: Aptima Multi Swab; Nasal Swab  Result Value Ref Range Status   SARS Coronavirus 2 NEGATIVE NEGATIVE Final    Comment: (NOTE) SARS-CoV-2 target nucleic acids are NOT DETECTED. The SARS-CoV-2 RNA is generally detectable in upper and lower respiratory specimens during the acute phase of infection. Negative results do not preclude SARS-CoV-2 infection, do not rule out co-infections with other pathogens, and should not be used  as the sole basis for treatment or other patient management decisions. Negative results must be combined with clinical observations, patient history, and epidemiological information. The expected result is Negative. Fact Sheet for Patients: SugarRoll.be Fact Sheet for Healthcare Providers: https://www.woods-mathews.com/ This test is not yet approved or cleared by the [place] FDA and  has been authorized for detection and/or diagnosis of SARS-CoV-2 by FDA  under an Emergency Use Authorization (EUA). This EUA will remain  in effect (meaning this test can be used) for the duration of the COVID-19 declaration under Section 56 4(b)(1) of the Act, 21 U.S.C. section 360bbb-3(b)(1), unless the authorization is terminated or revoked sooner. Performed at [facility], [place] [address], [place], [place] [address]   MRSA PCR Screening     Status: None   Collection Time: 12/05/18  7:55 AM   Specimen: Nasal Mucosa; Nasopharyngeal  Result Value Ref Range Status   MRSA by PCR NEGATIVE NEGATIVE Final    Comment:        The GeneXpert MRSA Assay (FDA approved for NASAL specimens only), is one component of a comprehensive MRSA colonization surveillance program. It is not intended to diagnose MRSA infection nor to guide or monitor treatment for MRSA infections. Performed at [facility], [place] [address], [place], [place] [address]      Labs: BNP (last 3 results) No results for input(s): BNP in the last 8760 hours. Basic Metabolic Panel: Recent Labs  Lab 12/02/18 0327 12/06/18 0443  NA 141 139  K 3.7 3.9  CL 107 102  CO2 22 25  GLUCOSE 102* 101*  BUN 26* 31*  CREATININE 1.40* 1.38*  CALCIUM 9.7 10.3   Liver Function Tests: No results for input(s): AST, ALT, ALKPHOS, BILITOT, PROT, ALBUMIN in the last 168 hours. No results for input(s): LIPASE, AMYLASE in the last 168 hours. No results for input(s): AMMONIA in the last 168 hours. CBC: Recent Labs  Lab 12/02/18 0327  WBC 13.7*  NEUTROABS 10.0*  HGB 12.5  HCT 39.5  MCV 91.9  PLT 268   Cardiac Enzymes: No results for input(s): CKTOTAL, CKMB, CKMBINDEX, TROPONINI in the last 168 hours. BNP: Invalid input(s): POCBNP CBG: No results for input(s): GLUCAP in the last 168 hours. D-Dimer No results for input(s): DDIMER in the last 72 hours. Hgb A1c No results for input(s): HGBA1C in the last 72 hours. Lipid Profile No results for input(s): CHOL, HDL, LDLCALC, TRIG,  CHOLHDL, LDLDIRECT in the last 72 hours. Thyroid function studies No results for input(s): TSH, T4TOTAL, T3FREE, THYROIDAB in the last 72 hours.  Invalid input(s): FREET3 Anemia work up No results for input(s): VITAMINB12, FOLATE, FERRITIN, TIBC, IRON, RETICCTPCT in the last 72 hours. Urinalysis    Component Value Date/Time   COLORURINE YELLOW 11/30/2018 1232   APPEARANCEUR CLEAR 11/30/2018 1232   LABSPEC 1.025 11/30/2018 1232   PHURINE 5.5 11/30/2018 [place] 11/30/2018 1232   HGBUR NEGATIVE 11/30/2018 1232   BILIRUBINUR NEGATIVE 11/30/2018 1232   KETONESUR NEGATIVE 11/30/2018 1232   PROTEINUR NEGATIVE 11/30/2018 1232   NITRITE NEGATIVE 11/30/2018 1232   LEUKOCYTESUR NEGATIVE 11/30/2018 1232   Sepsis Labs Invalid input(s): PROCALCITONIN,  WBC,  LACTICIDVEN Microbiology Recent Results (from the past 240 hour(s))  SARS CORONAVIRUS 2 (TAT 6-12 HRS) Nasal Swab Aptima Multi Swab     Status: None   Collection Time: 11/30/18  2:01 PM   Specimen: Aptima Multi Swab; Nasal Swab  Result Value Ref Range Status   SARS  Coronavirus 2 NEGATIVE NEGATIVE Final    Comment: (NOTE) SARS-CoV-2 target nucleic acids are NOT DETECTED. The SARS-CoV-2 RNA is generally detectable in upper and lower respiratory specimens during the acute phase of infection. Negative results do not preclude SARS-CoV-2 infection, do not rule out co-infections with other pathogens, and should not be used as the sole basis for treatment or other patient management decisions. Negative results must be combined with clinical observations, patient history, and epidemiological information. The expected result is Negative. Fact Sheet for Patients: SugarRoll.be Fact Sheet for Healthcare Providers: https://www.woods-mathews.com/ This test is not yet approved or cleared by the [place] FDA and  has been authorized for detection and/or diagnosis of SARS-CoV-2 by FDA  under an Emergency Use Authorization (EUA). This EUA will remain  in effect (meaning this test can be used) for the duration of the COVID-19 declaration under Section 56 4(b)(1) of the Act, 21 U.S.C. section 360bbb-3(b)(1), unless the authorization is terminated or revoked sooner. Performed at [facility], [place] [address], [place], [place] [address]   MRSA PCR Screening     Status: None   Collection Time: 12/05/18  7:55 AM   Specimen: Nasal Mucosa; Nasopharyngeal  Result Value Ref Range Status   MRSA by PCR NEGATIVE NEGATIVE Final    Comment:        The GeneXpert MRSA Assay (FDA approved for NASAL specimens only), is one component of a comprehensive MRSA colonization surveillance program. It is not intended to diagnose MRSA infection nor to guide or monitor treatment for MRSA infections. Performed at [facility], [place] [address] [place], [place] [address]      Time coordinating discharge:  25 minutes  SIGNED:   Barb Merino, MD  [facility] 12/08/2018, 11:41 AM   Addendum: 12/09/2018, 10:37 AM Patient seen and examined.  Her discharge summary and plan reviewed and no changes needed.  She can be transferred to a skilled nursing facility today.

## 2018-12-08 NOTE — Care Management Important Message (Signed)
Important Message  Patient Details  Name: Sheila Young MRN: [id number] Date of Birth: 1950-10-31   Medicare Important Message Given:  Yes     Orbie Pyo 12/08/2018, 12:37 PM

## 2018-12-08 NOTE — Care Management Important Message (Signed)
Important Message  Patient Details  Name: Kasmira Lukowski MRN: [id number] Date of Birth: 24-Dec-1950   Medicare Important Message Given:  No  CORRECTION:  No IM given precauction in place.    Victoriano Campion 12/08/2018, 1:47 PM

## 2018-12-08 NOTE — Progress Notes (Signed)
Occupational Therapy Treatment Patient Details Name: Sheila Young MRN: [id number] DOB: [birth year] Today's Date: 12/08/2018    History of present illness 68 y.o. female admitted on 11/30/18 for confusion and L sided weakness.  Neuro consulted for concern of TIA/stroke.  Workup is still pending.  MRI revealed multifocal acute ischemia R hemispheere, single punctate focus of sicehmia in L hemisphere and abnormal R ICA flow.  Pt with significant PMH of stroke, seizure, radiation, HTN, and esophagus CA.   OT comments  Pt continues to require min cues for safety and sequencing due to impaired cognition. Pt turned on cold water and stated "I'm waiting for it to warm up" with no awareness that she had turned on the cold water.  Min cues for problem solving and noted incontinent Lt inattention during self-care tasks at sink.  Pt ambulated with min guard without AD and completed toileting at sit > stand level with min guard.  Pt will continue to require supervision/cues due to impaired cognition and decreased awareness of deficits.     Follow Up Recommendations  SNF    Equipment Recommendations  Other (comment)(defer to SNF)       Precautions / Restrictions Precautions Precautions: Fall Precaution Comments: unsteady on her feet, cues for safety       Mobility Bed Mobility Overal bed mobility: Needs Assistance Bed Mobility: Supine to Sit     Supine to sit: Supervision        Transfers Overall transfer level: Needs assistance Equipment used: None Transfers: Sit to/from Stand Sit to Stand: Min guard         General transfer comment: min guard for safety; increased time to gain balance upon standing; no physical assist needed        ADL either performed or assessed with clinical judgement   ADL Overall ADL's : Needs assistance/impaired Eating/Feeding: Set up;Sitting   Grooming: Wash/dry hands;Wash/dry face;Oral care;Supervision/safety;Standing   Upper Body Bathing: Min  guard;Standing           Lower Body Dressing: Min guard;Sit to/from stand   Toilet Transfer: Electronics engineer and Hygiene: Supervision/safety       Functional mobility during ADLs: Min guard General ADL Comments: pt overall limited 2/2 to cognitive issues and decreased activity tolerance, Supervision static standing but still requiring min guard for dynamic standing balance; therapist providing cues for safety during hygiene and LB dressing     Vision Patient Visual Report: No change from baseline Vision Assessment?: No apparent visual deficits          Cognition Arousal/Alertness: Awake/alert Behavior During Therapy: WFL for tasks assessed/performed Overall Cognitive Status: No family/caregiver present to determine baseline cognitive functioning Area of Impairment: Orientation;Memory;Safety/judgement;Problem solving                 Orientation Level: Disoriented to;Place;Situation(required increased time to state month, date, and year) Current Attention Level: Selective Memory: Decreased short-term memory Following Commands: Follows one step commands with increased time Safety/Judgement: Decreased awareness of safety Awareness: Intellectual Problem Solving: Slow processing;Difficulty sequencing;Requires verbal cues                     Pertinent Vitals/ Pain       Pain Assessment: No/denies pain         Frequency  Min 2X/week        Progress Toward Goals  OT Goals(current goals can now be found in the care plan section)  Progress towards OT goals:  Progressing toward goals  Acute Rehab OT Goals Patient Stated Goal: to go home OT Goal Formulation: With patient Time For Goal Achievement: 12/15/18 Potential to Achieve Goals: Good  Plan Discharge plan remains appropriate       AM-PAC OT "6 Clicks" Daily Activity     Outcome Measure   Help from another person eating meals?: None Help from  another person taking care of personal grooming?: A Little Help from another person toileting, which includes using toliet, bedpan, or urinal?: A Little Help from another person bathing (including washing, rinsing, drying)?: A Little Help from another person to put on and taking off regular upper body clothing?: A Little Help from another person to put on and taking off regular lower body clothing?: A Little 6 Click Score: 19    End of Session Equipment Utilized During Treatment: Gait belt  OT Visit Diagnosis: Unsteadiness on feet (R26.81);Other symptoms and signs involving cognitive function   Activity Tolerance Patient tolerated treatment well   Patient Left with call bell/phone within reach;in chair;with chair alarm set   Nurse Communication Mobility status        Time: [id number] OT Time Calculation (min): 25 min  Charges: OT General Charges $OT Visit: 1 Visit OT Treatments $Self Care/Home Management : 23-37 mins   Simonne Come, [phone] 12/08/2018, 10:26 AM

## 2018-12-08 NOTE — Progress Notes (Signed)
Nutrition Follow-up  DOCUMENTATION CODES:   Not applicable  INTERVENTION:  Continue Ensure Enlive po BID, each supplement provides 350 kcal and 20 grams of protein  Encourage adequate PO intake.  NUTRITION DIAGNOSIS:   Predicted suboptimal nutrient intake related to chronic illness(esophageal cancer; hx of CVA; seizures) as evidenced by other (comment)(history of mechanical dysphagia; recent admission for aspiration pneumonia; pending SLP eval); improving  GOAL:   Patient will meet greater than or equal to 90% of their needs; met  MONITOR:   PO intake, Weight trends, Supplement acceptance, Labs, I & O's  REASON FOR ASSESSMENT:   Consult Assessment of nutrition requirement/status  ASSESSMENT:   68 year old female with medical history significant of CVA, seizures, esophageal cancer, mechanical dysphagia, hyperaldosteronism chronic anemia, medication non-compliance, recent admission for new onset A-fib with RVR and aspiration pneumonia who presented with continued confusion over the past few days, left-sided weakness, facial droop, and AMS.  Meal completion has been 75-100%. Pt currently has Ensure ordered and has been consuming them. Recommend continuation of supplementation post discharge to aid in increased caloric and protein needs. Labs and medications reviewed.   Diet Order:   Diet Order            Diet - low sodium heart healthy        Diet Heart Room service appropriate? No; Fluid consistency: Thin  Diet effective now              EDUCATION NEEDS:   No education needs have been identified at this time  Skin:  Skin Assessment: Reviewed RN Assessment  Last BM:  8/31  Height:   Ht Readings from Last 1 Encounters:  11/30/18 _0  (1.676 m)    Weight:   Wt Readings from Last 1 Encounters:  11/30/18 81 kg    Ideal Body Weight:  61.4 kg  BMI:  Body mass index is 28.82 kg/m.  Estimated Nutritional Needs:   Kcal:  1850-2050  Protein:  90-105  grams  Fluid:  >/= 1.8 L/day    Corrin Parker, MS, RD, LDN Pager # [phone] After hours/ weekend pager # [phone]

## 2018-12-09 ENCOUNTER — Telehealth: Payer: Self-pay | Admitting: Adult Health

## 2018-12-09 NOTE — Progress Notes (Signed)
Patient seen and examined.  No overnight events.  She could not be transferred to a skilled nursing facility because of pending COVID-19 test results.  Results are back.  I reviewed her discharge summary, no changes needed and she is stable to transfer.

## 2018-12-09 NOTE — Telephone Encounter (Signed)
Scheduled a video visit with [facility] place went over doxy instructions with her and a email was sent to [email or web address]. She understands that although there may be some limitations with this type of visit, we will take all precautions to reduce any security or privacy concerns.  She understands that this will be treated like an in office visit and we will file with pt's insurance, and there may be a patient responsible charge related to this service. Call back number for facility is [phone].

## 2018-12-09 NOTE — Progress Notes (Signed)
Called RN from SNF and gave report. AVS given to PTAR and pt transported to SNF.

## 2018-12-09 NOTE — TOC Transition Note (Signed)
Transition of Care [facility]) - CM/SW Discharge Note   Patient Details  Name: Sheila Young MRN: [id number] Date of Birth: Aug 10, 1950  [facility] CM/SW Contact:  Geralynn Ochs, LCSW Phone Number: 12/09/2018, 11:05 AM   Clinical Narrative:   Nurse to call report to [phone], Room 406B    Final next level of care: Skilled Nursing Facility Barriers to Discharge: Barriers Resolved   Patient Goals and CMS Choice        Discharge Placement              Patient chooses bed at: [facility] Patient to be transferred to facility by: [place] Name of family member notified: Johnnie Patient and family notified of of transfer: 12/09/18  Discharge Plan and Services                                     Social Determinants of Health (SDOH) Interventions     Readmission Risk Interventions No flowsheet data found.

## 2019-01-04 ENCOUNTER — Encounter: Payer: Self-pay | Admitting: Adult Health

## 2019-01-04 ENCOUNTER — Inpatient Hospital Stay: Payer: Self-pay | Admitting: Adult Health

## 2019-01-04 NOTE — Progress Notes (Deleted)
[facility] [address] [place]. [place] [address] [phone]       HOSPITAL FOLLOW UP NOTE  Ms. Sheila Young Date of Birth:  1950-06-19 Medical Record Number:  [id number]   Reason for Referral:  hospital stroke follow up    CHIEF COMPLAINT:  No chief complaint on file.   HPI: Sheila Young being seen today for in office hospital follow-up regarding multiple embolic strokes on 0000000.  History obtained from *** and chart review. Reviewed all radiology images and labs personally.  Ms. Sheila Young is a 68 y.o. female with history of esophageal cancer s/p Taxol/carboplatin and radiation with continuing dysphagia, hypertension, hypercholesterolemia, atrial flutter on Eliquis, prior strokewith unclear residual deficits, seizures on Keppra, strong suspicion for noncompliance with medications b/c she lives by herself and has had memory issues over the past few months to years presented on 11/30/2018 with L sided weakness and altered mental status.  Stroke work-up revealed multiple small to punctate right MCA, MCA/PCA, MCA/ACA and single left MCA punctate infarcts embolic as evidenced on MRI secondary to right ICA occlusion as evidenced on MRA and known AF with noncompliance on Eliquis.  MRI also showed chronic left posterior MCA, left thalamic, right caudate head and bilateral seizures/BG infarcts.  2D echo normal EF.  LDL 121.  A1c 6.1.  Recommended continuation of Eliquis 5 mg twice daily as well as aspirin 81 mg for stroke prevention.  Questionable Keppra compliance for history of seizure disorder with EEG showing cortical dysfunction in the right temporal region due to stroke with mild to moderate encephalopathy and recommended ongoing use of Keppra 500 mg twice daily.  Likely vascular dementia with multiple old strokes and acute strokes with disorientation and cognitive impairment.  Recommended follow-up with neurology with possible initiation of Aricept and/or  Namenda.  HTN stable for long-term BP goal 130-150 due to right ICA occlusion.  Increase atorvastatin dose from 40 mg to 80 mg for HLD management.  Tobacco use with smoking cessation counseling provided.  Previously residing at [facility] independent living apartment and recommended discharge to SNF for ongoing therapy.      ROS:   14 system review of systems performed and negative with exception of ***  PMH:  Past Medical History:  Diagnosis Date   Anger    Esophagus cancer ([place]) 04/07/2014   High aldosterone (HCC)    History of noncompliance with medical treatment    Hyperaldosteronism ([place]) 10/17/2017   Hypercholesteremia    Hypertension    Hypokalemia    Mammogram abnormal    Mechanical dysphagia 04/07/2014   Radiation    54 Gy to distal esophagus   Seizures (HCC)    Stroke (HCC)     PSH:  Past Surgical History:  Procedure Laterality Date   IR REMOVAL TUN ACCESS W/ PORT W/O FL MOD SED  02/18/2017    Social History:  Social History   Socioeconomic History   Marital status: Single    Spouse name: Not on file   Number of children: Not on file   Years of education: Not on file   Highest education level: Not on file  Occupational History   Not on file  Social Needs   Financial resource strain: Not on file   Food insecurity    Worry: Not on file    Inability: Not on file   Transportation needs    Medical: Not on file    Non-medical: Not on file  Tobacco Use   Smoking status: Current  Every Day Smoker    Packs/day: 0.50    Years: 45.00    Pack years: 22.50    Types: Cigarettes    Start date: 06/05/1968   Smokeless tobacco: Never Used  Substance and Sexual Activity   Alcohol use: No    Alcohol/week: 0.0 standard drinks   Drug use: No   Sexual activity: Not on file  Lifestyle   Physical activity    Days per week: Not on file    Minutes per session: Not on file   Stress: Not on file  Relationships   Social connections    Talks  on phone: Not on file    Gets together: Not on file    Attends religious service: Not on file    Active member of club or organization: Not on file    Attends meetings of clubs or organizations: Not on file    Relationship status: Not on file   Intimate partner violence    Fear of current or ex partner: Not on file    Emotionally abused: Not on file    Physically abused: Not on file    Forced sexual activity: Not on file  Other Topics Concern   Not on file  Social History Narrative   Not on file    Family History:  Family History  Problem Relation Age of Onset   GI Bleed Neg Hx    GI Disease Neg Hx    Stroke Neg Hx     Medications:   Current Outpatient Medications on File Prior to Visit  Medication Sig Dispense Refill   amLODipine (NORVASC) 10 MG tablet Take 0.5 tablets (5 mg total) by mouth daily. (Patient taking differently: Take 10 mg by mouth daily. ) 30 tablet 6   apixaban (ELIQUIS) 5 MG TABS tablet Take 1 tablet (5 mg total) by mouth 2 (two) times daily. 60 tablet 0   aspirin EC 81 MG EC tablet Take 1 tablet (81 mg total) by mouth daily.     atorvastatin (LIPITOR) 80 MG tablet Take 1 tablet (80 mg total) by mouth at bedtime.     Cholecalciferol (VITAMIN D-3) 1000 units CAPS Take 2,000 Units by mouth daily.     FLUoxetine (PROZAC) 10 MG capsule Take 1 capsule (10 mg total) by mouth daily. 30 capsule 4   levETIRAcetam (KEPPRA) 500 MG tablet Take 500 mg by mouth 2 (two) times daily.      magnesium oxide (MAG-OX) 400 (241.3 Mg) MG tablet Take 400 mg by mouth 2 (two) times daily.     metoprolol succinate (TOPROL-XL) 25 MG 24 hr tablet Take 3 tablets (75 mg total) by mouth daily. Take with or immediately following a meal. (Patient taking differently: Take 50 mg by mouth daily. Take with or immediately following a meal.) 30 tablet 0   pantoprazole (PROTONIX) 40 MG tablet Take 1 tablet (40 mg total) by mouth daily. 30 tablet 4   spironolactone (ALDACTONE) 50 MG  tablet Take 1 tablet (50 mg total) by mouth daily. 30 tablet 0   No current facility-administered medications on file prior to visit.     Allergies:  No Known Allergies   Physical Exam  There were no vitals filed for this visit. There is no height or weight on file to calculate BMI. No exam data present  Depression screen [facility] 2/9 07/04/2014  Decreased Interest 0  Down, Depressed, Hopeless 0  PHQ - 2 Score 0     General: well developed, well nourished,  seated, in no evident distress Head: head normocephalic and atraumatic.   Neck: supple with no carotid or supraclavicular bruits Cardiovascular: regular rate and rhythm, no murmurs Musculoskeletal: no deformity Skin:  no rash/petichiae Vascular:  Normal pulses all extremities   Neurologic Exam Mental Status: Awake and fully alert. Oriented to place and time. Recent and remote memory intact. Attention span, concentration and fund of knowledge appropriate. Mood and affect appropriate.  Cranial Nerves: Fundoscopic exam reveals sharp disc margins. Pupils equal, briskly reactive to light. Extraocular movements full without nystagmus. Visual fields full to confrontation. Hearing intact. Facial sensation intact. Face, tongue, palate moves normally and symmetrically.  Motor: Normal bulk and tone. Normal strength in all tested extremity muscles. Sensory.: intact to touch , pinprick , position and vibratory sensation.  Coordination: Rapid alternating movements normal in all extremities. Finger-to-nose and heel-to-shin performed accurately bilaterally. Gait and Station: Arises from chair without difficulty. Stance is normal. Gait demonstrates normal stride length and balance Reflexes: 1+ and symmetric. Toes downgoing.     NIHSS  *** Modified Rankin  *** CHA2DS2-VASc 6 HAS-BLED 3   Diagnostic Data (Labs, Imaging, Testing)  CT HEAD WO CONTRAST 11/30/2018 IMPRESSION: No acute abnormality. Multiple remote infarcts, unchanged.  CT  ANGIO HEAD W OR WO CONTRAST CT ANGIO NECK W OR WO CONTRAST ***  MR BRAIN WO CONTRAST 12/01/2018 IMPRESSION: 1. Multifocal acute ischemia, predominantly within the right hemisphere, within multiple vascular territories, possibly embolic. 2. Single punctate focus of ischemia within the left hemisphere along the left precentral gyrus. 3. Abnormal right ICA flow void at the skull base which may indicate slow flow or occlusion. CTA or MRA may be helpful for further characterization. 4. Multiple old infarcts and findings of chronic small vessel ischemia.  MR MRA HEAD  MR MRA NECK 12/01/2018 IMPRESSION: 1. No flow visible in the right internal carotid artery from the bifurcation to the ICA terminus. 2. Normal flow in the left carotid artery through the ICA terminus. 3. Poor visualization of the posterior circulation on the MRA circle-of-Willis. The vessels appear hypoplastic. 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 4. 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  ECHOCARDIOGRAM 12/01/2018 IMPRESSIONS  1. The left ventricle has normal systolic function, with an ejection fraction of 55-60%. The cavity size was normal. There is mildly increased left ventricular wall thickness. Left ventricular diastolic Doppler parameters are consistent with  pseudonormalization. No evidence of left ventricular regional wall motion abnormalities.  2. The right ventricle has normal systolic function. The cavity was normal. There is no increase in right ventricular wall thickness.  3. Mitral valve regurgitation is mild to moderate by color flow Doppler. No evidence of mitral valve stenosis.  4. The aortic valve is tricuspid. Mild calcification of the aortic valve. Aortic valve regurgitation is trivial by color flow Doppler. No stenosis of the aortic valve.  5. The aorta is normal unless otherwise noted.  6. The aortic root is normal in size and structure.  7. Normal IVC size. PA systolic pressure 34  mmHg.  EEG 12/01/2018 IMPRESSION: This study is suggestive of cortical dysfunction in the right temporal region likely secondary to underlying stroke.  There was also mild to moderate diffuse encephalopathy. No seizures or epileptiform discharges were seen throughout the recording.    ASSESSMENT: Sheila Young is a 68 y.o. year old female presented with left-sided weakness and AMS on 11/30/2018 with stroke work-up revealing multiple small punctate right MCA, MCA/PCA, MCA/ACA and single left MCA punctate infarcts embolic pattern secondary to right  ICA occlusion and known AF with Eliquis noncompliance. Vascular risk factors include seizure history on Keppra, HTN, HLD, atrial fibrillation on Eliquis, prior strokes, vascular dementia and medication noncompliance.     PLAN:  1. Multiple embolic strokes: Continue aspirin 81 mg daily and Eliquis (apixaban) daily  and atorvastatin for secondary stroke prevention. Maintain strict control of hypertension with blood pressure goal below 130/90, diabetes with hemoglobin A1c goal below 6.5% and cholesterol with LDL cholesterol (bad cholesterol) goal below 70 mg/dL.  I also advised the patient to eat a healthy diet with plenty of whole grains, cereals, fruits and vegetables, exercise regularly with at least 30 minutes of continuous activity daily and maintain ideal body weight. 2. Atrial fibrillation: Continuation of Eliquis for secondary stroke prevention 3. Right ICA occlusion: Continuation of aspirin 81 mg daily and Eliquis as well as statin.  Discussion regarding importance of medication compliance 4. Vascular dementia: Likely longstanding secondary to multiple strokes 5. Seizure disorder: Continuation of Keppra 500 mg twice daily for seizure prophylaxis 6. HTN: Advised to continue current treatment regimen.  Today's BP ***.  Advised to continue to monitor at home along with continued follow-up with PCP for management 7. HLD: Advised to continue current  treatment regimen along with continued follow-up with PCP for future prescribing and monitoring of lipid panel    Follow up in *** or call earlier if needed   Greater than 50% of time during this 45 minute visit was spent on counseling, explanation of diagnosis of multiple embolic strokes, reviewing risk factor management of medication noncompliance, atrial fibrillation, right ICA occlusion, vascular dementia, seizure disorder, HTN and HLD, planning of further management along with potential future management, and discussion with patient and family answering all questions.    Frann Rider, AGNP-BC  [facility] [address] [place], [place] [address]  Phone [phone] Fax [phone] Note: This document was prepared with digital dictation and possible smart phrase technology. Any transcriptional errors that result from this process are unintentional.

## 2019-11-24 ENCOUNTER — Inpatient Hospital Stay: Payer: Medicare Other | Attending: Hematology & Oncology

## 2019-11-24 ENCOUNTER — Ambulatory Visit: Payer: Medicare Other | Admitting: Hematology & Oncology

## 2020-05-14 IMAGING — CT CT ABD-PELV W/ CM
2 of 5 series · 16 of 46 positions shown, 18 images · IV contrast (APPLIED)
Comparison: PET-CT 12/15/2014

CLINICAL DATA: Abdominal pain, diverticulitis suspected.

EXAM:
CT ABDOMEN AND PELVIS WITH CONTRAST
TECHNIQUE: Multidetector CT imaging of the abdomen and pelvis was performed
using the standard protocol following bolus administration of
intravenous contrast.
CONTRAST:  100mL [id number] IOPAMIDOL ([id number]) INJECTION 61%

[Series 2: axial st · axial · 0.76mm/px · z∈[+521,+901]mm · 13 of 86 slices shown, 15 images]
[im 5/86  soft-tissue]
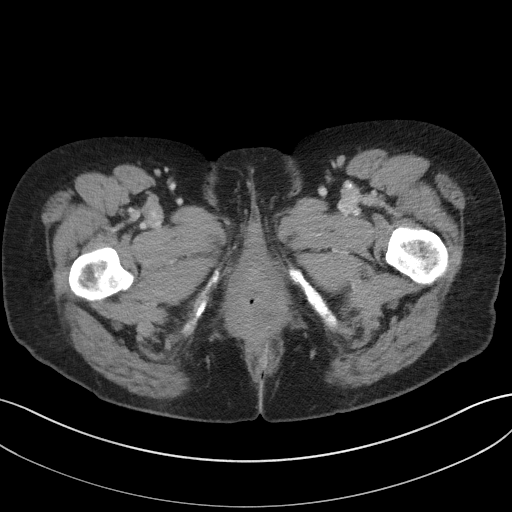
[im 5/86  bone]
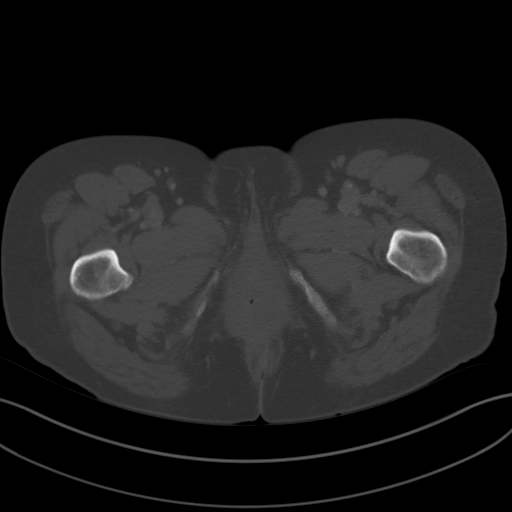
[im 14/86  soft-tissue]
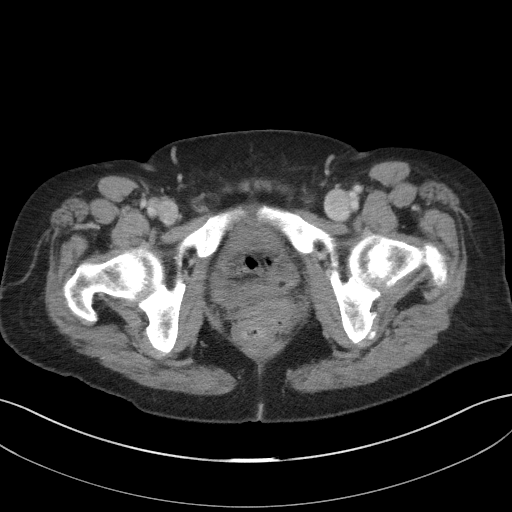
[im 18/86  soft-tissue]
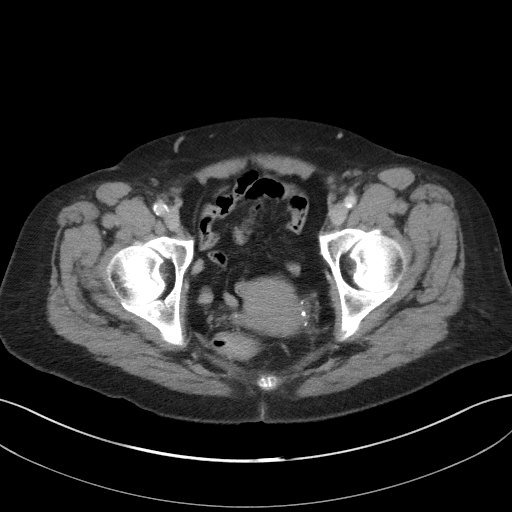
[im 23/86  soft-tissue]
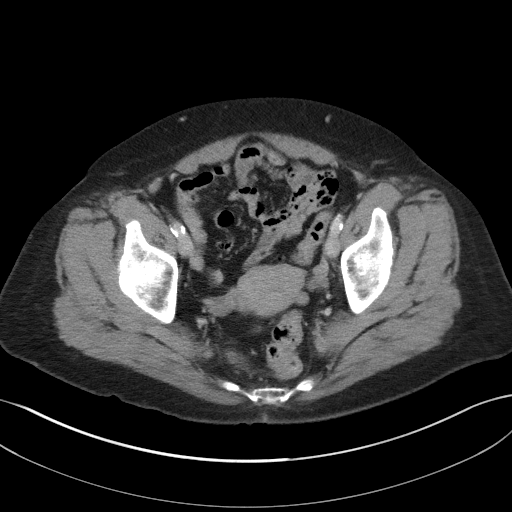
[im 32/86  soft-tissue]
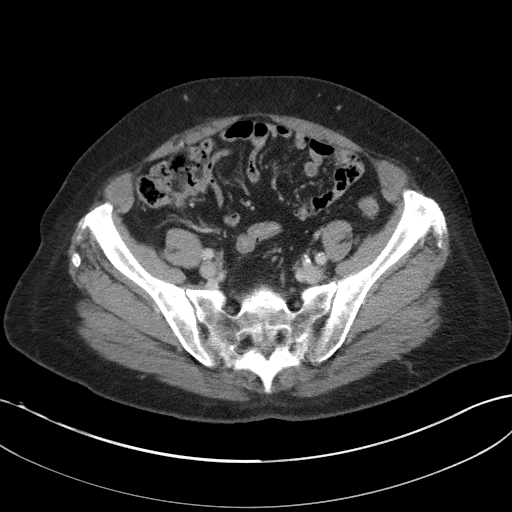
[im 36/86  soft-tissue]
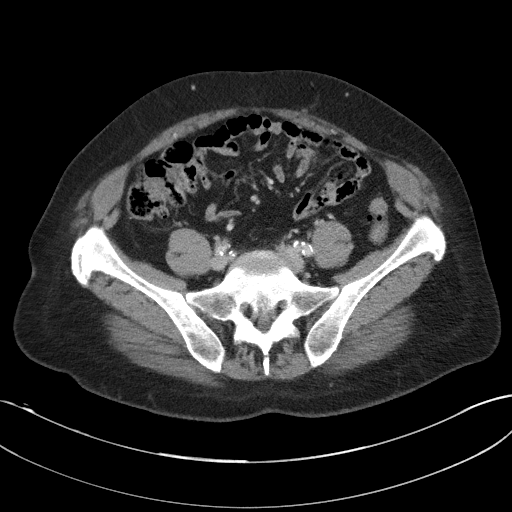
[im 45/86  soft-tissue]
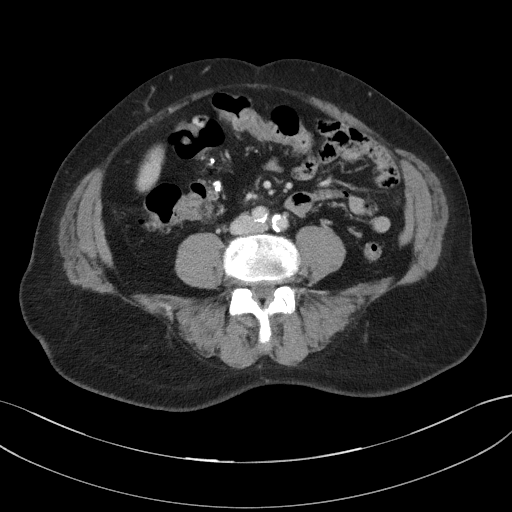
[im 50/86  soft-tissue]
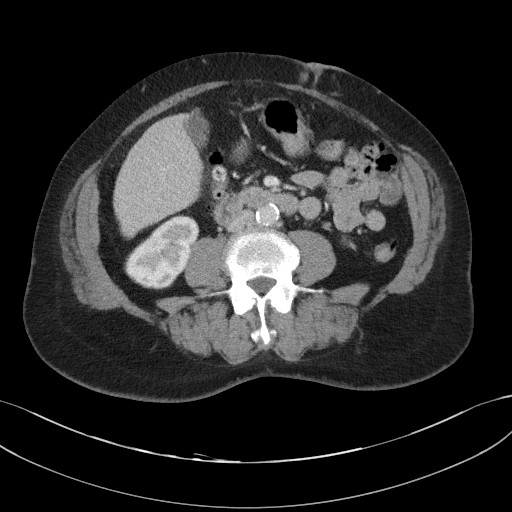
[im 54/86  soft-tissue]
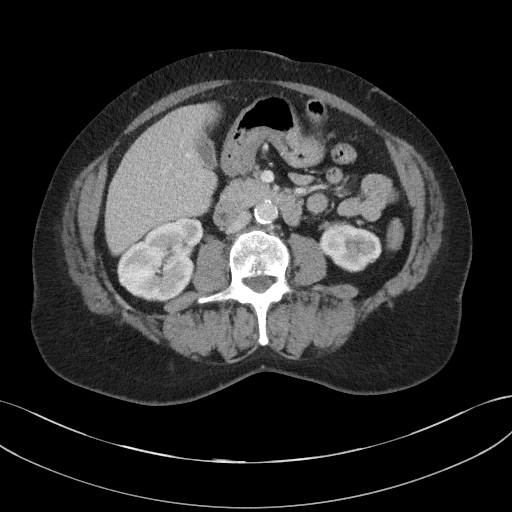
[im 54/86  bone]
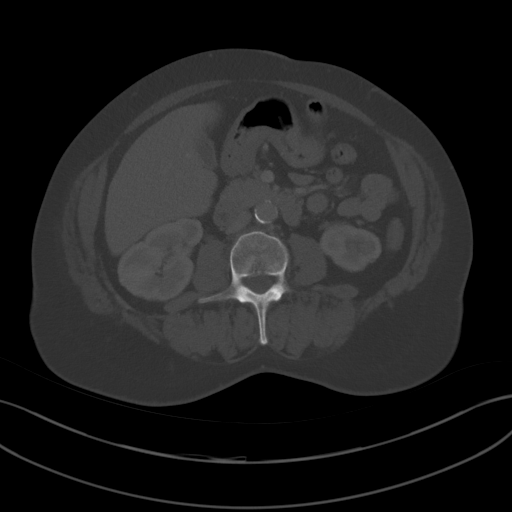
[im 63/86  soft-tissue]
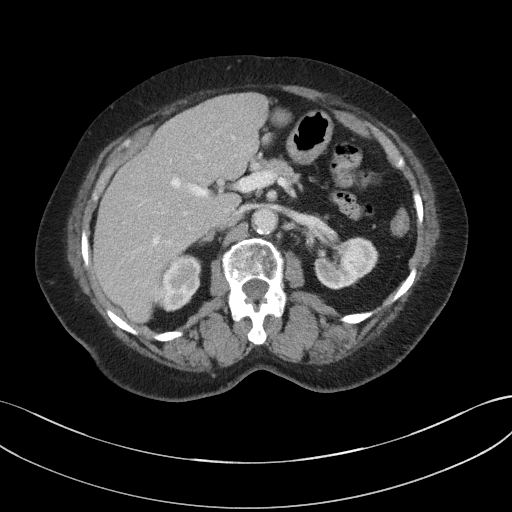
[im 68/86  soft-tissue]
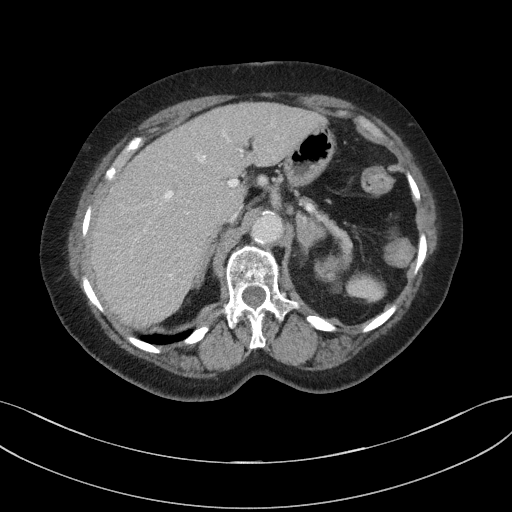
[im 72/86  soft-tissue]
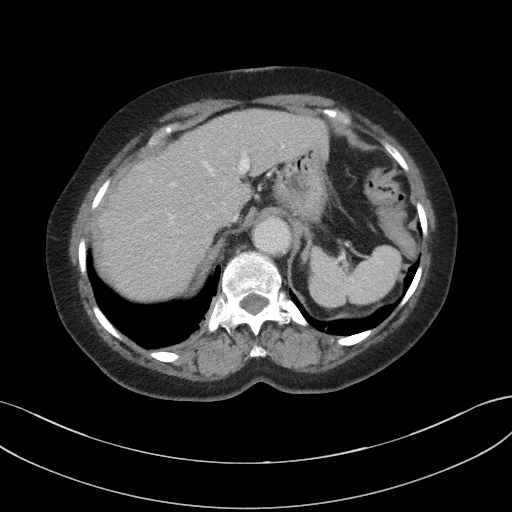
[im 81/86  soft-tissue]
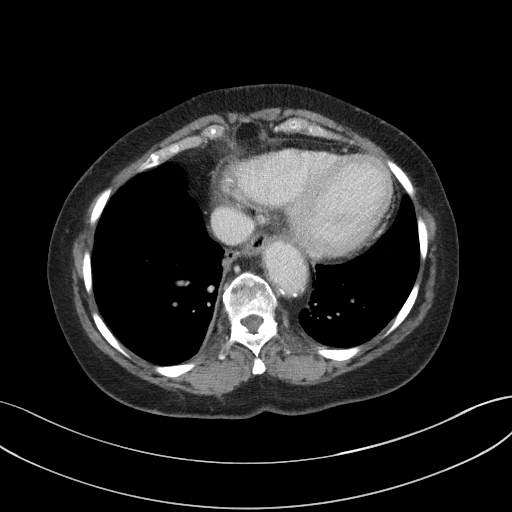

[Series 5: coronal st · coronal · 0.73mm/px · 3 of 88 slices shown]
[im 30/88  soft-tissue]
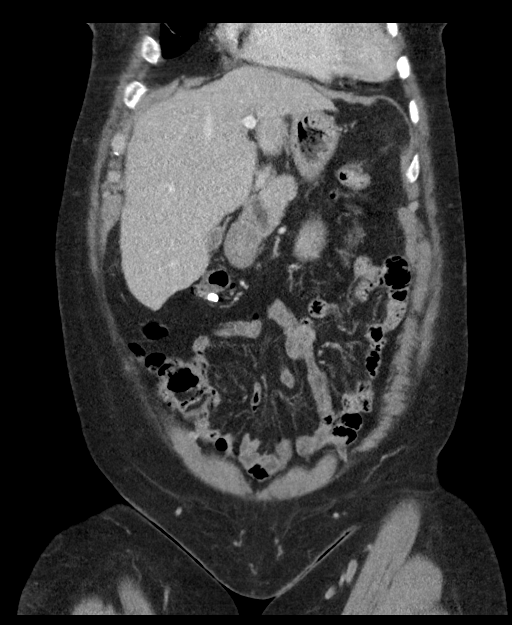
[im 39/88  soft-tissue]
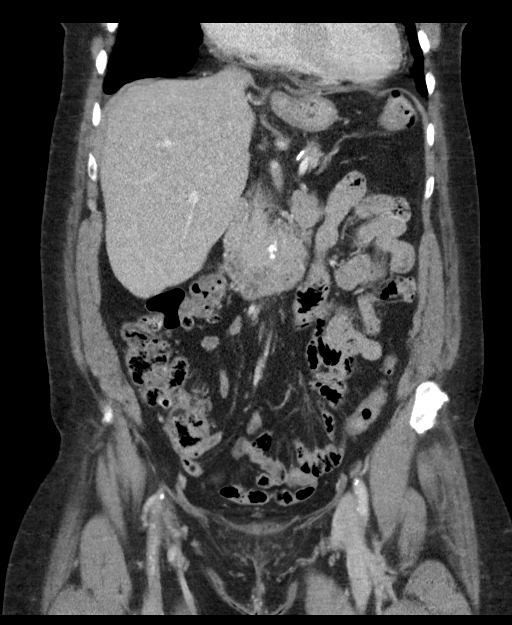
[im 49/88  soft-tissue]
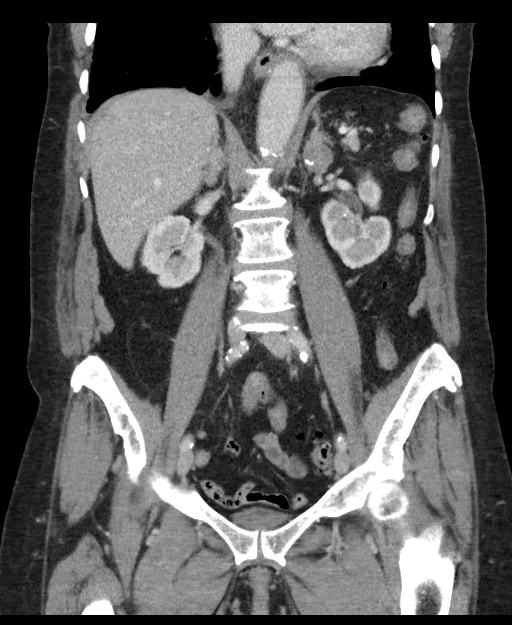

[16 of 46 positions shown; findings below may reference images not displayed]

FINDINGS: Lower chest: Paramediastinal pleural thickening and lung
reticulation likely from prior esophageal cancer treatment.
Cardiomegaly. Atherosclerosis. No masslike finding.

Hepatobiliary: Lobulated liver surface, suspect cirrhosis. Negative
for mass.No evidence of biliary obstruction or stone.

Pancreas: Unremarkable.

Spleen: Unremarkable.

Adrenals/Urinary Tract: Somewhat heterogeneous left adrenal mass
measuring 3 cm, but essentially stable from 9518 and thus attributed
to adenoma. Atrophy of the malrotated left kidney. 3 mm right renal
stone. Left renal hilar calcifications appear vascular. 13 mm left
renal cyst. Decompressed urinary bladder that is otherwise
unremarkable.

Stomach/Bowel: Extensive colonic diverticulosis, greatest along the
proximal and sigmoid colon. No inflammatory changes or obstruction.
No appendicitis. Scarring related to prior percutaneous gastrostomy.
Incidental submucosal lipoma at the hepatic flexure, 16 mm in
maximal span.

Vascular/Lymphatic: Extensive atherosclerotic plaque of the aorta
and visceral branches. Probable right SFA occlusion, certainty
limited by contrast timing. No mass or adenopathy.

Reproductive:2.7 cm fundal fibroid which is likely regressed from
9518.

Other: No ascites or pneumoperitoneum.

Musculoskeletal: No acute abnormalities.
IMPRESSION: 1. No acute finding.
2. Liver lobulation concerning for cirrhosis. Please correlate for
risk factors.
3. Colonic diverticulosis.
4. Aortic Atherosclerosis ([id number]), that is extensive. Probable
occlusion of the right SFA.

## 2022-08-07 DEATH — deceased
